# Patient Record
Sex: Male | Born: 1965 | Race: White | Hispanic: No | Marital: Single | Smoking: Never smoker
Health system: Southern US, Academic
[De-identification: ages and names within clinical notes are randomized; demographics above are authoritative.]

## PROBLEM LIST (undated history)

## (undated) DIAGNOSIS — K449 Diaphragmatic hernia without obstruction or gangrene: Secondary | ICD-10-CM

## (undated) DIAGNOSIS — H548 Legal blindness, as defined in USA: Secondary | ICD-10-CM

## (undated) DIAGNOSIS — N183 Chronic kidney disease, stage 3 unspecified: Secondary | ICD-10-CM

## (undated) DIAGNOSIS — K279 Peptic ulcer, site unspecified, unspecified as acute or chronic, without hemorrhage or perforation: Secondary | ICD-10-CM

## (undated) DIAGNOSIS — B379 Candidiasis, unspecified: Secondary | ICD-10-CM

## (undated) DIAGNOSIS — E291 Testicular hypofunction: Secondary | ICD-10-CM

## (undated) DIAGNOSIS — E1142 Type 2 diabetes mellitus with diabetic polyneuropathy: Secondary | ICD-10-CM

## (undated) DIAGNOSIS — E559 Vitamin D deficiency, unspecified: Secondary | ICD-10-CM

## (undated) DIAGNOSIS — I4891 Unspecified atrial fibrillation: Secondary | ICD-10-CM

## (undated) DIAGNOSIS — K635 Polyp of colon: Secondary | ICD-10-CM

## (undated) DIAGNOSIS — D649 Anemia, unspecified: Secondary | ICD-10-CM

## (undated) DIAGNOSIS — F17200 Nicotine dependence, unspecified, uncomplicated: Secondary | ICD-10-CM

## (undated) DIAGNOSIS — Z940 Kidney transplant status: Secondary | ICD-10-CM

## (undated) DIAGNOSIS — E119 Type 2 diabetes mellitus without complications: Secondary | ICD-10-CM

## (undated) DIAGNOSIS — K219 Gastro-esophageal reflux disease without esophagitis: Secondary | ICD-10-CM

## (undated) DIAGNOSIS — N281 Cyst of kidney, acquired: Secondary | ICD-10-CM

## (undated) DIAGNOSIS — G4733 Obstructive sleep apnea (adult) (pediatric): Secondary | ICD-10-CM

## (undated) DIAGNOSIS — E782 Mixed hyperlipidemia: Secondary | ICD-10-CM

## (undated) DIAGNOSIS — B009 Herpesviral infection, unspecified: Secondary | ICD-10-CM

## (undated) DIAGNOSIS — R972 Elevated prostate specific antigen [PSA]: Secondary | ICD-10-CM

## (undated) DIAGNOSIS — N529 Male erectile dysfunction, unspecified: Secondary | ICD-10-CM

## (undated) DIAGNOSIS — H409 Unspecified glaucoma: Secondary | ICD-10-CM

## (undated) DIAGNOSIS — E669 Obesity, unspecified: Secondary | ICD-10-CM

## (undated) HISTORY — PX: DEBRIDEMENT  FOOT: SUR387

## (undated) HISTORY — PX: COLONOSCOPY: SHX174

## (undated) HISTORY — PX: FOOT SURGERY: SHX648

## (undated) HISTORY — DX: Obesity, unspecified: E66.9

## (undated) HISTORY — DX: Herpesviral infection, unspecified: B00.9

## (undated) HISTORY — DX: Vitamin D deficiency, unspecified: E55.9

## (undated) HISTORY — DX: Type 2 diabetes mellitus with diabetic polyneuropathy: E11.42

## (undated) HISTORY — DX: Testicular hypofunction: E29.1

## (undated) HISTORY — DX: Kidney transplant status: Z94.0

## (undated) HISTORY — DX: Obstructive sleep apnea (adult) (pediatric): G47.33

## (undated) HISTORY — PX: HX RENAL TRANSPLANT: SHX64

## (undated) HISTORY — DX: Mixed hyperlipidemia: E78.2

## (undated) HISTORY — DX: Cyst of kidney, acquired: N28.1

## (undated) HISTORY — PX: HX HERNIA REPAIR: SHX51

## (undated) HISTORY — DX: Elevated prostate specific antigen (PSA): R97.20

## (undated) HISTORY — DX: Polyp of colon: K63.5

## (undated) HISTORY — DX: Unspecified glaucoma: H40.9

## (undated) HISTORY — PX: OTHER SURGICAL HISTORY: SHX170

## (undated) HISTORY — PX: HX UPPER ENDOSCOPY: 2100001144

## (undated) HISTORY — DX: Unspecified atrial fibrillation: I48.91

## (undated) HISTORY — DX: Chronic kidney disease, stage 3 unspecified: N18.30

## (undated) HISTORY — DX: Anemia, unspecified: D64.9

## (undated) HISTORY — DX: Legal blindness, as defined in USA: H54.8

## (undated) HISTORY — PX: SHOULDER SURGERY: SHX246

## (undated) HISTORY — DX: Nicotine dependence, unspecified, uncomplicated: F17.200

## (undated) HISTORY — PX: SURGERY OF LIP: SUR1315

## (undated) HISTORY — PX: EYE SURGERY: SHX253

## (undated) HISTORY — DX: Diaphragmatic hernia without obstruction or gangrene: K44.9

## (undated) HISTORY — DX: Male erectile dysfunction, unspecified: N52.9

## (undated) HISTORY — DX: Peptic ulcer, site unspecified, unspecified as acute or chronic, without hemorrhage or perforation: K27.9

## (undated) HISTORY — PX: KIDNEY SURGERY: SHX687

## (undated) HISTORY — DX: Type 2 diabetes mellitus without complications: E11.9

## (undated) HISTORY — DX: Candidiasis, unspecified: B37.9

## (undated) HISTORY — DX: Hypomagnesemia: E83.42

## (undated) HISTORY — DX: Gastro-esophageal reflux disease without esophagitis: K21.9

---

## 1993-01-31 ENCOUNTER — Other Ambulatory Visit (HOSPITAL_COMMUNITY): Payer: Self-pay

## 2014-07-13 IMAGING — US US ABDOMEN COMPLETE
1 series · 13 of 25 positions shown · non-contrast
Comparison: None.

Exam:   
Ultrasound abdomen complete
INDICATION: Biliary colic, history of renal and pancreas transplant.

[Series 1: us abdomen complete · oblique · 0.43mm/px · 13 of 60 slices shown]
[im 1/60]
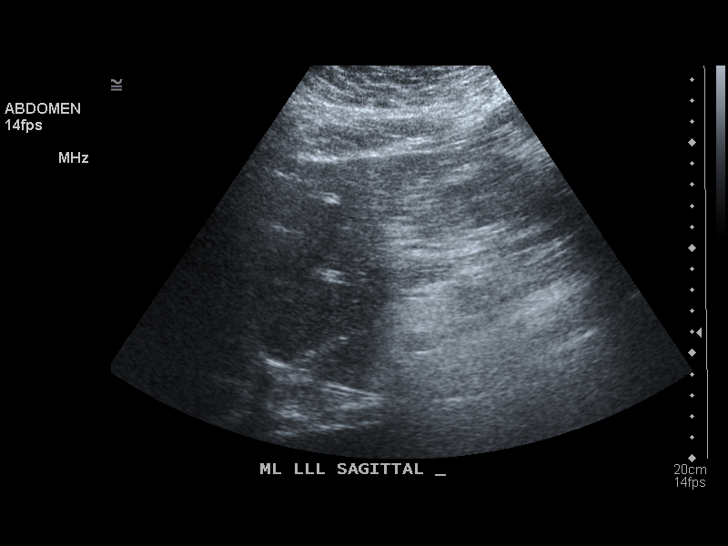
[im 5/60]
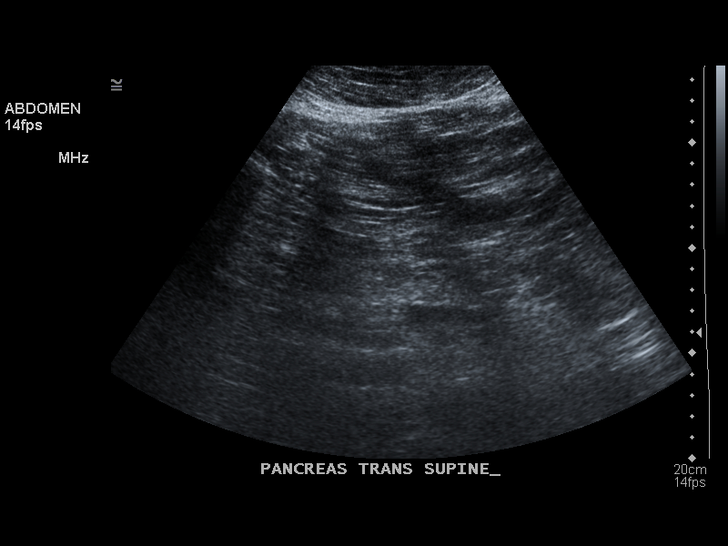
[im 10/60]
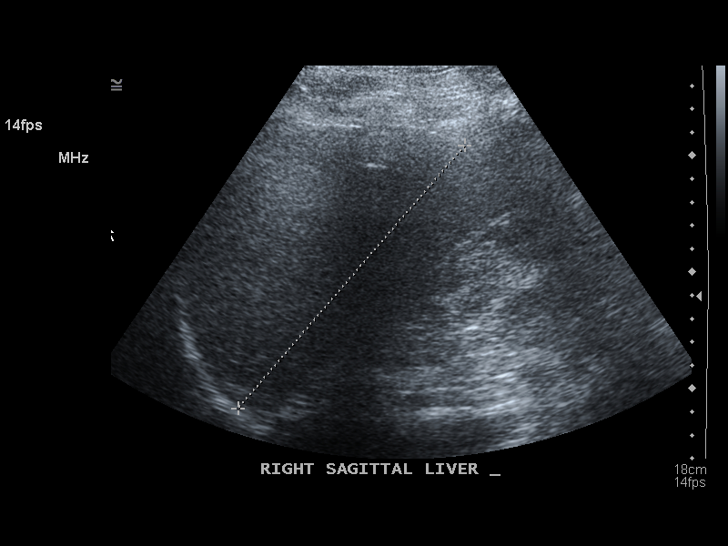
[im 15/60]
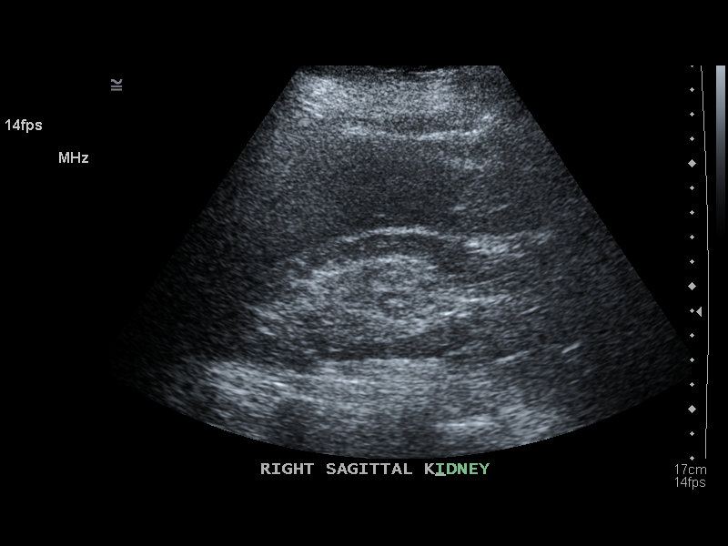
[im 20/60]
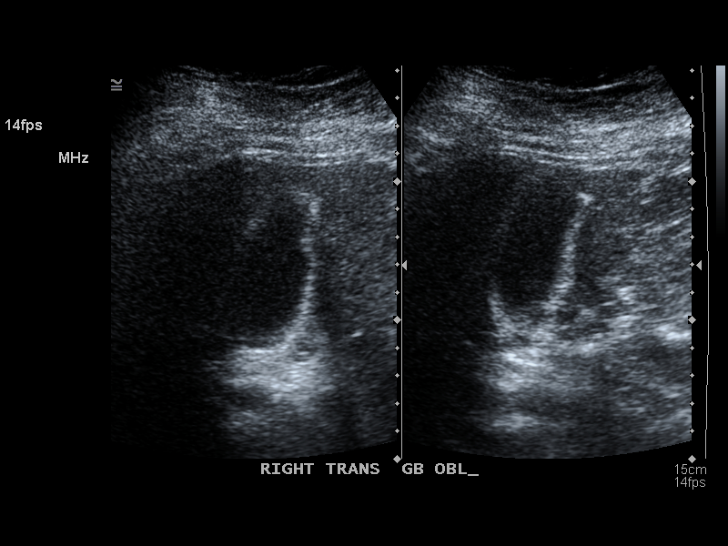
[im 25/60]
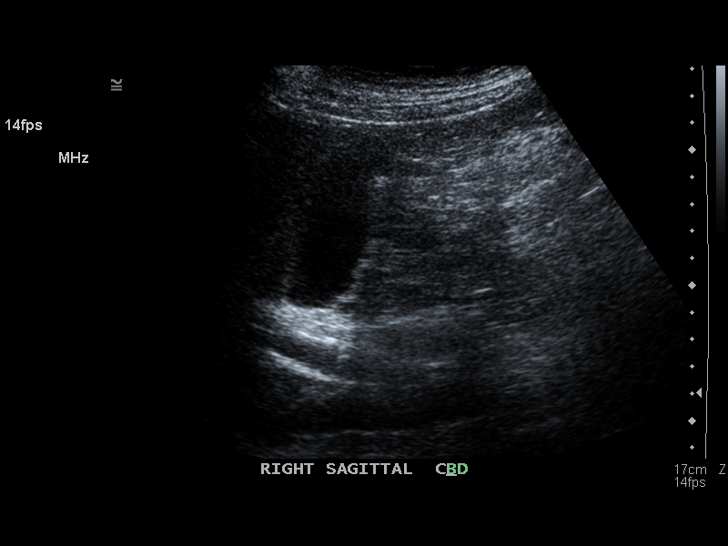
[im 30/60]
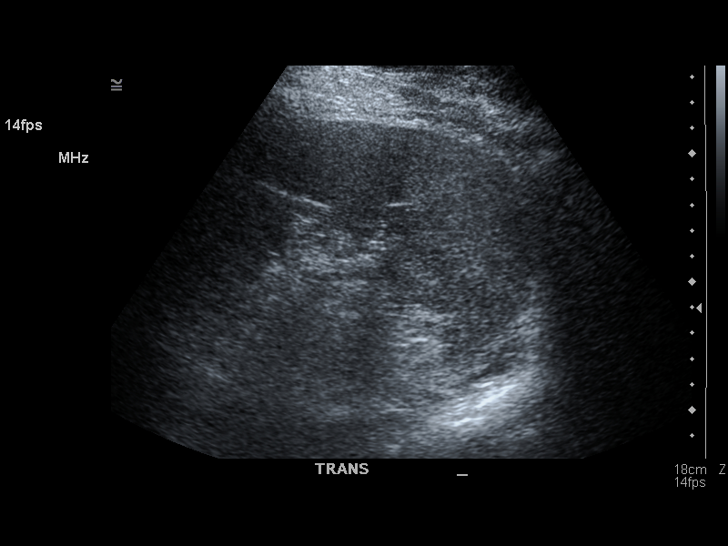
[im 35/60]
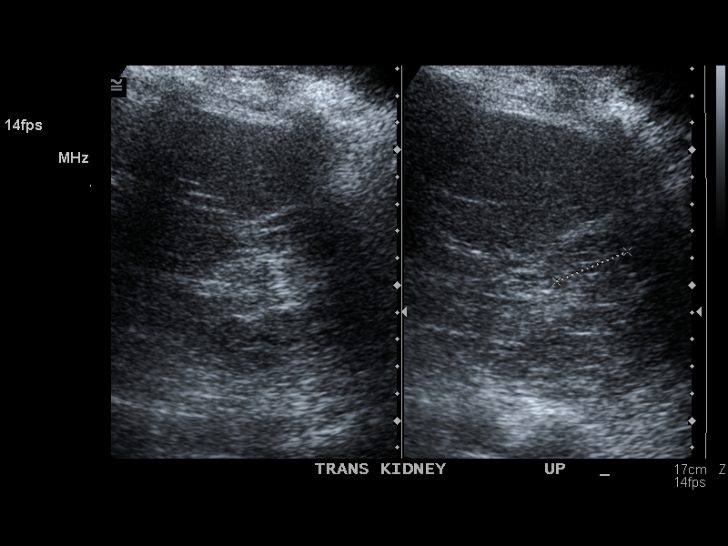
[im 40/60]
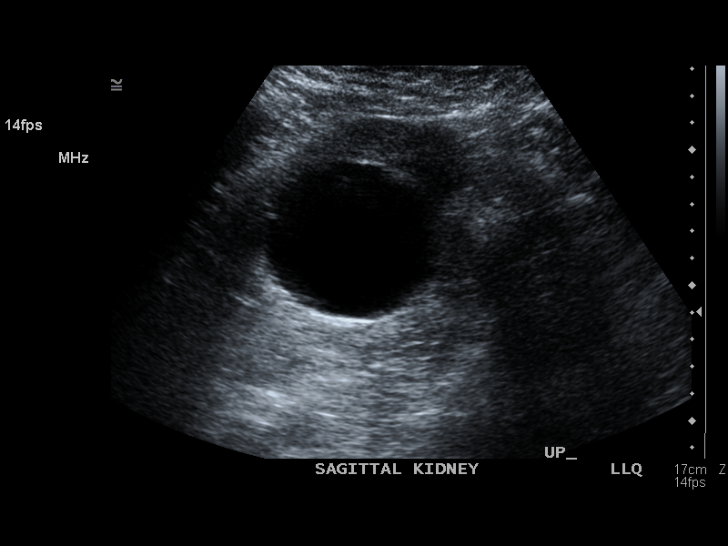
[im 45/60]
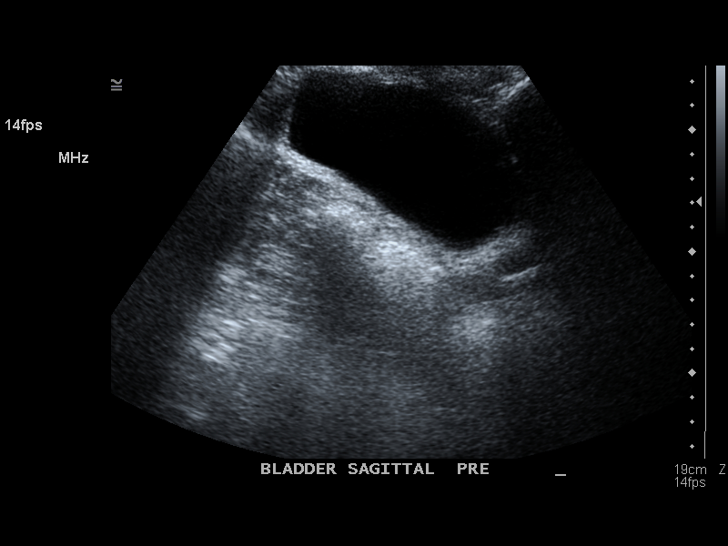
[im 50/60]
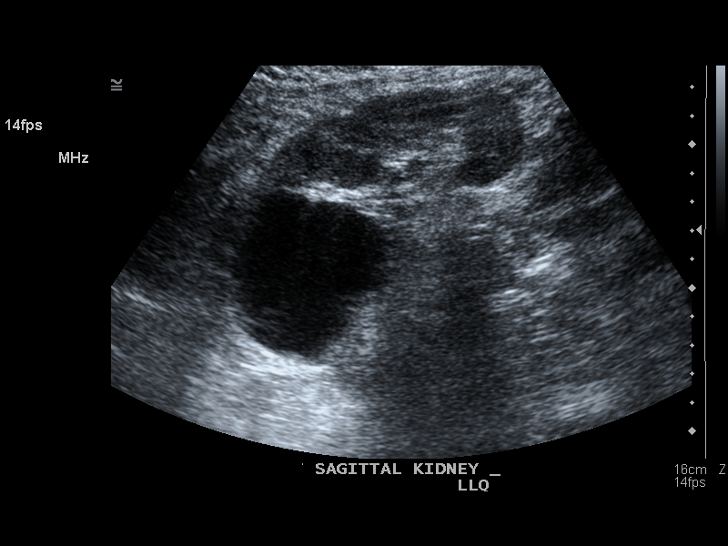
[im 55/60]
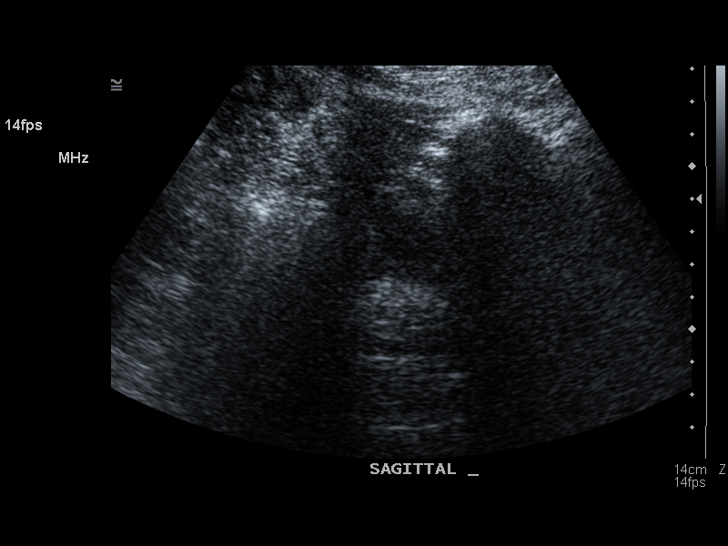
[im 60/60]
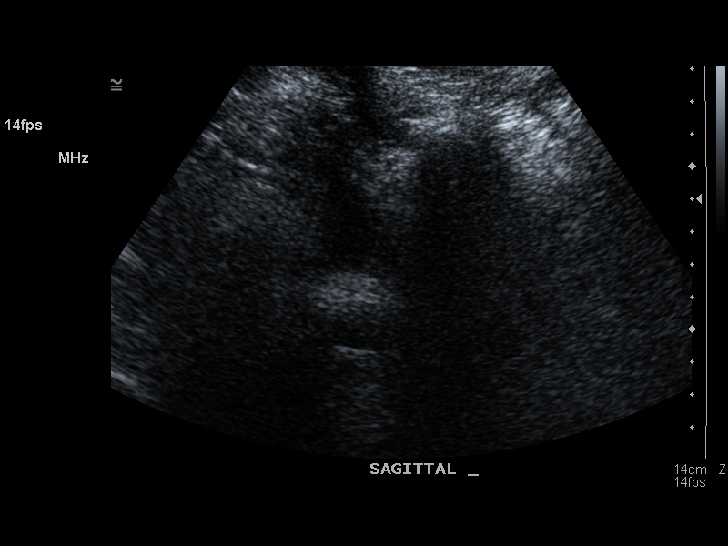

[13 of 25 positions shown; findings below may reference images not displayed]

FINDINGS: Liver is echogenic compatible with fatty infiltration. This limits evaluation for a focal hepatic mass. There is no intra or extra hepatic biliary ductal dilatation. Common bile duct measures 4 mm. No sludge or shadowing calculi are seen within the gallbladder. There is no gallbladder wall thickening or pericholecystic fluid. 
Pancreas and abdominal aorta are not well visualized due to artifact from overlying bowel gas. 
Native kidneys are echogenic consistent with medical renal disease. The left kidney is also atrophic measuring 5 cm. 
There is a transplanted kidney within the left lower quadrant measuring 13 cm. A 6.5 cm cyst is also noted within the upper pole of the transplanted kidney. 
Spleen is prominent in size measuring 13.5 cm. 
Urinary bladder is partially distended and grossly unremarkable. There is no significant post-void residual. There is no ascites.
IMPRESSION: 1.
Fatty liver. 
2.
No evidence of cholelithiasis or acute cholecystitis. 
3.
Pancreas and abdominal aorta are incompletely visualized due to artifact from overlying bowel gas. 

4.
Echogenic native kidneys consistent with medical renal disease. Left kidney is also atrophic. 
5.
Left lower quadrant transplanted kidney with a 6.5 cm upper pole cyst.

## 2021-07-15 ENCOUNTER — Encounter (RURAL_HEALTH_CENTER): Payer: Self-pay | Admitting: Internal Medicine

## 2021-07-15 DIAGNOSIS — N183 Chronic kidney disease, stage 3 unspecified (CMS HCC): Secondary | ICD-10-CM | POA: Insufficient documentation

## 2021-07-15 DIAGNOSIS — D649 Anemia, unspecified: Secondary | ICD-10-CM | POA: Insufficient documentation

## 2021-07-15 DIAGNOSIS — B379 Candidiasis, unspecified: Secondary | ICD-10-CM | POA: Insufficient documentation

## 2021-07-15 DIAGNOSIS — K219 Gastro-esophageal reflux disease without esophagitis: Secondary | ICD-10-CM | POA: Insufficient documentation

## 2021-07-15 DIAGNOSIS — Z940 Kidney transplant status: Secondary | ICD-10-CM | POA: Insufficient documentation

## 2021-07-15 DIAGNOSIS — E782 Mixed hyperlipidemia: Secondary | ICD-10-CM | POA: Insufficient documentation

## 2021-07-15 DIAGNOSIS — I4891 Unspecified atrial fibrillation: Secondary | ICD-10-CM | POA: Insufficient documentation

## 2021-07-15 DIAGNOSIS — G4733 Obstructive sleep apnea (adult) (pediatric): Secondary | ICD-10-CM | POA: Insufficient documentation

## 2021-07-15 DIAGNOSIS — N529 Male erectile dysfunction, unspecified: Secondary | ICD-10-CM | POA: Insufficient documentation

## 2021-07-15 DIAGNOSIS — E291 Testicular hypofunction: Secondary | ICD-10-CM | POA: Insufficient documentation

## 2021-07-15 DIAGNOSIS — E1142 Type 2 diabetes mellitus with diabetic polyneuropathy: Secondary | ICD-10-CM | POA: Insufficient documentation

## 2021-07-15 DIAGNOSIS — E119 Type 2 diabetes mellitus without complications: Secondary | ICD-10-CM | POA: Insufficient documentation

## 2021-07-15 NOTE — Progress Notes (Signed)
an affiliate of [facility]  Patient:  Martin Porter, Martin Porter #: [id number]  Date of Service: 06/18/21 MR #: [id number]  Provider: Rico Ala P.A.C. PCP: Rico Ala P.A.C.  DOB: 08-27-1965 Age/Sex: 55/M Referring Provider:     Internal Medicine VisitSignedHPI  Problem-AMB  Problem  Fu visit    This pt  is for  fu today  of chronic problems /DM  bs dicussed  i    No novolog  again  told  and  kepp lantus     aic   5.5%   drop to     no novolog   and  lantus   38  units  and  48 stays at pm     drop to   30 units am  and   40 units  q hs       14 events low in  last    14 days   mostly  during   12-3pm  and     0 - 3am         Discussed how this is unhealthy and we need to minimize /eliminate the low bs    Fu  GI issues fiber added last time and discussed  that it is helping       Adult Diabetic Follow-Up  Fu  Bs  see above   Cardiopulmonary symptoms: Denies dyspnea, dyspnea on exertion or lightheadedness  GI symptoms: Denies vomiting, diarrhea or constipation  Other symptoms: Denies change in vision      Intake  Vital Signs      06/18/21  08:48  Height   6 ft  Weight   334 lb  BMI   45.3  BP   130/70  Blood Pressure Location   Rt brachial  Position   Sitting  Pulse   83  Pulse Source   Monitor  Temp   97.2 F  Temp Source   Tympanic  Pulse Oximetry (%)   97  Oxygen Delivery Method   room air    Chief Complaint:  patient states he wants to talk about dm medication  having pain under right ribs and stomach.  Add today's problem/HPI: Problem-Ambulatory  Allergies    Allergy/AdvReac Type Severity Reaction Status Date / Time  No Known Allergies Allergy Severe   Verified 01/28/21 15:05      Medication List     Medication  Instructions  Recorded  Confirmed  dorzolamide 22.3 mg-timolol 6.8 1 drp EYE-BOTH BID #10 ml 08/09/19 06/18/21  mg/mL eye drops        nystatin 100,000 unit/gram topical 1 applic TOPICAL BID 0000000 g 08/29/19 06/18/21  cream        ondansetron HCl 4 mg tablet 4 mg PO Q6H #20  tab 12/23/19 06/18/21  (Zofran)        testosterone (AndroGel) 4 pump TOP BID #75 g 03/22/20 06/18/21  atropine 1 % eye drops 1 drp OPHTHALMIC (EYE) BID #15 ml 04/02/20 06/18/21  flash glucose sensor (FreeStyle #2 ea 06/13/20 06/18/21  Libre 14 Day Sensor)        pen needle, diabetic 31 gauge x #300 each 01/17/21 06/18/21  3/16" (TRUEplus Pen Needle)        iron polysacch cplx 150 mg 1 cap PO BID #180 cap 01/24/21 06/18/21  iron-vit 123456 25 mcg-folic acid 1        mg capsule (Ferrex)        sildenafil 100 mg tablet 100 mg PO .prn as  directed #30 tab 01/28/21 06/18/21  hydrochlorothiazide 25 mg tablet 25 mg PO QDAY #90 tab 02/26/21 06/18/21  mycophenolate sodium 180 mg 540 mg PO BID 90 Days #540 tab 03/08/21 06/18/21  tablet,delayed release (Myfortic)        insulin glargine 100 unit/mL (3 See Rx Instructions .ROUTE 03/15/21 06/18/21  mL) subcutaneous pen (Lantus .COMPLEX #79.2 ml      Solostar U-100 Insulin)        nystatin 100,000 unit/gram topical 1 applic TOPICAL BID [id number] g 03/21/21 06/18/21  cream        carvedilol 25 mg tablet See Rx Instructions .ROUTE 04/11/21 06/18/21    .COMPLEX #60 tab      gabapentin 800 mg tablet 800 mg PO TID #90 tab 04/18/21 06/18/21  magnesium oxide 400 mg (241.3 mg See Rx Instructions .ROUTE 05/30/21 06/18/21  magnesium) tablet .COMPLEX #60 tab      pravastatin 40 mg tablet 40 mg PO QDAY #30 tab 05/30/21 06/18/21  amlodipine 5 mg tablet See Rx Instructions .ROUTE 06/10/21 06/18/21    .COMPLEX #60 tab      atenolol 50 mg tablet See Rx Instructions .ROUTE 06/10/21 06/18/21    .COMPLEX #60 tab      fenofibrate 160 mg tablet See Rx Instructions .ROUTE 06/10/21 06/18/21    .COMPLEX #30 tab      cholecalciferol (vitamin D3) 1,250 See Rx Instructions .ROUTE 06/17/21 06/18/21  mcg (50,000 unit) capsule .COMPLEX #4 cap      lipase-protease-amylase See Rx Instructions .ROUTE 06/17/21 06/18/21  12,000-38,000-60,000 unit .COMPLEX #240 cap      capsule,delayed rel (Creon)        pantoprazole 40 mg  tablet,delayed See Rx Instructions .ROUTE 06/17/21 06/18/21  release .COMPLEX #60 tab      sulfamethoxazole 400 See Rx Instructions .ROUTE 06/17/21 06/18/21  mg-trimethoprim 80 mg tablet .COMPLEX #12 tab      famotidine 40 mg tablet (Pepcid) 40 mg PO QDAY #30 tab 06/18/21 06/18/21  potassium citrate 10 mEq (1,080 10 meq PO QDAY  tab 06/18/21 06/18/21  mg) tablet,extended release        tacrolimus 1 mg capsule, See Rx Instructions .ROUTE 06/18/21 06/18/21  immediate-release .COMPLEX #180 cap          Patient bottles, verbal confirmation, RX history and old medical records are used to get the best medication list possible.: MA- Meds Reviewed and Provider reconciled  Interpreter Required?  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  Impotence (Acute) N52.9  Hypogonadism in male (Acute) E29.1      Past Medical History (Reviewed 01/28/21 @ 15:09 by Ron Parker,  CMA)    Abdominal pain R10.9  Abnormal prostate specific antigen R97.20  CKD (chronic kidney disease) N18.9  Cyst of kidney, acquired N28.1  Diabetes E11.9  Essential hypertension I10  Glaucoma H40.9  Herpes simplex B00.9  Legal blindness H54.8  Obesity E66.9  12/18 pt would like referral for bariatric surgery, would like to be seen by [facility] because of his hx of transplant  Peptic ulcer K27.9  9/18 seen by gen surg/Reese, EGD and sono pending    gastric ulcer, no neoplasm, H pylori negative  3/18 will need to decide on f/u EGD with Dr. 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s  a few years ago pt seen by endo and given OHG had pancreatitis, thought due to the DM medicines  History of renal transplant Z94.0  1/20 pt has fu Feb 5th  12/19 Cr 1.69 GFR 45  6/19 Cr 1.41 GFR 56  3/19 Cr 1.67 GFR 46  11/18 seen by Dr. Marcy Panning, renal fx worsening, no comment on cause  11/18 Cr 1.65 GFR 47  8/18 Cr 1.95 GFR 38  6/18 Cr 1.5, Ca mildly high, SPE P/UPEPpending and asap f/u with nephrology  5/18 back to baseline cr 1.2 and GFR 68  CKD GFR 59 5/18 and was 59 11/17. Last Creat 1.36 increased from 1.2 11/17.  has proteinuria, needs current microalbumin  History of shoulder surgery Z98.890      Family History (Reviewed 01/28/21 @ 15:09 by Ron Parker, [place])  [place]ier   Deceased,  57  COPD (chronic obstructive pulmonary  disease)       black lung  DM2 (diabetes mellitus, type 2)  MOTHER   Deceased,  7  DM2 (diabetes mellitus, type 2)  MATERNAL GRANDFATHER  Hx of cholecystectomy    Social History (Reviewed 01/28/21 @ 15:09 by Ron Parker, CMA)  Tobacco Status:  none  second hand exposure:  No  substance use/type:  does not use  alcohol use/freq:  never  housing:  house  history of abuse:  none reported  current abuse:  none reported      ROS  Const  Denies body aches, Denies chills, Denies fatigue, Denies fever(s) and Denies headache(s)  Eyes  Denies change in vision and Denies diplopia  ENT  Reports system reviewed and no additional complaints, except as documented, Denies dysphagia and Denies headache(s)  Card  Denies chest pain, Denies leg edema, Denies lightheadedness, Denies dyspnea, Denies dyspnea on exertion and Denies orthopnea  Resp  Denies cough, Denies excessive phlegm production, Denies dyspnea, Denies dyspnea on exertion and Denies wheezing  GI  Denies abdominal pain, Denies change in bowel habits,  Denies constipation, Denies dysphagia, Denies heartburn, Denies diarrhea, Denies nausea and Denies vomiting  Neuro  Denies headache(s)  Endo  Denies fatigue  Aller/Immun  Denies wheezing      Exam-AMB  Const  General: cooperative, healthy appearing and no acute distress  Orientation/Consciousness: patient oriented x3  Eyes  Conjunctivae: conjunctivae normal  Sclera: sclerae normal  Pupils: PERRL  Neck  Neck: normal visual inspection and no lymphadenopathy  Thyroid: thyroid normal  Carotids: no bruits  Resp  Auscultation: clear to auscultation bilaterally, no crackles, no rales, no rhonchi and no wheezes  Cardio  Rate: regular rate  Rhythm: regular rhythm  Heart Sounds: S1 normal and S2 normal  GI  Inspection: Yes normal to inspection  Palpation: soft and no hepatosplenomegaly  Other:  + obesity      Neuro  General: patient oriented x3  Cranial Nerves: PERRL        A/P  Assessment & Plan  (1) CKD (chronic kidney disease)  stage 3, GFR 30-59 ml/min:        Status: Acute        Code(s):  N18.30 - Chronic kidney disease, stage 3 unspecified        Qualifiers:          Chronic kidney disease stage 3 subtype: stage 3b (GFR 30-44)  Qualified Code(s): N18.32 - Chronic kidney disease, stage 3b  (2) Renal transplant recipient:        Status: Acute        Plan:  FU  Yearly          Code(s):  Z94.0 - Kidney transplant status  (3) Essential hypertension:        Code(s):  I10 - Essential (primary) hypertension  (4) Type 2 diabetes mellitus without complications:        Status: None        Code(s):  E11.9 - Type 2 diabetes mellitus without complications        Qualifiers:          Diabetes mellitus long term insulin use: with long term use  Qualified Code(s): E11.9 - Type 2 diabetes mellitus without complications; [id number] - Long term (current) use of insulin  (5) Obstructive sleep apnea syndrome:        Status: None        Code(s):  G47.33 - Obstructive sleep apnea (adult) (pediatric)  (6) Atrial fibrillation:        Status: None        Code(s):  I48.91 - Unspecified atrial fibrillation        Qualifiers:          Atrial fibrillation type: unspecified  Qualified Code(s): I48.91 - Unspecified atrial fibrillation  (7) Mixed hyperlipidemia:        Status: Acute        Code(s):  E78.2 - Mixed hyperlipidemia  (8) Anemia:        Status: None        Code(s):  D64.9 - Anemia, unspecified        Qualifiers:          Anemia type: unspecified type  Qualified Code(s): D64.9 - Anemia, unspecified  (9) Vitamin D deficiency:        Code(s):  E55.9 - Vitamin D deficiency, unspecified    Plan  Meds reviewed as well as labs.  Chart reviewed and updated  Continue current treatment  Keep follow-up appointment    gi increase fiber  to  bid   and  see if  ruq  better  also spread out   creaon  pre meals    increase pepcid   40mg    and  may need GB work up       bs dicussed  see hpi    No novolog  again  told  and  kepp lantus     aic   5.5%   drop to     no novolog   and   lantus   38  units  and  48 stays at pm     drop to   30 units am  and   40 units  q hs       14 events low in  last    14 days   mostly  during   12-3pm  and     0 - 3am         see above  changes      needs  covid  vaccine    needs flu shot  eye appt yearly   needs nephrology  fu     Diet exercise and weight loss has been encouraged  Transplant pt  fu was in March   2022  rx  shingrix and prevnar         Medications:  New  famotidine (Pepcid) 40 mg PO QDAY 30 tabs 3RF      Changed  From tacrolimus  TAKE THREE CAPSULES BY MOUTH TWICE A DAY FOR KIDNEY TRANSPLANT 180 caps 3RF Z94.0 - Kidney transplant status    To tacrolimus TAKE THREE CAPSULES BY MOUTH TWICE A DAY FOR KIDNEY TRANSPLANT 180 caps 3RF Z94.0 - Kidney transplant status    Discontinued  famotidine     Discontinued Reason:  Order Change  TAKE ONE TABLET BY MOUTH EVERY DAY 90 tabs 1RF      insulin aspart U-100 (Novolog FlexPen U-100 Insulin aspart)     Discontinued Reason:  Order Change  INJECT 80 UNITS ONCE A DAY FOR 90 DAYS 216 mL 1RF        Please follow up in Follow Up:      4 Weeks    QPP  Document patient's colon cancer screening date in health mgt  Colon Cancer Tests:       No Data to Display    Smoking risk assessment performed?: Yes  Over age 34 do PHQ2  Over the last 2 weeks, how often have you been bothered by any of the following problems?  1. Little interest or pleasure in doing things: not at all  2. Feeling down, depressed, or hopeless: not at all  PHQ-2 Adult: Total score: 0  Screen results-Adult: negative        Signed By:<Electronically signed by  Rico Ala P.A.C.>06/23/21 2024

## 2021-07-17 ENCOUNTER — Other Ambulatory Visit (RURAL_HEALTH_CENTER): Payer: Self-pay | Admitting: Internal Medicine

## 2021-07-17 MED ORDER — INSULIN GLARGINE (U-100) 100 UNIT/ML (3 ML) SUBCUTANEOUS PEN
95.0000 [IU] | PEN_INJECTOR | Freq: Every evening | SUBCUTANEOUS | 3 refills | Status: DC
Start: 2021-07-17 — End: 2021-09-10

## 2021-07-18 ENCOUNTER — Other Ambulatory Visit (RURAL_HEALTH_CENTER): Payer: Self-pay | Admitting: Internal Medicine

## 2021-07-19 ENCOUNTER — Ambulatory Visit (RURAL_HEALTH_CENTER): Payer: Self-pay | Admitting: Internal Medicine

## 2021-07-22 ENCOUNTER — Ambulatory Visit (RURAL_HEALTH_CENTER): Payer: Medicare Other | Admitting: Internal Medicine

## 2021-08-02 ENCOUNTER — Encounter (RURAL_HEALTH_CENTER): Payer: Self-pay | Admitting: Internal Medicine

## 2021-08-05 ENCOUNTER — Ambulatory Visit (RURAL_HEALTH_CENTER): Payer: Medicare Other | Admitting: Internal Medicine

## 2021-08-06 ENCOUNTER — Other Ambulatory Visit (RURAL_HEALTH_CENTER): Payer: Self-pay | Admitting: Internal Medicine

## 2021-08-08 ENCOUNTER — Other Ambulatory Visit (RURAL_HEALTH_CENTER): Payer: Self-pay | Admitting: Internal Medicine

## 2021-08-08 MED ORDER — SILDENAFIL 100 MG TABLET
100.0000 mg | ORAL_TABLET | ORAL | 2 refills | Status: DC | PRN
Start: 2021-08-08 — End: 2021-09-10

## 2021-08-08 MED ORDER — CREON 12,000-38,000-60,000 UNIT CAPSULE,DELAYED RELEASE
DELAYED_RELEASE_CAPSULE | ORAL | 3 refills | Status: DC
Start: 2021-08-08 — End: 2022-01-20

## 2021-08-16 ENCOUNTER — Other Ambulatory Visit (RURAL_HEALTH_CENTER): Payer: Self-pay | Admitting: Internal Medicine

## 2021-08-19 ENCOUNTER — Other Ambulatory Visit (RURAL_HEALTH_CENTER): Payer: Self-pay | Admitting: Internal Medicine

## 2021-08-19 MED ORDER — CARVEDILOL 25 MG TABLET
25.0000 mg | ORAL_TABLET | Freq: Two times a day (BID) | ORAL | 2 refills | Status: DC
Start: 2021-08-19 — End: 2022-05-09

## 2021-09-03 ENCOUNTER — Other Ambulatory Visit (RURAL_HEALTH_CENTER): Payer: Self-pay | Admitting: Internal Medicine

## 2021-09-04 ENCOUNTER — Inpatient Hospital Stay
Admission: EM | Admit: 2021-09-04 | Discharge: 2021-09-10 | DRG: 571 | Disposition: A | Discharge status: Home Health | Payer: Medicare Other | Attending: Internal Medicine | Admitting: Internal Medicine

## 2021-09-04 ENCOUNTER — Encounter (HOSPITAL_BASED_OUTPATIENT_CLINIC_OR_DEPARTMENT_OTHER): Payer: Self-pay

## 2021-09-04 ENCOUNTER — Inpatient Hospital Stay (HOSPITAL_COMMUNITY): Payer: Medicare Other | Admitting: Emergency Medicine

## 2021-09-04 ENCOUNTER — Other Ambulatory Visit: Payer: Self-pay

## 2021-09-04 DIAGNOSIS — L02415 Cutaneous abscess of right lower limb: Secondary | ICD-10-CM

## 2021-09-04 DIAGNOSIS — E1165 Type 2 diabetes mellitus with hyperglycemia: Secondary | ICD-10-CM | POA: Diagnosis present

## 2021-09-04 DIAGNOSIS — D649 Anemia, unspecified: Secondary | ICD-10-CM | POA: Diagnosis present

## 2021-09-04 DIAGNOSIS — Z6841 Body Mass Index (BMI) 40.0 and over, adult: Secondary | ICD-10-CM

## 2021-09-04 DIAGNOSIS — N179 Acute kidney failure, unspecified: Secondary | ICD-10-CM | POA: Diagnosis present

## 2021-09-04 DIAGNOSIS — E782 Mixed hyperlipidemia: Secondary | ICD-10-CM | POA: Diagnosis present

## 2021-09-04 DIAGNOSIS — Z794 Long term (current) use of insulin: Secondary | ICD-10-CM

## 2021-09-04 DIAGNOSIS — L03115 Cellulitis of right lower limb: Principal | ICD-10-CM | POA: Diagnosis present

## 2021-09-04 DIAGNOSIS — K760 Fatty (change of) liver, not elsewhere classified: Secondary | ICD-10-CM | POA: Diagnosis present

## 2021-09-04 DIAGNOSIS — K219 Gastro-esophageal reflux disease without esophagitis: Secondary | ICD-10-CM | POA: Diagnosis present

## 2021-09-04 DIAGNOSIS — K449 Diaphragmatic hernia without obstruction or gangrene: Secondary | ICD-10-CM | POA: Diagnosis present

## 2021-09-04 DIAGNOSIS — Z87891 Personal history of nicotine dependence: Secondary | ICD-10-CM

## 2021-09-04 DIAGNOSIS — Z79624 Long term (current) use of inhibitors of nucleotide synthesis: Secondary | ICD-10-CM

## 2021-09-04 DIAGNOSIS — R609 Edema, unspecified: Secondary | ICD-10-CM

## 2021-09-04 DIAGNOSIS — K279 Peptic ulcer, site unspecified, unspecified as acute or chronic, without hemorrhage or perforation: Secondary | ICD-10-CM | POA: Diagnosis present

## 2021-09-04 DIAGNOSIS — H409 Unspecified glaucoma: Secondary | ICD-10-CM | POA: Diagnosis present

## 2021-09-04 DIAGNOSIS — I129 Hypertensive chronic kidney disease with stage 1 through stage 4 chronic kidney disease, or unspecified chronic kidney disease: Secondary | ICD-10-CM | POA: Diagnosis present

## 2021-09-04 DIAGNOSIS — L0390 Cellulitis, unspecified: Secondary | ICD-10-CM | POA: Diagnosis present

## 2021-09-04 DIAGNOSIS — L89619 Pressure ulcer of right heel, unspecified stage: Secondary | ICD-10-CM | POA: Diagnosis present

## 2021-09-04 DIAGNOSIS — Z5320 Procedure and treatment not carried out because of patient's decision for unspecified reasons: Secondary | ICD-10-CM | POA: Diagnosis present

## 2021-09-04 DIAGNOSIS — R109 Unspecified abdominal pain: Secondary | ICD-10-CM

## 2021-09-04 DIAGNOSIS — J449 Chronic obstructive pulmonary disease, unspecified: Secondary | ICD-10-CM | POA: Diagnosis present

## 2021-09-04 DIAGNOSIS — E1142 Type 2 diabetes mellitus with diabetic polyneuropathy: Secondary | ICD-10-CM | POA: Diagnosis present

## 2021-09-04 DIAGNOSIS — Z79899 Other long term (current) drug therapy: Secondary | ICD-10-CM

## 2021-09-04 DIAGNOSIS — E1122 Type 2 diabetes mellitus with diabetic chronic kidney disease: Secondary | ICD-10-CM | POA: Diagnosis present

## 2021-09-04 DIAGNOSIS — I4891 Unspecified atrial fibrillation: Secondary | ICD-10-CM | POA: Diagnosis present

## 2021-09-04 DIAGNOSIS — H548 Legal blindness, as defined in USA: Secondary | ICD-10-CM | POA: Diagnosis present

## 2021-09-04 DIAGNOSIS — E559 Vitamin D deficiency, unspecified: Secondary | ICD-10-CM | POA: Diagnosis present

## 2021-09-04 DIAGNOSIS — G4730 Sleep apnea, unspecified: Secondary | ICD-10-CM | POA: Diagnosis present

## 2021-09-04 DIAGNOSIS — G4733 Obstructive sleep apnea (adult) (pediatric): Secondary | ICD-10-CM | POA: Diagnosis present

## 2021-09-04 DIAGNOSIS — T8619 Other complication of kidney transplant: Secondary | ICD-10-CM | POA: Diagnosis present

## 2021-09-04 DIAGNOSIS — N183 Chronic kidney disease, stage 3 unspecified: Secondary | ICD-10-CM | POA: Diagnosis present

## 2021-09-04 DIAGNOSIS — E871 Hypo-osmolality and hyponatremia: Secondary | ICD-10-CM | POA: Diagnosis present

## 2021-09-04 DIAGNOSIS — Z940 Kidney transplant status: Secondary | ICD-10-CM

## 2021-09-04 NOTE — ED Triage Notes (Addendum)
Per EMS, patient c/o LLQ abdominal pain "near where he had his kidney transplant". C/O nausea, vomiting, and diarrhea. Also c/o he cut his leg on a can yesterday and now the right lower leg is red, hot, and swollen. Patient has an area to the right heel that is draining foul-smelling brown liquid.

## 2021-09-05 ENCOUNTER — Emergency Department (HOSPITAL_BASED_OUTPATIENT_CLINIC_OR_DEPARTMENT_OTHER): Payer: Medicare Other

## 2021-09-05 ENCOUNTER — Inpatient Hospital Stay (HOSPITAL_COMMUNITY): Payer: Medicare Other

## 2021-09-05 ENCOUNTER — Encounter (HOSPITAL_BASED_OUTPATIENT_CLINIC_OR_DEPARTMENT_OTHER): Payer: Self-pay | Admitting: Emergency Medicine

## 2021-09-05 DIAGNOSIS — R1032 Left lower quadrant pain: Secondary | ICD-10-CM

## 2021-09-05 DIAGNOSIS — L0390 Cellulitis, unspecified: Secondary | ICD-10-CM | POA: Diagnosis present

## 2021-09-05 DIAGNOSIS — I447 Left bundle-branch block, unspecified: Secondary | ICD-10-CM

## 2021-09-05 DIAGNOSIS — Y830 Surgical operation with transplant of whole organ as the cause of abnormal reaction of the patient, or of later complication, without mention of misadventure at the time of the procedure: Secondary | ICD-10-CM

## 2021-09-05 DIAGNOSIS — R109 Unspecified abdominal pain: Secondary | ICD-10-CM

## 2021-09-05 DIAGNOSIS — E1122 Type 2 diabetes mellitus with diabetic chronic kidney disease: Secondary | ICD-10-CM

## 2021-09-05 DIAGNOSIS — N1831 Chronic kidney disease, stage 3a (CMS HCC): Secondary | ICD-10-CM

## 2021-09-05 DIAGNOSIS — T8619 Other complication of kidney transplant: Secondary | ICD-10-CM

## 2021-09-05 DIAGNOSIS — E785 Hyperlipidemia, unspecified: Secondary | ICD-10-CM

## 2021-09-05 DIAGNOSIS — I129 Hypertensive chronic kidney disease with stage 1 through stage 4 chronic kidney disease, or unspecified chronic kidney disease: Secondary | ICD-10-CM

## 2021-09-05 DIAGNOSIS — N179 Acute kidney failure, unspecified: Secondary | ICD-10-CM

## 2021-09-05 LAB — C-REACTIVE PROTEIN(CRP),INFLAMMATION: C-REACTIVE PROTEIN (CRP): 49.9 mg/dL — ABNORMAL HIGH (ref <=0.8)

## 2021-09-05 LAB — CBC WITH DIFF
HCT: 25.9 % — ABNORMAL LOW (ref 42.0–51.0)
HGB: 8.5 g/dL — ABNORMAL LOW (ref 13.5–18.0)
MCH: 31.3 pg (ref 27.0–32.0)
MCHC: 33 g/dL (ref 32.0–36.0)
MCV: 94.8 fL (ref 78.0–99.0)
MPV: 10.6 fL — ABNORMAL HIGH (ref 7.4–10.4)
PLATELETS: 232 10*3/uL (ref 140–440)
RBC: 2.73 10*6/uL — ABNORMAL LOW (ref 4.20–6.00)
RDW: 14.9 % — ABNORMAL HIGH (ref 11.6–14.8)
WBC: 12.1 10*3/uL — ABNORMAL HIGH (ref 4.0–10.5)

## 2021-09-05 LAB — MANUAL DIFFERENTIAL
BAND %: 2 % — ABNORMAL LOW (ref 5–11)
BANDS NEUTROPHILS MANUAL: 2
EOSINOPHIL %: 2 % (ref 0–7)
EOSINOPHIL ABSOLUTE: 0.24 10*3/uL (ref 0.00–0.80)
EOSINOPHILS MANUAL: 2
LYMPHOCYTE %: 10 % — ABNORMAL LOW (ref 25–45)
LYMPHOCYTE ABSOLUTE: 1.21 10*3/uL (ref 1.10–5.00)
LYMPHOCYTES MANUAL: 10
MONOCYTE %: 7 % (ref 0–12)
MONOCYTE ABSOLUTE: 0.85 10*3/uL (ref 0.00–1.30)
MONOCYTES MANUAL: 7
NEUTROPHIL %: 79 % — ABNORMAL HIGH (ref 40–76)
NEUTROPHIL ABSOLUTE: 9.8 10*3/uL — ABNORMAL HIGH (ref 1.80–8.40)
NEUTROPHILS MANUAL: 79
PLATELET MORPHOLOGY COMMENT: NORMAL
RBC MORPHOLOGY COMMENT: NORMAL
TOTAL CELLS COUNTED [#] IN BLOOD: 100
WBC: 12.1 10*3/uL

## 2021-09-05 LAB — URINALYSIS, MACRO/MICRO
BLOOD: NEGATIVE mg/dL
GLUCOSE: NEGATIVE mg/dL
KETONES: NEGATIVE mg/dL
LEUKOCYTES: NEGATIVE WBCs/uL
NITRITE: NEGATIVE
PH: 5.5 (ref 4.6–8.0)
PROTEIN: NEGATIVE mg/dL
SPECIFIC GRAVITY: 1.025 (ref 1.003–1.035)
UROBILINOGEN: 0.2 mg/dL (ref 0.2–1.0)

## 2021-09-05 LAB — ECG 12 LEAD
Atrial Rate: 87 {beats}/min
Calculated P Axis: 67 degrees
Calculated R Axis: -22 degrees
Calculated T Axis: 107 degrees
PR Interval: 148 ms
QRS Duration: 144 ms
QT Interval: 396 ms
QTC Calculation: 476 ms
Ventricular rate: 87 {beats}/min

## 2021-09-05 LAB — PT/INR
INR: 1.36 — ABNORMAL HIGH (ref 0.88–1.10)
PROTHROMBIN TIME: 14.5 seconds — ABNORMAL HIGH (ref 9.2–12.1)

## 2021-09-05 LAB — COVID-19, FLU A/B, RSV RAPID BY PCR
INFLUENZA VIRUS TYPE A: NOT DETECTED
INFLUENZA VIRUS TYPE B: NOT DETECTED
RESPIRATORY SYNCTIAL VIRUS (RSV): NOT DETECTED
SARS-CoV-2: NOT DETECTED

## 2021-09-05 LAB — COMPREHENSIVE METABOLIC PANEL, NON-FASTING
ALBUMIN/GLOBULIN RATIO: 0.6 — ABNORMAL LOW (ref 0.8–1.4)
ALBUMIN: 2.6 g/dL — ABNORMAL LOW (ref 3.4–5.0)
ALKALINE PHOSPHATASE: 73 U/L (ref 46–116)
ALT (SGPT): 26 U/L (ref <=78)
ANION GAP: 13 mmol/L (ref 10–20)
AST (SGOT): 28 U/L (ref 15–37)
BILIRUBIN TOTAL: 0.7 mg/dL (ref 0.2–1.0)
BUN/CREA RATIO: 13
BUN: 59 mg/dL — ABNORMAL HIGH (ref 7–18)
CALCIUM, CORRECTED: 10.6 mg/dL
CALCIUM: 9.2 mg/dL (ref 8.5–10.1)
CHLORIDE: 95 mmol/L — ABNORMAL LOW (ref 98–107)
CO2 TOTAL: 24 mmol/L (ref 21–32)
CREATININE: 4.42 mg/dL — ABNORMAL HIGH (ref 0.70–1.30)
ESTIMATED GFR: 15 mL/min/{1.73_m2} — ABNORMAL LOW (ref >59)
GLOBULIN: 4.1
GLUCOSE: 124 mg/dL — ABNORMAL HIGH (ref 74–106)
OSMOLALITY, CALCULATED: 283 mOsm/kg (ref 270–290)
POTASSIUM: 4.2 mmol/L (ref 3.5–5.1)
PROTEIN TOTAL: 6.7 g/dL (ref 6.4–8.2)
SODIUM: 132 mmol/L — ABNORMAL LOW (ref 136–145)

## 2021-09-05 LAB — NT-PROBNP: NT-PROBNP: 2547 pg/mL — ABNORMAL HIGH (ref <=125)

## 2021-09-05 LAB — POC BLOOD GLUCOSE (RESULTS)
GLUCOSE, POC: 122 mg/dl (ref 50–500)
GLUCOSE, POC: 150 mg/dl (ref 50–500)
GLUCOSE, POC: 167 mg/dl (ref 50–500)

## 2021-09-05 LAB — LACTIC ACID LEVEL W/ REFLEX FOR LEVEL >2.0: LACTIC ACID: 1.3 mmol/L (ref 0.4–2.0)

## 2021-09-05 LAB — PTT (PARTIAL THROMBOPLASTIN TIME): APTT: 34.1 seconds — ABNORMAL HIGH (ref 22.4–31.7)

## 2021-09-05 LAB — MAGNESIUM: MAGNESIUM: 1.9 mg/dL (ref 1.8–2.4)

## 2021-09-05 LAB — LIPASE: LIPASE: 102 U/L (ref 73–393)

## 2021-09-05 MED ORDER — INSULIN GLARGINE 100 UNITS/ML SUBQ - CHARGE BY DOSE
90.0000 [IU] | Freq: Every evening | SUBCUTANEOUS | Status: DC
Start: 2021-09-05 — End: 2021-09-05
  Administered 2021-09-05: 0 [IU] via SUBCUTANEOUS

## 2021-09-05 MED ORDER — FAMOTIDINE 20 MG TABLET
ORAL_TABLET | ORAL | Status: AC
Start: 2021-09-05 — End: 2021-09-05
  Filled 2021-09-05: qty 2

## 2021-09-05 MED ORDER — CLINDAMYCIN 900 MG/50 ML IN 0.9% SODIUM CHLORIDE INTRAVENOUS PIGGYBACK
900.0000 mg | INJECTION | INTRAVENOUS | Status: AC
Start: 2021-09-05 — End: 2021-09-05
  Administered 2021-09-05: 900 mg via INTRAVENOUS
  Administered 2021-09-05: 0 mg via INTRAVENOUS

## 2021-09-05 MED ORDER — HYDROCODONE 5 MG-ACETAMINOPHEN 325 MG TABLET
1.0000 | ORAL_TABLET | ORAL | Status: AC
Start: 2021-09-05 — End: 2021-09-05
  Administered 2021-09-05: 1 via ORAL

## 2021-09-05 MED ORDER — VANCOMYCIN IV - PHARMACIST TO DOSE PER PROTOCOL
Freq: Every day | Status: DC | PRN
Start: 2021-09-05 — End: 2021-09-05

## 2021-09-05 MED ORDER — MAGNESIUM OXIDE 400 MG (241.3 MG MAGNESIUM) TABLET
ORAL_TABLET | ORAL | Status: AC
Start: 2021-09-05 — End: 2021-09-05
  Filled 2021-09-05: qty 1

## 2021-09-05 MED ORDER — HYDROCHLOROTHIAZIDE 25 MG TABLET
ORAL_TABLET | ORAL | Status: AC
Start: 2021-09-05 — End: 2021-09-05
  Filled 2021-09-05: qty 1

## 2021-09-05 MED ORDER — LIPASE-PROTEASE-AMYLASE 12,000-38,000-60,000 UNIT CAPSULE,DELAYED REL
4.0000 | DELAYED_RELEASE_CAPSULE | Freq: Two times a day (BID) | ORAL | Status: AC
Start: 2021-09-06 — End: 2021-09-10
  Administered 2021-09-06 – 2021-09-07 (×3): 4 via ORAL
  Administered 2021-09-07: 0 via ORAL
  Administered 2021-09-08 – 2021-09-09 (×4): 4 via ORAL
  Filled 2021-09-05 (×3): qty 4
  Filled 2021-09-05: qty 1
  Filled 2021-09-05 (×3): qty 4
  Filled 2021-09-05: qty 1

## 2021-09-05 MED ORDER — ONDANSETRON HCL (PF) 4 MG/2 ML INJECTION SOLUTION
INTRAMUSCULAR | Status: AC
Start: 2021-09-05 — End: 2021-09-05
  Filled 2021-09-05: qty 2

## 2021-09-05 MED ORDER — CEFTRIAXONE 2 GRAM SOLUTION FOR INJECTION
INTRAMUSCULAR | Status: AC
Start: 2021-09-05 — End: 2021-09-05
  Filled 2021-09-05: qty 20

## 2021-09-05 MED ORDER — PRAVASTATIN 40 MG TABLET
40.0000 mg | ORAL_TABLET | Freq: Every evening | ORAL | Status: DC
Start: 2021-09-05 — End: 2021-09-10
  Administered 2021-09-05 – 2021-09-09 (×5): 40 mg via ORAL
  Filled 2021-09-05 (×5): qty 1

## 2021-09-05 MED ORDER — PRAVASTATIN 40 MG TABLET
40.0000 mg | ORAL_TABLET | Freq: Every evening | ORAL | Status: DC
Start: 2021-09-05 — End: 2021-09-05
  Filled 2021-09-05: qty 1

## 2021-09-05 MED ORDER — SODIUM CHLORIDE 0.9 % INTRAVENOUS PIGGYBACK
2.0000 g | INTRAVENOUS | Status: AC
Start: 2021-09-05 — End: 2021-09-05
  Administered 2021-09-05: 2 g via INTRAVENOUS
  Administered 2021-09-05: 0 g via INTRAVENOUS

## 2021-09-05 MED ORDER — CLINDAMYCIN 900 MG/50 ML IN 0.9% SODIUM CHLORIDE INTRAVENOUS PIGGYBACK
INJECTION | INTRAVENOUS | Status: AC
Start: 2021-09-05 — End: 2021-09-05
  Filled 2021-09-05: qty 50

## 2021-09-05 MED ORDER — VANCOMYCIN IV - PHARMACIST TO DOSE PER PROTOCOL
Freq: Every day | Status: DC | PRN
Start: 2021-09-05 — End: 2021-09-08

## 2021-09-05 MED ORDER — HEPARIN (PORCINE) 5,000 UNIT/ML INJECTION SOLUTION
5000.0000 [IU] | Freq: Three times a day (TID) | INTRAMUSCULAR | Status: DC
Start: 2021-09-05 — End: 2021-09-06
  Administered 2021-09-05 – 2021-09-06 (×3): 5000 [IU] via SUBCUTANEOUS
  Filled 2021-09-05 (×4): qty 1

## 2021-09-05 MED ORDER — LACTATED RINGERS INTRAVENOUS SOLUTION
INTRAVENOUS | Status: DC
Start: 2021-09-05 — End: 2021-09-05
  Administered 2021-09-05: 0 mL via INTRAVENOUS

## 2021-09-05 MED ORDER — ONDANSETRON HCL (PF) 4 MG/2 ML INJECTION SOLUTION
4.0000 mg | INTRAMUSCULAR | Status: AC
Start: 2021-09-05 — End: 2021-09-05
  Administered 2021-09-05: 4 mg via INTRAVENOUS

## 2021-09-05 MED ORDER — GLUCAGON 1 MG/ML SOLUTION FOR INJECTION
1.0000 mg | INTRAMUSCULAR | Status: DC | PRN
Start: 2021-09-05 — End: 2021-09-10
  Filled 2021-09-05: qty 1

## 2021-09-05 MED ORDER — CHOLECALCIFEROL (VITAMIN D3) 25 MCG (1,000 UNIT) TABLET
1000.0000 [IU] | ORAL_TABLET | Freq: Every day | ORAL | Status: DC
Start: 2021-09-05 — End: 2021-09-10
  Administered 2021-09-05 – 2021-09-06 (×2): 1000 [IU] via ORAL
  Administered 2021-09-07: 0 [IU] via ORAL
  Administered 2021-09-08 – 2021-09-10 (×3): 1000 [IU] via ORAL
  Filled 2021-09-05 (×6): qty 1

## 2021-09-05 MED ORDER — SULFAMETHOXAZOLE 200 MG-TRIMETHOPRIM 40 MG/5 ML ORAL SUSPENSION
80.0000 mg | ORAL | Status: AC
Start: 2021-09-05 — End: 2021-09-10
  Administered 2021-09-05 – 2021-09-09 (×4): 0 mg via ORAL
  Filled 2021-09-05 (×2): qty 20

## 2021-09-05 MED ORDER — IPRATROPIUM 0.5 MG-ALBUTEROL 3 MG (2.5 MG BASE)/3 ML NEBULIZATION SOLN
3.0000 mL | INHALATION_SOLUTION | RESPIRATORY_TRACT | Status: DC | PRN
Start: 2021-09-05 — End: 2021-09-10

## 2021-09-05 MED ORDER — PANTOPRAZOLE 40 MG TABLET,DELAYED RELEASE
40.0000 mg | DELAYED_RELEASE_TABLET | Freq: Every day | ORAL | Status: DC
Start: 2021-09-05 — End: 2021-09-10
  Administered 2021-09-05: 0 mg via ORAL
  Administered 2021-09-06: 40 mg via ORAL
  Administered 2021-09-07: 0 mg via ORAL
  Administered 2021-09-08 – 2021-09-10 (×3): 40 mg via ORAL
  Filled 2021-09-05 (×5): qty 1

## 2021-09-05 MED ORDER — INSULIN REGULAR HUMAN 100 UNIT/ML INJECTION SSIP
0.0000 [IU] | INJECTION | Freq: Four times a day (QID) | SUBCUTANEOUS | Status: DC | PRN
Start: 2021-09-05 — End: 2021-09-10
  Administered 2021-09-05: 3 [IU] via SUBCUTANEOUS
  Administered 2021-09-06 (×2): 6 [IU] via SUBCUTANEOUS
  Administered 2021-09-06: 9 [IU] via SUBCUTANEOUS
  Administered 2021-09-06: 3 [IU] via SUBCUTANEOUS
  Administered 2021-09-07 (×3): 9 [IU] via SUBCUTANEOUS
  Administered 2021-09-08 – 2021-09-09 (×3): 6 [IU] via SUBCUTANEOUS
  Filled 2021-09-05: qty 18
  Filled 2021-09-05: qty 12
  Filled 2021-09-05: qty 27
  Filled 2021-09-05 (×2): qty 18
  Filled 2021-09-05: qty 9
  Filled 2021-09-05: qty 18
  Filled 2021-09-05: qty 9
  Filled 2021-09-05 (×3): qty 27

## 2021-09-05 MED ORDER — ATROPINE 1 % EYE DROPS
1.0000 [drp] | Freq: Four times a day (QID) | OPHTHALMIC | Status: AC
Start: 2021-09-05 — End: 2021-09-06
  Administered 2021-09-05: 0 [drp] via OPHTHALMIC
  Administered 2021-09-05 (×2): 1 [drp] via OPHTHALMIC
  Administered 2021-09-05: 0 [drp] via OPHTHALMIC
  Administered 2021-09-06: 1 [drp] via OPHTHALMIC
  Filled 2021-09-05 (×3): qty 5

## 2021-09-05 MED ORDER — TACROLIMUS 1 MG CAPSULE, IMMEDIATE-RELEASE
1.0000 mg | ORAL_CAPSULE | Freq: Every morning | ORAL | Status: DC
Start: 2021-09-05 — End: 2021-09-05
  Administered 2021-09-05: 1 mg via ORAL
  Filled 2021-09-05 (×2): qty 1

## 2021-09-05 MED ORDER — VANCOMYCIN 1,000 MG INTRAVENOUS INJECTION
15.0000 mg/kg | Freq: Once | INTRAVENOUS | Status: AC
Start: 2021-09-05 — End: 2021-09-05
  Administered 2021-09-05: 0 mg via INTRAVENOUS
  Administered 2021-09-05: 1750 mg via INTRAVENOUS
  Filled 2021-09-05: qty 20

## 2021-09-05 MED ORDER — DEXTROSE 50 % IN WATER (D50W) INTRAVENOUS SYRINGE
12.5000 g | INJECTION | INTRAVENOUS | Status: DC | PRN
Start: 2021-09-05 — End: 2021-09-10

## 2021-09-05 MED ORDER — LIDOCAINE 2 % MUCOSAL JELLY IN APPLICATOR
Freq: Once | Status: AC
Start: 2021-09-05 — End: 2021-09-05
  Administered 2021-09-05: 10 mL via TOPICAL

## 2021-09-05 MED ORDER — NYSTATIN 100,000 UNIT/GRAM TOPICAL POWDER
Freq: Two times a day (BID) | CUTANEOUS | Status: AC
Start: 2021-09-05 — End: 2021-09-10
  Administered 2021-09-07 – 2021-09-08 (×2): 0 g via TOPICAL
  Filled 2021-09-05 (×5): qty 15

## 2021-09-05 MED ORDER — METHYLPREDNISOLONE SOD SUCCINATE 40 MG/ML SOLUTION FOR INJ. WRAPPER
40.0000 mg | Freq: Three times a day (TID) | INTRAMUSCULAR | Status: DC
Start: 2021-09-05 — End: 2021-09-08
  Administered 2021-09-05 – 2021-09-07 (×6): 40 mg via INTRAVENOUS
  Administered 2021-09-07: 0 mg via INTRAVENOUS
  Administered 2021-09-08: 40 mg via INTRAVENOUS
  Filled 2021-09-05 (×8): qty 1

## 2021-09-05 MED ORDER — CARVEDILOL 25 MG TABLET
25.0000 mg | ORAL_TABLET | Freq: Two times a day (BID) | ORAL | Status: AC
Start: 2021-09-05 — End: 2021-09-07
  Administered 2021-09-05 (×2): 25 mg via ORAL
  Administered 2021-09-07: 0 mg via ORAL
  Filled 2021-09-05: qty 1

## 2021-09-05 MED ORDER — ATENOLOL 50 MG TABLET
50.0000 mg | ORAL_TABLET | Freq: Every day | ORAL | Status: AC
Start: 2021-09-05 — End: 2021-09-09
  Administered 2021-09-05 – 2021-09-09 (×5): 50 mg via ORAL
  Filled 2021-09-05 (×4): qty 1

## 2021-09-05 MED ORDER — CARVEDILOL 25 MG TABLET
ORAL_TABLET | ORAL | Status: AC
Start: 2021-09-05 — End: 2021-09-05
  Filled 2021-09-05: qty 1

## 2021-09-05 MED ORDER — ACETAMINOPHEN 325 MG TABLET
650.0000 mg | ORAL_TABLET | ORAL | Status: DC | PRN
Start: 2021-09-05 — End: 2021-09-10

## 2021-09-05 MED ORDER — ONDANSETRON HCL (PF) 4 MG/2 ML INJECTION SOLUTION
4.0000 mg | Freq: Four times a day (QID) | INTRAMUSCULAR | Status: DC | PRN
Start: 2021-09-05 — End: 2021-09-10

## 2021-09-05 MED ORDER — LACTATED RINGERS INTRAVENOUS SOLUTION
INTRAVENOUS | Status: DC
Start: 2021-09-05 — End: 2021-09-08
  Administered 2021-09-05 – 2021-09-08 (×2): 0 mL via INTRAVENOUS

## 2021-09-05 MED ORDER — PANTOPRAZOLE 40 MG TABLET,DELAYED RELEASE
40.0000 mg | DELAYED_RELEASE_TABLET | Freq: Every day | ORAL | Status: DC
Start: 2021-09-05 — End: 2021-09-05
  Administered 2021-09-05: 40 mg via ORAL

## 2021-09-05 MED ORDER — LIDOCAINE 2 % MUCOSAL JELLY IN APPLICATOR
Status: AC
Start: 2021-09-05 — End: 2021-09-05
  Filled 2021-09-05: qty 10

## 2021-09-05 MED ORDER — GABAPENTIN 100 MG CAPSULE
ORAL_CAPSULE | ORAL | Status: AC
Start: 2021-09-05 — End: 2021-09-05
  Filled 2021-09-05: qty 1

## 2021-09-05 MED ORDER — FAMOTIDINE 20 MG TABLET
40.0000 mg | ORAL_TABLET | Freq: Every day | ORAL | Status: AC
Start: 2021-09-05 — End: 2021-09-10
  Administered 2021-09-05 – 2021-09-06 (×2): 40 mg via ORAL
  Administered 2021-09-07: 0 mg via ORAL
  Administered 2021-09-08 – 2021-09-09 (×2): 40 mg via ORAL
  Filled 2021-09-05 (×3): qty 2

## 2021-09-05 MED ORDER — HYDROCODONE 5 MG-ACETAMINOPHEN 325 MG TABLET
ORAL_TABLET | ORAL | Status: AC
Start: 2021-09-05 — End: 2021-09-05
  Filled 2021-09-05: qty 1

## 2021-09-05 MED ORDER — VANCOMYCIN 10 GRAM INTRAVENOUS SOLUTION
18.0000 mg/kg | Freq: Once | INTRAVENOUS | Status: AC
Start: 2021-09-05 — End: 2021-09-05
  Administered 2021-09-05: 0 mg via INTRAVENOUS
  Administered 2021-09-05: 2000 mg via INTRAVENOUS
  Filled 2021-09-05 (×2): qty 20

## 2021-09-05 MED ORDER — AMLODIPINE 5 MG TABLET
ORAL_TABLET | ORAL | Status: AC
Start: 2021-09-05 — End: 2021-09-05
  Filled 2021-09-05: qty 1

## 2021-09-05 MED ORDER — DEXTROSE 50 % IN WATER (D50W) INTRAVENOUS SYRINGE
25.0000 g | INJECTION | INTRAVENOUS | Status: DC | PRN
Start: 2021-09-05 — End: 2021-09-10

## 2021-09-05 MED ORDER — ATENOLOL 50 MG TABLET
50.0000 mg | ORAL_TABLET | Freq: Two times a day (BID) | ORAL | Status: DC
Start: 2021-09-05 — End: 2021-09-08
  Administered 2021-09-05 – 2021-09-08 (×5): 0 mg via ORAL

## 2021-09-05 MED ORDER — SODIUM CHLORIDE 0.9 % INTRAVENOUS PIGGYBACK
INJECTION | INTRAVENOUS | Status: AC
Start: 2021-09-05 — End: 2021-09-05
  Filled 2021-09-05: qty 50

## 2021-09-05 MED ORDER — SODIUM CHLORIDE 0.9 % INTRAVENOUS PIGGYBACK
1.0000 g | Freq: Two times a day (BID) | INTRAVENOUS | Status: DC
Start: 2021-09-05 — End: 2021-09-05

## 2021-09-05 MED ORDER — TACROLIMUS 1 MG CAPSULE, IMMEDIATE-RELEASE
3.0000 mg | ORAL_CAPSULE | Freq: Every morning | ORAL | Status: DC
Start: 2021-09-06 — End: 2021-09-06
  Administered 2021-09-06: 3 mg via ORAL
  Filled 2021-09-05 (×3): qty 3

## 2021-09-05 MED ORDER — GABAPENTIN 300 MG CAPSULE
ORAL_CAPSULE | ORAL | Status: AC
Start: 2021-09-05 — End: 2021-09-05
  Filled 2021-09-05: qty 1

## 2021-09-05 MED ORDER — LACTATED RINGERS IV BOLUS
1000.0000 mL | INJECTION | Freq: Once | Status: AC
Start: 2021-09-05 — End: 2021-09-05
  Administered 2021-09-05: 1000 mL via INTRAVENOUS
  Administered 2021-09-05: 0 mL via INTRAVENOUS

## 2021-09-05 MED ORDER — HYDROCHLOROTHIAZIDE 25 MG TABLET
25.0000 mg | ORAL_TABLET | Freq: Every day | ORAL | Status: DC
Start: 2021-09-05 — End: 2021-09-06
  Administered 2021-09-05: 25 mg via ORAL

## 2021-09-05 MED ORDER — PANTOPRAZOLE 40 MG TABLET,DELAYED RELEASE
DELAYED_RELEASE_TABLET | ORAL | Status: AC
Start: 2021-09-05 — End: 2021-09-05
  Filled 2021-09-05: qty 1

## 2021-09-05 MED ORDER — ATENOLOL 50 MG TABLET
ORAL_TABLET | ORAL | Status: AC
Start: 2021-09-05 — End: 2021-09-05
  Filled 2021-09-05: qty 1

## 2021-09-05 MED ORDER — GABAPENTIN 400 MG CAPSULE
800.0000 mg | ORAL_CAPSULE | Freq: Three times a day (TID) | ORAL | Status: DC
Start: 2021-09-05 — End: 2021-09-06
  Administered 2021-09-05: 0 mg via ORAL
  Administered 2021-09-06: 800 mg via ORAL
  Filled 2021-09-05: qty 2

## 2021-09-05 MED ORDER — ATROPINE 1 % EYE DROPS
1.0000 [drp] | Freq: Four times a day (QID) | OPHTHALMIC | Status: DC
Start: 2021-09-06 — End: 2021-09-10
  Administered 2021-09-05 – 2021-09-06 (×2): 0 [drp] via OPHTHALMIC
  Administered 2021-09-06: 1 [drp] via OPHTHALMIC
  Administered 2021-09-07 (×2): 0 [drp] via OPHTHALMIC
  Administered 2021-09-07: 1 [drp] via OPHTHALMIC
  Administered 2021-09-07 – 2021-09-08 (×4): 0 [drp] via OPHTHALMIC
  Administered 2021-09-08 – 2021-09-10 (×6): 1 [drp] via OPHTHALMIC
  Administered 2021-09-10: 0 [drp] via OPHTHALMIC
  Filled 2021-09-05: qty 5

## 2021-09-05 MED ORDER — SODIUM CHLORIDE 0.9 % INTRAVENOUS PIGGYBACK
1.0000 g | Freq: Two times a day (BID) | INTRAVENOUS | Status: DC
Start: 2021-09-05 — End: 2021-09-08
  Administered 2021-09-05: 1 g via INTRAVENOUS
  Administered 2021-09-05: 0 g via INTRAVENOUS
  Administered 2021-09-06: 1 g via INTRAVENOUS
  Administered 2021-09-06: 0 g via INTRAVENOUS
  Administered 2021-09-06: 1 g via INTRAVENOUS
  Administered 2021-09-06 – 2021-09-07 (×2): 0 g via INTRAVENOUS
  Administered 2021-09-07 (×2): 1 g via INTRAVENOUS
  Administered 2021-09-07 – 2021-09-08 (×2): 0 g via INTRAVENOUS
  Administered 2021-09-08: 1 g via INTRAVENOUS
  Filled 2021-09-05 (×6): qty 10

## 2021-09-05 MED ORDER — CARVEDILOL 25 MG TABLET
25.0000 mg | ORAL_TABLET | Freq: Two times a day (BID) | ORAL | Status: DC
Start: 2021-09-05 — End: 2021-09-08
  Administered 2021-09-05 – 2021-09-08 (×4): 0 mg via ORAL

## 2021-09-05 MED ORDER — AMLODIPINE 5 MG TABLET
5.0000 mg | ORAL_TABLET | Freq: Every day | ORAL | Status: AC
Start: 2021-09-05 — End: 2021-09-09
  Administered 2021-09-05 – 2021-09-09 (×5): 5 mg via ORAL
  Filled 2021-09-05 (×4): qty 1

## 2021-09-05 MED ORDER — LIPASE-PROTEASE-AMYLASE 12,000-38,000-60,000 UNIT CAPSULE,DELAYED REL
3.0000 | DELAYED_RELEASE_CAPSULE | Freq: Two times a day (BID) | ORAL | Status: DC
Start: 2021-09-05 — End: 2021-09-05
  Administered 2021-09-05 (×2): 3 via ORAL
  Filled 2021-09-05 (×3): qty 3

## 2021-09-05 MED ORDER — GABAPENTIN 300 MG CAPSULE
400.0000 mg | ORAL_CAPSULE | Freq: Two times a day (BID) | ORAL | Status: DC
Start: 2021-09-05 — End: 2021-09-05
  Administered 2021-09-05 (×2): 400 mg via ORAL
  Filled 2021-09-05: qty 1

## 2021-09-05 MED ORDER — MYCOPHENOLATE MOFETIL 250 MG CAPSULE
250.0000 mg | ORAL_CAPSULE | Freq: Two times a day (BID) | ORAL | Status: AC
Start: 2021-09-05 — End: 2021-09-10
  Administered 2021-09-05 (×2): 250 mg via ORAL
  Administered 2021-09-06 – 2021-09-09 (×8): 0 mg via ORAL
  Filled 2021-09-05 (×3): qty 1

## 2021-09-05 MED ORDER — MAGNESIUM OXIDE 400 MG (241.3 MG MAGNESIUM) TABLET
400.0000 mg | ORAL_TABLET | Freq: Two times a day (BID) | ORAL | Status: DC
Start: 2021-09-05 — End: 2021-09-10
  Administered 2021-09-05 – 2021-09-06 (×4): 400 mg via ORAL
  Administered 2021-09-07: 0 mg via ORAL
  Administered 2021-09-07 – 2021-09-10 (×6): 400 mg via ORAL
  Filled 2021-09-05 (×9): qty 1

## 2021-09-05 MED ORDER — INSULIN GLARGINE 100 UNITS/ML SUBQ - CHARGE BY DOSE
45.0000 [IU] | Freq: Every evening | SUBCUTANEOUS | Status: DC
Start: 2021-09-06 — End: 2021-09-05

## 2021-09-05 MED ORDER — INSULIN GLARGINE 100 UNITS/ML SUBQ - CHARGE BY DOSE
45.0000 [IU] | Freq: Every evening | SUBCUTANEOUS | Status: DC
Start: 2021-09-05 — End: 2021-09-06
  Administered 2021-09-05: 45 [IU] via SUBCUTANEOUS
  Filled 2021-09-05: qty 45

## 2021-09-05 NOTE — Ancillary Notes (Signed)
NURSING NOTIFIED RT THAT PT's  SPO2 WAS IN THE 80's WHILE SLEEPING ON HOME CPAP AND ROOM AIR. HOME CPAP HAD NO ATTACHMENT FOR O2. PT AGREED TO WEAR OUR CPAP. SPO2 IN THE MID 90's AT THIS TIME ON 35%.

## 2021-09-05 NOTE — ED Nurses Note (Signed)
Awaiting  transfer to another hospital   posterior RT heel has large blister with yellowish brown foul odor drainage

## 2021-09-05 NOTE — ED Nurses Note (Signed)
Patient awaiting bed at [facility]. Care transferred to [facility].

## 2021-09-05 NOTE — ED Attending Note (Addendum)
[facility] emergency department         HISTORY OF PRESENT ILLNESS     Date:  09/05/2021  Patient's Name:  Martin Porter  Date of Birth:  18-Jun-1965    Patient is a 56 year old presenting by EMS complaining of left lower quadrant abdominal pain and cramping for the last 2 days.  Patient has a history of kidney transplant at [facility] in 2005.  Patient is legally blind he has a history of anemia diabetes mellitus with significant diabetic neuropathy.  Patient noticed his right heel it was inflamed and thinks that he cut his right lower extremity heel area 2 days ago.  Since then he has had pain and swelling involving the right lower extremity.  He denied any other trauma or injury to the right leg.  He denies any cough congestion or chest pain          Review of Systems     Review of Systems   Constitutional: Positive for chills.   HENT: Negative.    Respiratory: Negative.    Cardiovascular: Negative.    Gastrointestinal: Positive for abdominal pain.   Genitourinary: Positive for decreased urine volume.   Musculoskeletal: Negative.    Skin: Positive for rash.   Neurological: Negative.    Psychiatric/Behavioral: Negative.    All other systems reviewed and are negative.      Previous History     Past Medical History:  Past Medical History:   Diagnosis Date   . Abnormal prostate specific antigen    . Anemia, unspecified    . Atrial fibrillation (CMS HCC)    . Candida infection    . CKD (chronic kidney disease) stage 3, GFR 30-59 ml/min (CMS HCC)    . Cyst of kidney, acquired    . Diabetic peripheral neuropathy (CMS HCC)    . GERD (gastroesophageal reflux disease)    . Herpes simplex    . Hiatal hernia    . Hypogonadism in male    . Hypomagnesemia    . Impotence    . Legal blindness    . Mixed hyperlipidemia    . Obesity, unspecified     12/18 pt would like referral for bariatric surgery, would like to be seen by [facility] because of his hx of transplant   . Obstructive sleep  apnea syndrome     4/18 compliant with CPAP, using nasal PILLOW MILD OSA. 12/17 MILD, WILL FOLLOW UP WITH SLEEP MED FOR CPAP MAY  BE BECAUSE FATIGUE   . Peptic ulcer     9/18 seen by gen surg/[facility], EGD and sono pending gastric ulcer, no neoplasm, H pylori negative 3/18 will need to decide on f/u EGD with Dr. Waynette Buttery   . Polyp of colon    . Renal transplant recipient    . Testicular hypofunction     5/18 followed by urology, they will follow PSA and testosterone levels prescribed gel 5/18 PSA 1.4 (slightly lower than last) followed by urology 5/18 PSA velocity increased form 2016-2017, testosterone held, was to have repeat PSA/VitD and T levels in our clinic and CC to Dr. Charlies Silvers Right testicular hypotrophy   . Tobacco dependence syndrome    . Type 2 diabetes mellitus without complications (CMS HCC)    . Unspecified glaucoma(365.9)    . Vitamin D deficiency        Past Surgical History:  Past Surgical History:   Procedure Laterality Date   . Colonoscopy     .  Eye surgery     . Foot surgery     . Hx hernia repair     . Hx renal transplant     . Hx upper endoscopy     . Kidney surgery     . Other surgical history     . Shoulder surgery         Social History:  Social History     Tobacco Use   . Smoking status: Never   . Smokeless tobacco: Never     Social History     Substance and Sexual Activity   Drug Use Not on file       Family History:  Family History   Problem Relation Age of Onset   . Diabetes type II Mother    . COPD Father    . Diabetes type II Father    . Other Maternal Grandfather         CHOLESCYSTECTOMY       Medication History:  Current Outpatient Medications   Medication Sig   . amLODIPine (NORVASC) 5 mg Oral Tablet Take 1 Tablet (5 mg total) by mouth Once a day   . atenoloL (TENORMIN) 50 mg Oral Tablet Take 1 Tablet (50 mg total) by mouth Once a day   . atropine (ISOPTO ATROPINE) 1 % Ophthalmic Drops 1 Drop Four times a day   . carvediloL (COREG) 25 mg Oral Tablet Take 1 Tablet (25 mg total) by mouth Twice  daily with food   . cholecalciferol, vitamin D3, 1,250 mcg (50,000 unit) Oral Capsule Take 1,250 mcg by mouth Every 7 days   . dorzolamide-timoloL (COSOPT) 22.3-6.8 mg/mL Ophthalmic Drops Administer 1 Drop into the right eye Twice daily   . famotidine (PEPCID) 40 mg Oral Tablet Take 1 Tablet (40 mg total) by mouth Twice daily   . fenofibrate (LOFIBRA) 160 mg Oral Tablet Take 1 Tablet (160 mg total) by mouth Once a day   . gabapentin (NEURONTIN) 800 mg Oral Tablet TAKE ONE TABLET BY MOUTH THREE TIMES A DAY   . hydroCHLOROthiazide (HYDRODIURIL) 25 mg Oral Tablet Take 1 Tablet (25 mg total) by mouth Once a day   . insulin glargine 100 unit/mL Subcutaneous Insulin Pen Inject 95 Units under the skin Every night for 30 days   . iron ps complex-B12-folic acid (FERREX FORTE) 150-25-1 mg-mcg-mg Oral Capsule Take 1 Capsule by mouth Once a day   . magnesium oxide (MAG-OX) 400 mg Oral Tablet Take 1 Tablet (400 mg total) by mouth Twice daily   . mycophenolate sodium (MYFORTIC) 180 mg Oral Tablet, Delayed Release (E.C.) Take 1 Tablet (180 mg total) by mouth Twice daily   . nystatin (MYCOSTATIN) 100,000 unit/gram Cream Apply topically Twice daily   . ondansetron (ZOFRAN) 4 mg Oral Tablet Take 1 Tablet (4 mg total) by mouth Every 8 hours as needed for Nausea/Vomiting   . pancreatic enzyme replacement (CREON) 12,000-38,000 -60,000 unit Oral Capsule, Delayed Release(E.C.) TAKE FOUR CAPSULES BY MOUTH TWICE A DAY   . pantoprazole (PROTONIX) 40 mg Oral Tablet, Delayed Release (E.C.) Take 1 Tablet (40 mg total) by mouth Once a day   . potassium citrate (UROCIT-K) 10 mEq (1,080 mg) Oral Tablet Sustained Release Take 1 Tablet (10 mEq total) by mouth Twice daily with food   . pravastatin (PRAVACHOL) 40 mg Oral Tablet Take 1 Tablet (40 mg total) by mouth Every evening   . Sildenafil (VIAGRA) 100 mg Oral Tablet Take 1 Tablet (100 mg total) by mouth Every  24 hours as needed for up to 30 days   . tacrolimus (PROGRAF) 1 mg Oral Capsule Take 1  Capsule (1 mg total) by mouth Twice daily   . testosterone (ANDROGEL) 20.25 mg/1.25 gram (1.62 %) Transdermal Gel in Metered-dose Pump Place 20.25 mg on the skin Once a day   . trimethoprim-sulfamethoxazole (BACTRIM) 80-400mg  per tablet Take 1 Tablet (80 mg total) by mouth Twice daily   . TRUEPLUS PEN NEEDLE 31 gauge x 3/16" Does not apply Needle USE 5 TIMES A DAY       Allergies:  No Known Allergies    Physical Exam     Vitals:    BP (!) 147/91   Pulse 85   Temp 37.7 C (99.9 F)   Resp 18   Ht 1.829 m (6')   Wt (!) 158 kg (348 lb)   SpO2 95%   BMI 47.20 kg/m           Physical Exam  Vitals and nursing note reviewed.   Constitutional:       General: He is not in acute distress.     Appearance: He is well-developed.   HENT:      Head: Normocephalic and atraumatic.      Right Ear: Tympanic membrane normal.      Mouth/Throat:      Mouth: Mucous membranes are moist.   Eyes:      Conjunctiva/sclera: Conjunctivae normal.      Pupils: Pupils are equal, round, and reactive to light.   Cardiovascular:      Rate and Rhythm: Regular rhythm. Tachycardia present.      Heart sounds: No murmur heard.  Pulmonary:      Effort: Pulmonary effort is normal. No respiratory distress.      Breath sounds: Normal breath sounds.   Abdominal:      General: Bowel sounds are normal.      Palpations: Abdomen is soft.      Tenderness: There is no abdominal tenderness.   Musculoskeletal:         General: No swelling. Normal range of motion.      Cervical back: Normal range of motion and neck supple.      Right lower leg: Edema present.        Feet:       Comments: Right lower extremity right heel open draining ulcer with blisters    3+ edema right lower extremity with warmth and erythema   Skin:     General: Skin is warm and dry.      Capillary Refill: Capillary refill takes less than 2 seconds.      Findings: Erythema present.   Neurological:      Mental Status: He is alert and oriented to person, place, and time.   Psychiatric:          Mood and Affect: Mood normal.         Diagnostic Studies/Treatment     Medications:  Medications Administered in the ED   ondansetron (ZOFRAN) 2 mg/mL injection (4 mg Intravenous Given 09/05/21 0053)   HYDROcodone-acetaminophen (NORCO) 5-325 mg per tablet (1 Tablet Oral Given 09/05/21 0229)   clindamycin (CLEOCIN) 900 mg in NS 50 mL premix IVPB (0 mg Intravenous Stopped 09/05/21 0354)   cefTRIAXone (ROCEPHIN) 2 g in NS 50 mL IVPB minibag (0 g Intravenous Stopped 09/05/21 0321)   lidocaine 2% jelly in applicator (10 mL Topical Given 09/05/21 0317)       New Prescriptions  No medications on file       Labs:    Results for orders placed or performed during the hospital encounter of 09/04/21 (from the past 12 hour(s))   C-REACTIVE PROTEIN(CRP),INFLAMMATION   Result Value Ref Range    C-REACTIVE PROTEIN (CRP) 49.9 (H) <=0.8 mg/dL   COMPREHENSIVE METABOLIC PANEL, NON-FASTING   Result Value Ref Range    SODIUM 132 (L) 136 - 145 mmol/L    POTASSIUM 4.2 3.5 - 5.1 mmol/L    CHLORIDE 95 (L) 98 - 107 mmol/L    CO2 TOTAL 24 21 - 32 mmol/L    ANION GAP 13 10 - 20 mmol/L    BUN 59 (H) 7 - 18 mg/dL    CREATININE 4.42 (H) 0.70 - 1.30 mg/dL    BUN/CREA RATIO 13     ESTIMATED GFR 15 (L) >59 mL/min/1.3m^2    ALBUMIN 2.6 (L) 3.4 - 5.0 g/dL    CALCIUM 9.2 8.5 - 10.1 mg/dL    GLUCOSE 124 (H) 74 - 106 mg/dL    ALKALINE PHOSPHATASE 73 46 - 116 U/L    ALT (SGPT) 26 <=78 U/L    AST (SGOT) 28 15 - 37 U/L    BILIRUBIN TOTAL 0.7 0.2 - 1.0 mg/dL    PROTEIN TOTAL 6.7 6.4 - 8.2 g/dL    ALBUMIN/GLOBULIN RATIO 0.6 (L) 0.8 - 1.4    OSMOLALITY, CALCULATED 283 270 - 290 mOsm/kg    CALCIUM, CORRECTED 10.6 mg/dL    GLOBULIN 4.1    LACTIC ACID LEVEL W/ REFLEX FOR LEVEL >2.0   Result Value Ref Range    LACTIC ACID 1.3 0.4 - 2.0 mmol/L   LIPASE   Result Value Ref Range    LIPASE 102 73 - 393 U/L   MAGNESIUM   Result Value Ref Range    MAGNESIUM 1.9 1.8 - 2.4 mg/dL   PT/INR   Result Value Ref Range    PROTHROMBIN TIME 14.5 (H) 9.2 - 12.1 seconds    INR 1.36 (H) 0.88  - 1.10   PTT (PARTIAL THROMBOPLASTIN TIME)   Result Value Ref Range    APTT 34.1 (H) 22.4 - 31.7 seconds   NT-PROBNP   Result Value Ref Range    NT-PROBNP 2,547 (H) <=125 pg/mL   CBC WITH DIFF   Result Value Ref Range    WBCS UNCORRECTED      WBC 12.1 (H) 4.0 - 10.5 x10^3/uL    RBC 2.73 (L) 4.20 - 6.00 x10^6/uL    HGB 8.5 (L) 13.5 - 18.0 g/dL    HCT 25.9 (L) 42.0 - 51.0 %    MCV 94.8 78.0 - 99.0 fL    MCH 31.3 27.0 - 32.0 pg    MCHC 33.0 32.0 - 36.0 g/dL    RDW 14.9 (H) 11.6 - 14.8 %    PLATELETS 232 140 - 440 x10^3/uL    MPV 10.6 (H) 7.4 - 10.4 fL   MANUAL DIFFERENTIAL   Result Value Ref Range    WBC 12.1 x10^3/uL    NEUTROPHIL % 79 (H) 40 - 76 %    LYMPHOCYTE % 10 (L) 25 - 45 %    MONOCYTE % 7 0 - 12 %    EOSINOPHIL % 2 0 - 7 %    BASOPHIL %      METAMYELOCYTE %      MYELOCYTE %      PROMYELOCYTE %      BAND % 2 (L) 5 - 11 %  BLAST %      OTHER %      NEUTROPHIL ABSOLUTE 9.80 (H) 1.80 - 8.40 x10^3/uL    LYMPHOCYTE ABSOLUTE 1.21 1.10 - 5.00 x10^3/uL    MONOCYTE ABSOLUTE 0.85 0.00 - 1.30 x10^3/uL    EOSINOPHIL ABSOLUTE 0.24 0.00 - 0.80 x10^3/uL    BASOPHIL ABSOLUTE      METAMYELOCYTE ABSOLUTE      MYELOCYTE ABSOLUTE      PROMYELOCYTE ABSOLUTE      BLAST ABSOLUTE      OTHER CELL ABSOLUTE      ANISOCYTOSIS      POLYCHROMASIA      POIKILOCYTOSIS      BASOPHILIC STIPPLING      MICROCYTOSIS      MACROCYTOSIS      ROULEAUX      SCHISTOCYTES      SPHEROCYTES      TARGET CELLS      TEARDROP CELLS      OVALOCYTE (ELLIPTOCYTE)      CRENATED RED CELLS      STOMATOCYTES      ACANTHOCYTES (SPUR CELL)      ECHINOCYTE (BURR CELL)      BLISTER CELLS      RBC AGGLUTINATES      HOWELL JOLLY BODIES      ATYPICAL LYMPHOCYTES      TOXIC GRANULATION      DOHLE BODIES      TOXIC VACUOLIZATION      AUER RODS      BASKET CELLS      HYPERSEGMENTATION      LARGE PLATELETS      PLATELET CLUMPS      WBC MORPHOLOGY COMMENT      RBC MORPHOLOGY COMMENT Normal     PLATELET MORPHOLOGY COMMENT Normal     BANDS NEUTROPHILS MANUAL 2     BAND ABSOLUTE       NEUTROPHILS MANUAL 79     LYMPHOCYTES MANUAL 10     MONOCYTES MANUAL 7     EOSINOPHILS MANUAL 2     BASOPHILS MANUAL      PROMYELOCYTES MANUAL      MYELOCYTES MANUAL      METAMYELOCYTES MANUAL      BLASTS MANUAL      TOTAL CELLS COUNTED [#] IN BLOOD 100     OTHER CELLS MANUAL      NUCLEATED RBC MANUAL      PLASMA CELL %      PLASMA CELL ABSOLUE      PLASMA CELLS MANUAL      HYPOCHROMASIA     COVID-19, FLU A/B, RSV RAPID BY PCR   Result Value Ref Range    SARS-CoV-2 Not Detected Not Detected    INFLUENZA VIRUS TYPE A Not Detected Not Detected    INFLUENZA VIRUS TYPE B Not Detected Not Detected    RESPIRATORY SYNCTIAL VIRUS (RSV) Not Detected Not Detected   URINALYSIS, MACRO/MICRO   Result Value Ref Range    COLOR Yellow Yellow, Colorless, Light Yellow, Dark Yellow    APPEARANCE Clear Clear    SPECIFIC GRAVITY 1.025 1.003 - 1.035    PH 5.5 4.6 - 8.0    LEUKOCYTES Negative Negative WBCs/uL    NITRITE Negative Negative    PROTEIN Negative Negative mg/dL    GLUCOSE Negative Negative mg/dL    KETONES Negative Negative mg/dL    BILIRUBIN Small (A) Negative mg/dL    BLOOD Negative Negative mg/dL    UROBILINOGEN 0.2 0.2 -  1.0 mg/dL   POC BLOOD GLUCOSE (RESULTS)   Result Value Ref Range    GLUCOSE, POC 122 50 - 500 mg/dl        Radiology:  XR CHEST AP  CT ABDOMEN PELVIS WO IV CONTRAST    CT ABDOMEN PELVIS WO IV CONTRAST   Final Result   1.THERE IS SOME ILIAC CHAIN LYMPHADENOPATHY   2.SPLENOMEGALY   3.NONOBSTRUCTING STONES IN THE TRANSPLANTED KIDNEY   4.FATTY LIVER   5.SMALL HYPERDENSE LESION MID POLE RIGHT NATIVE KIDNEY COULD BE EVALUATED ELECTIVELY WITH A FOLLOW-UP ULTRASOUND          One or more dose reduction techniques were used (e.g., Automated exposure control, adjustment of the mA and/or kV according to patient size, use of iterative reconstruction technique).         Radiologist location ID: [id number]         XR CHEST AP   Final Result   NO ACUTE FINDINGS.         Radiologist location ID: [id number]              ECG:   Normal sinus rhythm left bundle-branch block PR interval 148 rate 88 beats per minute            Differential diagnosis  Abdominal pain 2. Cellulitis right lower extremity, infected right foot ulcer, acute renal injury    Course/Disposition/Plan     Course:     ED Course as of 09/05/21 1148   Thu Sep 05, 2021   1054 Patient was seen and evaluated, will initiate home medications, the most important of which are his anti-rejection renal drugs.  Will call Dr. Shon Hough regarding this patient for admission.   1145 Patient was discussed with Dr. Shon Hough, hospitalist will be admitting the patient.  Will add cefepime 1 g twice daily, no more Rocephin.  Discussed this patient's vancomycin with pharmacy, we will give him 1750 load at 15 per kg of ideal body weight.  Transports been called, patient has a bed.  My exam today shows that his lungs are clear symmetrical with good aeration, heart regular rate rhythm without murmur, abdomen is soft obese nontender.  The right heel has a weeping foul-smelling decubitus.    patient started on IV antibiotics patient acute renal failure with cellulitis infected ulcer right lower extremity  Patient with history of renal transplant [facility].  Patient now with elevated BUN creatinine cellulitis and infected wound right leg will consult [facility] regarding transfer  [facility]  [facility]  Recommended to admit patient to [facility].  Consultation called to [facility] for admission    :    Transfered to Another Facility    Condition at Disposition:      Fair   Follow up:   No follow-up provider specified.    Clinical Impression:     Clinical Impression   Cellulitis and abscess of right leg (Primary)   Abdominal pain, unspecified abdominal location   Acute renal failure (ARF) (CMS HCC)   History of kidney transplant         Winfred Burn, MD     ..Bretta Bang, DO   09/05/2021, 11:48

## 2021-09-05 NOTE — ED Nurses Note (Signed)
Patient states that pain started on Sunday, and asked aid when to look at foot and they stated it was just bruised. Patient states that right leg is very sensitive and hard to raise. Patient states pain is greater than 10 on the 0 out of  10 scale.  Patient has wound on R heel.  Patient states ABD pain is at 7 out of 10.

## 2021-09-05 NOTE — ED Nurses Note (Signed)
Patient now on 3L O2 per provider order

## 2021-09-05 NOTE — ED Nurses Note (Signed)
Asleep snoring respirations  O2 sat on 3 litters 97%  no acute distress currently

## 2021-09-05 NOTE — Consults (Signed)
Martin Porter 56 y.o. male 311/A   Date of Service: 09/05/2021    Date of Admission:  09/04/2021   PCP: Delaney Meigs, PA-C Code Status:Full Code       Reason for Consultation:  Acute renal failure, kidney transplant status  HPI:   56 year old gentleman with past medical history of kidney transplant for many years presenting to the hospital with pain and swelling associated with redness of the right lower extremity that started about 2 days ago.  Patient denies nausea or vomiting today but reports decrease in his oral intake.  Patient reports to me that usually have good kidney numbers his kidney transplant since 2015 did not have any major issues.  Patient denies missing his anti-rejection medications.  Takes only Prograf and Myfortic on regular basis.  He is not on steroids.  Lab work indicated creatinine of 4.4 BUN 59 potassium 4.2 carbon dioxide 24 calcium 9.2 magnesium 1.9.  Nephrology service was consulted for acute renal failure.      ROS:   Systematic review of 12 organ systems was negative except what mentioned in in the HPI.    ED medications:   Medications Administered in the ED   ondansetron (ZOFRAN) 2 mg/mL injection (4 mg Intravenous Given 09/05/21 0053)   HYDROcodone-acetaminophen (NORCO) 5-325 mg per tablet (1 Tablet Oral Given 09/05/21 0229)   clindamycin (CLEOCIN) 900 mg in NS 50 mL premix IVPB (0 mg Intravenous Stopped 09/05/21 0354)   cefTRIAXone (ROCEPHIN) 2 g in NS 50 mL IVPB minibag (0 g Intravenous Stopped 09/05/21 0321)   lidocaine 2% jelly in applicator (10 mL Topical Given 09/05/21 0317)       PMHx:    Past Medical History:   Diagnosis Date   . Abnormal prostate specific antigen    . Anemia, unspecified    . Atrial fibrillation (CMS HCC)    . Candida infection    . CKD (chronic kidney disease) stage 3, GFR 30-59 ml/min (CMS HCC)    . Cyst of kidney, acquired    . Diabetic peripheral neuropathy (CMS HCC)    . GERD  (gastroesophageal reflux disease)    . Herpes simplex    . Hiatal hernia    . Hypogonadism in male    . Hypomagnesemia    . Impotence    . Legal blindness    . Mixed hyperlipidemia    . Obesity, unspecified     12/18 pt would like referral for bariatric surgery, would like to be seen by [facility] because of his hx of transplant   . Obstructive sleep apnea syndrome     4/18 compliant with CPAP, using nasal PILLOW MILD OSA. 12/17 MILD, WILL FOLLOW UP WITH SLEEP MED FOR CPAP MAY  BE BECAUSE FATIGUE   . Peptic ulcer     9/18 seen by gen surg/Reese, EGD and sono pending gastric ulcer, no neoplasm, H pylori negative 3/18 will need to decide on f/u EGD with Dr. Waynette Buttery   . Polyp of colon    . Renal transplant recipient    . Testicular hypofunction     5/18 followed by urology, they will follow PSA and testosterone levels prescribed gel 5/18 PSA 1.4 (slightly lower than last) followed by urology 5/18 PSA velocity increased form 2016-2017, testosterone held, was to have repeat PSA/VitD and T levels in our clinic and CC to Dr. Charlies Silvers Right testicular hypotrophy   .  Tobacco dependence syndrome    . Type 2 diabetes mellitus without complications (CMS HCC)    . Unspecified glaucoma(365.9)    . Vitamin D deficiency         PSHx:   Past Surgical History:   Procedure Laterality Date   . COLONOSCOPY     . EYE SURGERY     . FOOT SURGERY     . HX HERNIA REPAIR     . HX RENAL TRANSPLANT      1/20 pt has fu Feb 5th 12/19 Cr 1.69 GFR 45 6/19 Cr 1.41 GFR 56 3/19 Cr 1.67 GFR 46 11/18 seen by Dr. Marcy Panning, renal fx worsening, no comment on cause 11/18 Cr 1.65 GFR 478/18 Cr 1.95 GFR 38 6/18 Cr 1.5, Ca mildly high, SPE P/UPEPpending and asap f/u with nephrology 5/18 back to baseline cr 1.2 and GFR 68 CKD GFR 59 5/18 and was 59 11/17. Last Creat 1.36 increased from 1.2 11/17.   Marland Kitchen HX UPPER ENDOSCOPY     . KIDNEY SURGERY     . 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 It seems that pt underwent  pancreas tp to "cure" DM/islet cells  a few years ago pt seen by endo and given OHG had pancreatitis, thought due to the DM medicines   . SHOULDER SURGERY            Allergies:    No Known Allergies Social History  Social History     Tobacco Use   . Smoking status: Never   . Smokeless tobacco: Never       Family History  Family Medical History:     Problem Relation (Age of Onset)    COPD Father    Diabetes type II Mother, Father    Other Maternal Grandfather             Home Meds:      Prior to Admission medications    Medication Sig Start Date End Date Taking? Authorizing Provider   amLODIPine (NORVASC) 5 mg Oral Tablet Take 1 Tablet (5 mg total) by mouth Once a day   Yes Provider, Historical   atenoloL (TENORMIN) 50 mg Oral Tablet Take 1 Tablet (50 mg total) by mouth Twice daily   Yes Provider, Historical   atropine (ISOPTO ATROPINE) 1 % Ophthalmic Drops 1 Drop Four times a day   Yes Provider, Historical   carvediloL (COREG) 25 mg Oral Tablet Take 1 Tablet (25 mg total) by mouth Twice daily with food 08/19/21  Yes Matzel, Kimberly A, PA-C   cholecalciferol, vitamin D3, 1,250 mcg (50,000 unit) Oral Capsule Take 1,250 mcg by mouth Every 7 days   Yes Provider, Historical   dorzolamide-timoloL (COSOPT) 22.3-6.8 mg/mL Ophthalmic Drops Administer 1 Drop into the right eye Twice daily   Yes Provider, Historical   famotidine (PEPCID) 40 mg Oral Tablet Take 1 Tablet (40 mg total) by mouth Twice daily   Yes Provider, Historical   fenofibrate (LOFIBRA) 160 mg Oral Tablet Take 1 Tablet (160 mg total) by mouth Once a day   Yes Provider, Historical   gabapentin (NEURONTIN) 800 mg Oral Tablet TAKE ONE TABLET BY MOUTH THREE TIMES A DAY  Patient taking differently: Take 1 Tablet (800 mg total) by mouth Three times a day 08/16/21  Yes Matzel, Janalyn Harder,  PA-C   hydroCHLOROthiazide (HYDRODIURIL) 25 mg Oral Tablet Take 1 Tablet (25 mg total) by  mouth Once a day    Provider, Historical   insulin glargine 100 unit/mL Subcutaneous Insulin Pen Inject 95 Units under the skin Every night for 30 days 07/17/21 08/16/21  Rico Ala A, PA-C   iron ps complex-B12-folic acid (FERREX FORTE) 150-25-1 mg-mcg-mg Oral Capsule Take 1 Capsule by mouth Once a day   Yes Provider, Historical   magnesium oxide (MAG-OX) 400 mg Oral Tablet Take 1 Tablet (400 mg total) by mouth Once a day   Yes Provider, Historical   mycophenolate sodium (MYFORTIC) 180 mg Oral Tablet, Delayed Release (E.C.) Take 3 Tablets (540 mg total) by mouth Twice daily   Yes Provider, Historical   nystatin (MYCOSTATIN) 100,000 unit/gram Cream Apply topically Twice daily   Yes Provider, Historical   ondansetron (ZOFRAN) 4 mg Oral Tablet Take 1 Tablet (4 mg total) by mouth Every 8 hours as needed for Nausea/Vomiting    Provider, Historical   pancreatic enzyme replacement (CREON) 12,000-38,000 -60,000 unit Oral Capsule, Delayed Release(E.C.) TAKE FOUR CAPSULES BY MOUTH TWICE A DAY 08/08/21  Yes Matzel, Kimberly A, PA-C   pantoprazole (PROTONIX) 40 mg Oral Tablet, Delayed Release (E.C.) Take 1 Tablet (40 mg total) by mouth Once a day   Yes Provider, Historical   potassium citrate (UROCIT-K) 10 mEq (1,080 mg) Oral Tablet Sustained Release Take 1 Tablet (10 mEq total) by mouth Twice daily with food    Provider, Historical   pravastatin (PRAVACHOL) 40 mg Oral Tablet Take 1 Tablet (40 mg total) by mouth Every evening    Provider, Historical   Sildenafil (VIAGRA) 100 mg Oral Tablet Take 1 Tablet (100 mg total) by mouth Every 24 hours as needed for up to 30 days 08/08/21 09/07/21  Delaney Meigs, PA-C   SULFAMETHOXAZOLE 400 MG-TRIMETHOPRIM 80 MG TABLET Take 1 Tablet (80 mg total) by mouth Every Monday, Wednesday and Friday    Provider, Historical   tacrolimus (PROGRAF) 1 mg Oral Capsule Take 3 Capsules (3 mg total) by mouth Twice daily   Yes Provider, Historical   testosterone (ANDROGEL) 20.25 mg/1.25 gram (1.62 %)  Transdermal Gel in Metered-dose Pump Place 20.25 mg on the skin Once a day    Provider, Historical   TRUEPLUS PEN NEEDLE 31 gauge x 3/16" Does not apply Needle USE 5 TIMES A DAY 09/03/21   Matzel, Kimberly A, PA-C   carvediloL (COREG) 25 mg Oral Tablet Take 1 Tablet (25 mg total) by mouth Twice daily with food  08/19/21  Provider, Historical   CREON 12,000-38,000 -60,000 unit Oral Capsule, Delayed Release(E.C.) TAKE FOUR CAPSULES BY MOUTH TWICE A DAY 08/06/21 08/08/21  Rico Ala A, PA-C   gabapentin (NEURONTIN) 800 mg Oral Tablet Take 1 Tablet (800 mg total) by mouth  08/16/21  Provider, Historical   Sildenafil (VIAGRA) 100 mg Oral Tablet Take 1 Tablet (100 mg total) by mouth Every 24 hours as needed  08/08/21  Provider, Historical   TRUEPLUS PEN NEEDLE 31 gauge x 3/16" Does not apply Needle USE 5 TIMES A DAY 07/17/21 09/03/21  Rico Ala A, PA-C          Current medications   acetaminophen (TYLENOL) tablet, 650 mg, Oral, Q4H PRN  amLODIPine (NORVASC) tablet, 5 mg, Oral, Daily  atenolol (TENORMIN) tablet, 50 mg, Oral, Daily  atenolol (TENORMIN) tablet, 50 mg, Oral, 2x/day  atropine (ISOPTO ATROPINE) 1% ophthalmic solution, 1 Drop, Both Eyes, 4x/day  atropine (ISOPTO  ATROPINE) 1% ophthalmic solution, 1 Drop, Both Eyes, 4x/day  carvedilol (COREG) tablet, 25 mg, Oral, 2x/day-Food  carvedilol (COREG) tablet, 25 mg, Oral, 2x/day-Food  cefepime (MAXIPIME) 1 g in NS 50 mL IVPB minibag, 1 g, Intravenous, Q12H  cholecalciferol (VITAMIN D3) 1000 unit (25 mcg) tablet, 1,000 Units, Oral, Daily  dextrose 50% (0.5 g/mL) injection - syringe, 25 g, Intravenous, Q15 Min PRN   Or  dextrose 50% (0.5 g/mL) injection - syringe, 12.5 g, Intravenous, Q15 Min PRN   Or  glucagon (GLUCAGEN DIAGNOSTIC KIT) injection 1 mg, 1 mg, Subcutaneous, Q15 Min PRN   Or  glucagon (GLUCAGEN DIAGNOSTIC KIT) injection 1 mg, 1 mg, IntraMUSCULAR, Q15 Min PRN  famotidine (PEPCID) tablet, 40 mg, Oral, Daily  gabapentin (NEURONTIN) capsule 400 mg, 400 mg, Oral,  2x/day  heparin 5,000 unit/mL injection, 5,000 Units, Subcutaneous, Q8HRS  hydroCHLOROthiazide (HYDRODIURIL) tablet, 25 mg, Oral, Daily  insulin glargine 100 units/mL injection, 90 Units, Subcutaneous, QPM  ipratropium-albuterol 0.5 mg-3 mg(2.5 mg base)/3 mL Solution for Nebulization, 3 mL, Nebulization, Q4H PRN  LR premix infusion, , Intravenous, Continuous  magnesium oxide (MAG-OX) 451m (2466melemental magnesium) tablet, 400 mg, Oral, 2x/day  mycophenolate (CELLCEPT) 250 mg capsule, 250 mg, Oral, 2X/day  nystatin (NYSTOP) 100,000 units/g topical powder, , Apply Topically, 2x/day  ondansetron (ZOFRAN) 2 mg/mL injection, 4 mg, Intravenous, Q6H PRN  pancreatic enzyme replacement (CREON) 12,000 units lipase per cap, 3 Capsule, Oral, 2x/day  pantoprazole (PROTONIX) delayed release tablet, 40 mg, Oral, Daily  pantoprazole (PROTONIX) delayed release tablet, 40 mg, Oral, Daily  pravastatin (PRAVACHOL) tablet, 40 mg, Oral, QPM  pravastatin (PRAVACHOL) tablet, 40 mg, Oral, QPM  SSIP insulin R human (HUMULIN R) 100 units/mL injection, 0-12 Units, Subcutaneous, 4x/day PRN  tacrolimus (PROGRAF) capsule, 1 mg, Oral, QAM  trimethoprim-sulfamethoxazole (BACTRIM) 40-20062mer 5 mL oral liquid, 80 mg, Oral, Q24H  Vancomycin IV - Pharmacist to Dose per Protocol, , Does not apply, Daily PRN            Physical:  Filed Vitals:    09/05/21 1140 09/05/21 1301 09/05/21 1326 09/05/21 1625   BP:  (!) 115/54  (!) 106/54   Pulse: 85 83 79 80   Resp:  18  (!) 24   Temp:  36.6 C (97.9 F)  36.7 C (98 F)   SpO2:    97%        Intake/Output Summary (Last 24 hours) at 09/05/2021 1925  Last data filed at 09/05/2021 1811  Gross per 24 hour   Intake 2761.33 ml   Output 750 ml   Net 2011.33 ml        Patient is sleepy.  NAD    HEENT normocephalic atraumatic.  Eye exam normal inspection.    Mucous membranes moist, no jaundice.  Neck exam no JVD normal inspection.  Cardiovascular system: Regular rate and rhythm no murmurs rubs or gallops. No chest  wall tenderness  Lungs: Clear to auscultation bilaterally no wheezing no rhonchi.  Abdomen soft nontender nondistended.  Right lower quadrant allograft nontender.  Obese  Extremities :  Right lower extremity edema and tenderness to palpation.     Neuro exam: EOMI, normal speech    Labs:  CBC:     12.1*, 12.1 (05/04 0117) \   8.5* (05/04 0117) /   232 (05/04 0117)      / 25.9* (05/04 0117) \          BMP:   132* (05/04 0117) 95* (05/04 0117) 59* (05/04  4481)    /     124* (05/04 8563)   4.2 (05/04 1497) 24 (05/04 0117) 4.42* (05/04 0117) \                 Diagnostic studies:  Imaging:   ECG 12 LEAD  Normal sinus rhythm  Left bundle branch block  Abnormal ECG  No previous ECGs available  Confirmed by Anderson Malta ([id number]) on 09/05/2021 8:52:06 AM  CT ABDOMEN PELVIS WO IV CONTRAST  Narrative: Braxton L Bringhurst    RADIOLOGIST: Colletta Maryland, MD    CT ABDOMEN PELVIS WO IV CONTRAST performed on 09/05/2021 2:44 AM    CLINICAL HISTORY: Left flank pain history of kidney transplant.  left flank pain, hx of kidney transplant     TECHNIQUE:  Abdomen and pelvis CT without intravenous contrast.    COMPARISON:  None.  # of known CTs in the past 12 months: 0   # of known Cardiac Nuclear Medicine Studies in the past 12 months: 0    FINDINGS:  Noncontrast technique limits evaluation of the abdominal and pelvic viscera.    Lung bases: Clear    Liver:   Diffuse fatty infiltration.    Gallbladder:   Unremarkable.    Spleen:   Splenomegaly is present. The spleen is about 14.7 cm in length. No focal splenic lesion    Pancreas:   Unremarkable.    Adrenals:   Unremarkable.    Kidneys:   The native kidneys are markedly atrophic. In the midpole of the right kidney, there is a 1.4 cm hypodense lesion which is slightly exophytic. This is nonspecific as to the etiology and could be a proteinaceous cyst or solid mass.    There is a transplant kidney in the left iliac fossa. There is a 9.4 cm cyst in the upper pole of the transplanted kidney. There are  multiple nonobstructing stones in the left kidney the largest of which is in the midpole and measures 11 mm in diameter. No hydronephrosis in the transplanted kidney.    Bladder:  Unremarkable.    Prostate:  Unremarkable.    Bowel:   Unremarkable.    Appendix:  Normal.    Lymph nodes:  There is a 1.6 cm x 1.1 cm right common iliac chain lymph node there is a 1.2 cm x 1 cm right external iliac chain lymph node    Vasculature:   Mild diffuse atherosclerotic calcifications are noted.     Peritoneum / Retroperitoneum: No ascites.  No free air.    Bones:   Unremarkable.  Impression: 1.THERE IS SOME ILIAC CHAIN LYMPHADENOPATHY  2.SPLENOMEGALY  3.NONOBSTRUCTING STONES IN THE TRANSPLANTED KIDNEY  4.FATTY LIVER  5.SMALL HYPERDENSE LESION MID POLE RIGHT NATIVE KIDNEY COULD BE EVALUATED ELECTIVELY WITH A FOLLOW-UP ULTRASOUND     One or more dose reduction techniques were used (e.g., Automated exposure control, adjustment of the mA and/or kV according to patient size, use of iterative reconstruction technique).    Radiologist location ID: [id number]  XR CHEST AP  Narrative: Madsen L Tavares    RADIOLOGIST: Tempie Donning    XR CHEST AP performed on 09/05/2021 12:52 AM    CLINICAL HISTORY: Shortness of breath.  sob    TECHNIQUE: Frontal view of the chest.    COMPARISON:  11/12/2015    FINDINGS:    Cardiac and mediastinal contours are stable.   Chronic-appearing changes of both lungs. No definite acute airspace opacities are appreciated. No significant effusions.   Impression:  NO ACUTE FINDINGS.    Radiologist location ID: [id number]      Assessments:  Active Hospital Problems   (*Primary Problem)    Diagnosis   . *Cellulitis   . CKD (chronic kidney disease) stage 3, GFR 30-59 ml/min (CMS HCC)   . Renal transplant recipient       Plan:     Acute renal failure on chronic kidney disease stage 3  -baseline creatinine is unavailable  -agree with IV fluids  -acute renal failure is prerenal in etiology  -UA was reviewed  -CT  abdomen was reviewed we will benefit from repeat ultrasound as outpatient  -avoid all nephrotoxins    Kidney transplant status  -continue Prograf.  -hold CellCept  -patient will be on Solu-Medrol 40 t.i.d. IV    Edema and cellulitis  -rule out DVT  -IV antibiotics                  Beather Arbour, MD, FASN, 09/05/2021, 19:25

## 2021-09-05 NOTE — Care Plan (Signed)
Problem: Adult Inpatient Plan of Care  Goal: Plan of Care Review  Outcome: Ongoing (see interventions/notes)  Goal: Patient-Specific Goal (Individualized)  Outcome: Ongoing (see interventions/notes)  Flowsheets (Taken 09/05/2021 1300)  Individualized Care Needs: antibiotics and pain control  Anxieties, Fears or Concerns: worried about the infection and pain in rle  Patient-Specific Goals (Include Timeframe): Improved swelling in right heel daily  Goal: Absence of Hospital-Acquired Illness or Injury  Outcome: Ongoing (see interventions/notes)  Intervention: Identify and Manage Fall Risk  Recent Flowsheet Documentation  Taken 09/05/2021 1400 by West Carbo, RN  Safety Promotion/Fall Prevention:   activity supervised   fall prevention program maintained   muscle strengthening facilitated   safety round/check completed   nonskid shoes/slippers when out of bed  Intervention: Prevent Skin Injury  Recent Flowsheet Documentation  Taken 09/05/2021 1400 by West Carbo, RN  Body Position:   positioned/repositioned independently   lower extremity elevated, right  Intervention: Prevent Infection  Recent Flowsheet Documentation  Taken 09/05/2021 1400 by West Carbo, RN  Infection Prevention:   promote handwashing   glycemic control managed  Goal: Optimal Comfort and Wellbeing  Outcome: Ongoing (see interventions/notes)  Goal: Rounds/Family Conference  Outcome: Ongoing (see interventions/notes)     Problem: Fall Injury Risk  Goal: Absence of Fall and Fall-Related Injury  Outcome: Ongoing (see interventions/notes)  Intervention: Promote Injury-Free Environment  Recent Flowsheet Documentation  Taken 09/05/2021 1400 by West Carbo, RN  Safety Promotion/Fall Prevention:   activity supervised   fall prevention program maintained   muscle strengthening facilitated   safety round/check completed   nonskid shoes/slippers when out of bed     Problem: Pain Acute  Goal: Optimal Pain Control and Function  Outcome: Ongoing (see  interventions/notes)     Problem: Infection  Goal: Absence of Infection Signs and Symptoms  Outcome: Ongoing (see interventions/notes)     Problem: Skin or Soft Tissue Infection  Goal: Absence of Infection Signs and Symptoms  Outcome: Ongoing (see interventions/notes)  Intervention: Minimize and Manage Infection Progression  Recent Flowsheet Documentation  Taken 09/05/2021 1400 by West Carbo, RN  Infection Prevention:   promote handwashing   glycemic control managed     Problem: UTI (Urinary Tract Infection)  Goal: Improved Infection Symptoms  Outcome: Ongoing (see interventions/notes)

## 2021-09-05 NOTE — H&P (Signed)
[facility]  Hospitalist Admission H&P    Martin Porter, Martin Porter, 56 y.o. male  Date of Admission:  09/04/2021  Date of Birth:  Aug 15, 1965    Information Obtained from:  Chief Complaint:  Abdominal pain    HPI: Martin Porter is a 56 y.o., White male who presents with complaint of abdominal pain described as diffuse generalized cramping, worse left lower quadrant x2 days  He denies any fever chills  Denies any nausea vomiting or diarrhea  Denies any melena hematemesis or hematochezia  He is noted to be quite dry on physical exam but states that he has been eating and drinking okay  He was seen in emergency room at [facility] and was found to have erythema edema and wound on right heel reportedly very foul odor  Patient is legally blind and does not recall specific injury although he states he thinks he may have cut the right heel 2 days ago  He denies any chest pain or shortness of breath  Was resting comfortably during my exam, sleeping easily awakened  He was also found to be in acute renal failure, status post kidney transplant 2005.  ER attempted to have him transferred back to [facility] however no beds available      Assessment/plan:      ** ABDOMINAL PAIN    --CT abdomen/pelvis negative acute    ** CELLULITIS RIGHT LEG    --cefepime/vancomycin    --Wound consult    --CT right lower extremity    ** ACUTE KIDNEY INJURY    --s/p renal transplant(Myfortic/tacrolimus    --volume resuscitate    --discussed with Dr. 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    ** PEPTIC ULCER DISEASE/GERD    --Protonix      Continue home meds as appropriate  Aggressive volume resuscitation  Serial labs                        Past Medical History:   Diagnosis Date   .  Abnormal prostate specific antigen    . Anemia, unspecified    . Atrial fibrillation (CMS HCC)    . Candida infection    . CKD (chronic kidney disease) stage 3, GFR 30-59 ml/min (CMS HCC)    . Cyst of kidney, acquired    . Diabetic peripheral neuropathy (CMS HCC)    . GERD (gastroesophageal reflux disease)    . Herpes simplex    . Hiatal hernia    . Hypogonadism in male    . Hypomagnesemia    . Impotence    . Legal blindness    . Mixed hyperlipidemia    . Obesity, unspecified     12/18 pt would like referral for bariatric surgery, would like to be seen by [facility] because of his hx of transplant   . Obstructive sleep apnea syndrome     4/18 compliant with CPAP, using nasal PILLOW MILD OSA. 12/17 MILD, WILL FOLLOW UP WITH SLEEP MED FOR CPAP MAY  BE BECAUSE FATIGUE   . Peptic ulcer     9/18 seen by gen surg/Reese, EGD and sono pending gastric ulcer, no neoplasm, H pylori negative 3/18  will need to decide on f/u EGD with Dr. Waynette Buttery   . Polyp of colon    . Renal transplant recipient    . Testicular hypofunction     5/18 followed by urology, they will follow PSA and testosterone levels prescribed gel 5/18 PSA 1.4 (slightly lower than last) followed by urology 5/18 PSA velocity increased form 2016-2017, testosterone held, was to have repeat PSA/VitD and T levels in our clinic and CC to Dr. Charlies Silvers Right testicular hypotrophy   . Tobacco dependence syndrome    . Type 2 diabetes mellitus without complications (CMS HCC)    . Unspecified glaucoma(365.9)    . Vitamin D deficiency          Past Surgical History:   Procedure Laterality Date   . COLONOSCOPY     . EYE SURGERY     . FOOT SURGERY     . HX HERNIA REPAIR     . HX RENAL TRANSPLANT      1/20 pt has fu Feb 5th 12/19 Cr 1.69 GFR 45 6/19 Cr 1.41 GFR 56 3/19 Cr 1.67 GFR 46 11/18 seen by Dr. Marcy Panning, renal fx worsening, no comment on cause 11/18 Cr 1.65 GFR 478/18 Cr 1.95 GFR 38 6/18 Cr 1.5, Ca mildly high, SPE P/UPEPpending and asap f/u with nephrology 5/18 back to baseline cr  1.2 and GFR 68 CKD GFR 59 5/18 and was 59 11/17. Last Creat 1.36 increased from 1.2 11/17.   Marland Kitchen HX UPPER ENDOSCOPY     . KIDNEY SURGERY     . 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 It seems that pt underwent  pancreas tp to "cure" DM/islet cells  a few years ago pt seen by endo and given OHG had pancreatitis, thought due to the DM medicines   . SHOULDER SURGERY           Medications Prior to Admission     Prescriptions    amLODIPine (NORVASC) 5 mg Oral Tablet    Take 1 Tablet (5 mg total) by mouth Once a day    atenoloL (TENORMIN) 50 mg Oral Tablet    Take 1 Tablet (50 mg total) by mouth Twice daily    atropine (ISOPTO ATROPINE) 1 % Ophthalmic Drops    1 Drop Four times a day    carvediloL (COREG) 25 mg Oral Tablet    Take 1 Tablet (25 mg total) by mouth Twice daily with food    cholecalciferol, vitamin D3, 1,250 mcg (50,000 unit) Oral Capsule    Take 1,250 mcg by mouth Every 7 days    dorzolamide-timoloL (COSOPT) 22.3-6.8 mg/mL Ophthalmic Drops    Administer 1 Drop into the right eye Twice daily    famotidine (PEPCID) 40 mg Oral Tablet    Take 1 Tablet (40 mg total) by mouth Twice daily    fenofibrate (LOFIBRA) 160 mg Oral Tablet    Take 1 Tablet (160 mg total) by mouth Once a day    gabapentin (NEURONTIN) 800 mg Oral Tablet    TAKE ONE TABLET BY MOUTH THREE TIMES A DAY    Patient taking differently:  Take 1 Tablet (800 mg total) by mouth Three times a day    hydroCHLOROthiazide (HYDRODIURIL) 25 mg Oral Tablet    Take 1 Tablet (25 mg total) by mouth Once a  day    insulin glargine 100 unit/mL Subcutaneous Insulin Pen    Inject 95 Units under the skin Every night for 30 days    iron ps complex-B12-folic acid (FERREX FORTE) 150-25-1 mg-mcg-mg Oral Capsule    Take 1 Capsule by mouth Once a day    magnesium oxide (MAG-OX) 400 mg Oral Tablet    Take 1 Tablet (400 mg  total) by mouth Once a day    mycophenolate sodium (MYFORTIC) 180 mg Oral Tablet, Delayed Release (E.C.)    Take 3 Tablets (540 mg total) by mouth Twice daily    nystatin (MYCOSTATIN) 100,000 unit/gram Cream    Apply topically Twice daily    ondansetron (ZOFRAN) 4 mg Oral Tablet    Take 1 Tablet (4 mg total) by mouth Every 8 hours as needed for Nausea/Vomiting    pancreatic enzyme replacement (CREON) 12,000-38,000 -60,000 unit Oral Capsule, Delayed Release(E.C.)    TAKE FOUR CAPSULES BY MOUTH TWICE A DAY    pantoprazole (PROTONIX) 40 mg Oral Tablet, Delayed Release (E.C.)    Take 1 Tablet (40 mg total) by mouth Once a day    potassium citrate (UROCIT-K) 10 mEq (1,080 mg) Oral Tablet Sustained Release    Take 1 Tablet (10 mEq total) by mouth Twice daily with food    pravastatin (PRAVACHOL) 40 mg Oral Tablet    Take 1 Tablet (40 mg total) by mouth Every evening    Sildenafil (VIAGRA) 100 mg Oral Tablet    Take 1 Tablet (100 mg total) by mouth Every 24 hours as needed for up to 30 days    SULFAMETHOXAZOLE 400 MG-TRIMETHOPRIM 80 MG TABLET    Take 1 Tablet (80 mg total) by mouth Every Monday, Wednesday and Friday    tacrolimus (PROGRAF) 1 mg Oral Capsule    Take 3 Capsules (3 mg total) by mouth Twice daily    testosterone (ANDROGEL) 20.25 mg/1.25 gram (1.62 %) Transdermal Gel in Metered-dose Pump    Place 20.25 mg on the skin Once a day    TRUEPLUS PEN NEEDLE 31 gauge x 3/16" Does not apply Needle    USE 5 TIMES A DAY        No Known Allergies  Social History     Socioeconomic History   . Marital status: Single   Tobacco Use   . Smoking status: Never   . Smokeless tobacco: Never     Past Family History:     Family Medical History:     Problem Relation (Age of Onset)    COPD Father    Diabetes type II Mother, Father    Other Maternal Grandfather                EXAM:  Temperature: (P) 36.6 C (97.9 F)  Heart Rate: 79  BP (Non-Invasive): (!) (P) 115/54  Respiratory Rate: (P) 18  SpO2: 95 %    General obese white male no  acute distress  HEENT normocephalic atraumatic pupils equal membranes very dry  Neck supple  Chest equal expansion  Lungs clear  Heart regular rate and rhythm  Abdomen soft obese some mild generalized tenderness no guarding or rebound  Extremities negative for clubbing cyanosis or edema with the exception of the right leg has 3+ edema to mid thigh right heel purple in color with a blister lesion that is draining serosanguineous fluid  Distal pulses intact  Neuro alert oriented x3 no gross deficits  Psychiatric normal affect  Immunization History   Administered Date(s) Administered   . Covid-19 Vaccine,Pfizer Bivalent,68mcg/0.3ml,12 yrs+ 07/10/2021   . Covid-19 Vaccine,Pfizer-BioNTech,Purple Top,64yrs+ 05/16/2019, 06/06/2019, 01/20/2020   . HEPATITIS A VACCINE -ADULT 07/21/2017, 11/04/2017   . Hep B, Unspecified Formulation 11/04/2017, 12/08/2017   . Influenza Vaccine, 6 month-adult 05/30/2021   . Pneumococcal, Unspecified Formulation 05/05/2012   . Tetanus Toxoid/Diphtheria Toxoid/Acellular Pertussis Vaccine, Adsorbed (Tdap) 06/04/2016       DNR Status:  No Order      ASSESSMENT:    Active Hospital Problems    Diagnosis   . Primary Problem: Cellulitis                 Felecia Jan, MD

## 2021-09-05 NOTE — Ancillary Notes (Signed)
Pt's nurse notified RT that pt wanted off CPAP. Pt was taken off by rn and placed onto nc. Rt checked pt, O2 sat 96%. Awake/alert. No cc.

## 2021-09-05 NOTE — Care Plan (Signed)
Problem: Adult Inpatient Plan of Care  Goal: Absence of Hospital-Acquired Illness or Injury  09/05/2021 1537 by Enid Baas, RN  Outcome: Ongoing (see interventions/notes)     Problem: Adult Inpatient Plan of Care  Goal: Patient-Specific Goal (Individualized)  09/05/2021 1537 by Enid Baas, RN  Outcome: Ongoing (see interventions/notes)  Flowsheets (Taken 09/05/2021 1300)  Individualized Care Needs: antibiotics and pain control  Anxieties, Fears or Concerns: worried about the infection and pain in rle  Patient-Specific Goals (Include Timeframe): Improved swelling in right heel daily

## 2021-09-06 ENCOUNTER — Inpatient Hospital Stay (HOSPITAL_COMMUNITY): Payer: Medicare Other

## 2021-09-06 DIAGNOSIS — Z940 Kidney transplant status: Secondary | ICD-10-CM

## 2021-09-06 DIAGNOSIS — Z794 Long term (current) use of insulin: Secondary | ICD-10-CM

## 2021-09-06 DIAGNOSIS — A419 Sepsis, unspecified organism: Secondary | ICD-10-CM

## 2021-09-06 DIAGNOSIS — D649 Anemia, unspecified: Secondary | ICD-10-CM

## 2021-09-06 DIAGNOSIS — E1140 Type 2 diabetes mellitus with diabetic neuropathy, unspecified: Secondary | ICD-10-CM

## 2021-09-06 DIAGNOSIS — S91301A Unspecified open wound, right foot, initial encounter: Secondary | ICD-10-CM

## 2021-09-06 DIAGNOSIS — E1122 Type 2 diabetes mellitus with diabetic chronic kidney disease: Secondary | ICD-10-CM

## 2021-09-06 LAB — CBC WITH DIFF
BASOPHIL #: 0 10*3/uL (ref 0.00–0.30)
BASOPHIL %: 0 % (ref 0–3)
EOSINOPHIL #: 0 10*3/uL (ref 0.00–0.80)
EOSINOPHIL %: 0 % (ref 0–7)
HCT: 26.1 % — ABNORMAL LOW (ref 42.0–51.0)
HGB: 8.7 g/dL — ABNORMAL LOW (ref 13.5–18.0)
LYMPHOCYTE #: 0.5 10*3/uL — ABNORMAL LOW (ref 1.10–5.00)
LYMPHOCYTE %: 5 % — ABNORMAL LOW (ref 25–45)
MCH: 31.2 pg (ref 27.0–32.0)
MCHC: 33.4 g/dL (ref 32.0–36.0)
MCV: 93.2 fL (ref 78.0–99.0)
MONOCYTE #: 0.3 10*3/uL (ref 0.00–1.30)
MONOCYTE %: 3 % (ref 0–12)
MPV: 10.6 fL — ABNORMAL HIGH (ref 7.4–10.4)
NEUTROPHIL #: 9 10*3/uL — ABNORMAL HIGH (ref 1.80–8.40)
NEUTROPHIL %: 92 % — ABNORMAL HIGH (ref 40–76)
PLATELET COMMENT: NORMAL
PLATELETS: 219 10*3/uL (ref 140–440)
RBC COMMENT: NORMAL
RBC: 2.81 10*6/uL — ABNORMAL LOW (ref 4.20–6.00)
RDW: 13.4 % (ref 11.6–14.8)
WBC: 9.9 10*3/uL (ref 4.0–10.5)
WBCS UNCORRECTED: 9.9 10*3/uL

## 2021-09-06 LAB — BASIC METABOLIC PANEL
ANION GAP: 12 mmol/L (ref 10–20)
BUN/CREA RATIO: 19 (ref 6–22)
BUN: 88 mg/dL — ABNORMAL HIGH (ref 7–25)
CALCIUM: 8.7 mg/dL (ref 8.6–10.3)
CHLORIDE: 95 mmol/L — ABNORMAL LOW (ref 98–107)
CO2 TOTAL: 23 mmol/L (ref 21–31)
CREATININE: 4.69 mg/dL — ABNORMAL HIGH (ref 0.60–1.30)
ESTIMATED GFR: 14 mL/min/{1.73_m2} — ABNORMAL LOW (ref >59)
GLUCOSE: 233 mg/dL — ABNORMAL HIGH (ref 74–109)
OSMOLALITY, CALCULATED: 296 mOsm/kg — ABNORMAL HIGH (ref 270–290)
POTASSIUM: 4.8 mmol/L (ref 3.5–5.1)
SODIUM: 130 mmol/L — ABNORMAL LOW (ref 136–145)

## 2021-09-06 LAB — MAGNESIUM: MAGNESIUM: 2.7 mg/dL (ref 1.9–2.7)

## 2021-09-06 LAB — VANCOMYCIN, TROUGH: VANCOMYCIN TROUGH: 20.9 ug/mL (ref 5.0–10.0)

## 2021-09-06 LAB — POC BLOOD GLUCOSE (RESULTS)
GLUCOSE, POC: 247 mg/dl (ref 50–500)
GLUCOSE, POC: 268 mg/dl (ref 50–500)

## 2021-09-06 MED ORDER — INSULIN GLARGINE (U-100) 100 UNIT/ML (3 ML) SUBCUTANEOUS PEN
45.0000 [IU] | PEN_INJECTOR | Freq: Every morning | SUBCUTANEOUS | Status: DC
Start: 2021-09-07 — End: 2021-09-06

## 2021-09-06 MED ORDER — HEPARIN (PORCINE) 5,000 UNIT/ML INJECTION SOLUTION
5000.0000 [IU] | Freq: Two times a day (BID) | INTRAMUSCULAR | Status: DC
Start: 2021-09-06 — End: 2021-09-10
  Administered 2021-09-06: 5000 [IU] via SUBCUTANEOUS
  Administered 2021-09-07 – 2021-09-10 (×7): 0 [IU] via SUBCUTANEOUS
  Filled 2021-09-06: qty 1

## 2021-09-06 MED ORDER — DORZOLAMIDE 2 % EYE DROPS
1.0000 [drp] | Freq: Two times a day (BID) | OPHTHALMIC | Status: DC
Start: 2021-09-06 — End: 2021-09-10
  Administered 2021-09-06 – 2021-09-07 (×2): 1 [drp] via OPHTHALMIC
  Administered 2021-09-07 – 2021-09-08 (×2): 0 [drp] via OPHTHALMIC
  Administered 2021-09-08 – 2021-09-10 (×4): 1 [drp] via OPHTHALMIC
  Filled 2021-09-06: qty 10

## 2021-09-06 MED ORDER — TACROLIMUS 1 MG CAPSULE, IMMEDIATE-RELEASE
2.0000 mg | ORAL_CAPSULE | Freq: Two times a day (BID) | ORAL | Status: DC
Start: 2021-09-06 — End: 2021-09-08
  Administered 2021-09-06: 2 mg via ORAL
  Administered 2021-09-07: 0 mg via ORAL
  Administered 2021-09-07 – 2021-09-08 (×2): 2 mg via ORAL
  Filled 2021-09-06 (×6): qty 2

## 2021-09-06 MED ORDER — INSULIN LISPRO 100 UNIT/ML SUB-Q - CHARGE BY DOSE
25.0000 [IU] | Freq: Two times a day (BID) | SUBCUTANEOUS | Status: DC
Start: 2021-09-07 — End: 2021-09-10
  Administered 2021-09-07: 0 [IU] via SUBCUTANEOUS
  Administered 2021-09-07 – 2021-09-09 (×5): 25 [IU] via SUBCUTANEOUS
  Administered 2021-09-10 (×2): 0 [IU] via SUBCUTANEOUS
  Administered 2021-09-10: 25 [IU] via SUBCUTANEOUS
  Filled 2021-09-06: qty 12
  Filled 2021-09-06 (×5): qty 25

## 2021-09-06 MED ORDER — TACROLIMUS 1 MG CAPSULE, IMMEDIATE-RELEASE
2.0000 mg | ORAL_CAPSULE | Freq: Two times a day (BID) | ORAL | Status: DC
Start: 2021-09-07 — End: 2021-09-06
  Filled 2021-09-06: qty 2

## 2021-09-06 MED ORDER — MYCOPHENOLATE SODIUM 180 MG TABLET,DELAYED RELEASE
540.0000 mg | DELAYED_RELEASE_TABLET | Freq: Two times a day (BID) | ORAL | Status: DC
Start: 2021-09-06 — End: 2021-09-10
  Administered 2021-09-06: 540 mg via ORAL
  Administered 2021-09-07 – 2021-09-10 (×7): 0 mg via ORAL

## 2021-09-06 MED ORDER — HYDROCODONE 5 MG-ACETAMINOPHEN 325 MG TABLET
1.0000 | ORAL_TABLET | ORAL | Status: DC | PRN
Start: 2021-09-06 — End: 2021-09-10
  Administered 2021-09-07 – 2021-09-09 (×3): 1 via ORAL
  Filled 2021-09-06 (×3): qty 1

## 2021-09-06 MED ORDER — DORZOLAMIDE 22.3 MG-TIMOLOL 6.8 MG/ML EYE DROPS
1.0000 [drp] | Freq: Two times a day (BID) | OPHTHALMIC | Status: DC
Start: 2021-09-06 — End: 2021-09-06

## 2021-09-06 MED ORDER — PREDNISOLONE ACETATE 1 % EYE DROPS,SUSPENSION
1.0000 [drp] | Freq: Two times a day (BID) | OPHTHALMIC | Status: DC
Start: 2021-09-06 — End: 2021-09-10
  Administered 2021-09-06 – 2021-09-07 (×2): 1 [drp] via OPHTHALMIC
  Administered 2021-09-07 – 2021-09-08 (×2): 0 [drp] via OPHTHALMIC
  Administered 2021-09-08 – 2021-09-09 (×2): 1 [drp] via OPHTHALMIC
  Administered 2021-09-09: 0 [drp] via OPHTHALMIC
  Administered 2021-09-10: 1 [drp] via OPHTHALMIC
  Filled 2021-09-06: qty 1

## 2021-09-06 MED ORDER — TIMOLOL MALEATE 0.5 % EYE DROPS
1.0000 [drp] | Freq: Two times a day (BID) | OPHTHALMIC | Status: DC
Start: 2021-09-06 — End: 2021-09-10
  Administered 2021-09-06 – 2021-09-07 (×2): 1 [drp] via OPHTHALMIC
  Administered 2021-09-07: 0 [drp] via OPHTHALMIC
  Administered 2021-09-08: 1 [drp] via OPHTHALMIC
  Administered 2021-09-08: 0 [drp] via OPHTHALMIC
  Administered 2021-09-09 – 2021-09-10 (×3): 1 [drp] via OPHTHALMIC
  Filled 2021-09-06: qty 5

## 2021-09-06 MED ORDER — INSULIN GLARGINE (U-100) 100 UNIT/ML SUBCUTANEOUS SOLUTION
35.0000 [IU] | Freq: Every evening | SUBCUTANEOUS | Status: DC
Start: 2021-09-06 — End: 2021-09-10
  Administered 2021-09-06 – 2021-09-09 (×4): 35 [IU] via SUBCUTANEOUS
  Filled 2021-09-06 (×5): qty 35

## 2021-09-06 MED ORDER — GABAPENTIN 100 MG CAPSULE
200.0000 mg | ORAL_CAPSULE | Freq: Three times a day (TID) | ORAL | Status: DC
Start: 2021-09-06 — End: 2021-09-10
  Administered 2021-09-06 – 2021-09-07 (×4): 200 mg via ORAL
  Administered 2021-09-07: 0 mg via ORAL
  Administered 2021-09-08 – 2021-09-10 (×8): 200 mg via ORAL
  Filled 2021-09-06 (×12): qty 2

## 2021-09-06 MED ORDER — INSULIN ASPART (U-100) 100 UNIT/ML (3 ML) SUBCUTANEOUS PEN
25.0000 [IU] | PEN_INJECTOR | Freq: Two times a day (BID) | SUBCUTANEOUS | Status: DC
Start: 2021-09-07 — End: 2021-09-06

## 2021-09-06 MED ORDER — INSULIN GLARGINE 100 UNITS/ML SUBQ - CHARGE BY DOSE
45.0000 [IU] | Freq: Every morning | SUBCUTANEOUS | Status: DC
Start: 2021-09-07 — End: 2021-09-10
  Administered 2021-09-07 – 2021-09-10 (×4): 45 [IU] via SUBCUTANEOUS
  Filled 2021-09-06: qty 45
  Filled 2021-09-06: qty 35
  Filled 2021-09-06 (×3): qty 45
  Filled 2021-09-06 (×2): qty 35

## 2021-09-06 NOTE — Respiratory Therapy (Signed)
WAS CALLED TO FLOOR TO PLACE CPAP ON PATIENT.  NO ORDER FOR HOME CPAP NOR CPAP FROM [place].  STATED He WOULDN'T WEAR IF I BROUGHT. PT VERY RUDE ABOUT THE MATTER. CHARGE NURSE LISA NOTIFIED

## 2021-09-06 NOTE — Consults (Signed)
[facility]  General Surgery  Consultation    Date of Service:  09/06/2021  Martin Porter, Martin Porter, 56 y.o. male  Date of Admission:  09/04/2021  Date of Birth:  [birth year]  PCP: Delaney Meigs, PA-C    Reason for Consultation: evaluate foot infection, sloughing    HPI:  Martin Porter is a 56 y.o. White male with history of T2DM, kidney transplant, fatty liver disease, and  suspected cirrhosis. Admitted with AKI and right heel wound infection.  General surgery consulted for right heel evaluation.  Patient has neuropathy and is unsure, but thinks he scraped it on a soda can last week and it has progressively worsened.  He has pain, swelling, erythema, drainage, and foul odor.  WBC's have trended down.  BUN/Cr 88/4.69.  Lactic acid 1.3. CRP 49.9. CT scan of lower extremity reveals soft tissue gas present at the level of the distal lower leg and ankle.  Although this could be due to the history of a penetrating wound, and infection with a gas-forming organism can not be excluded.  No deep drainable abscesses identified.  No osteomyelitis is identified.    Past Medical History:   Diagnosis Date   . Abnormal prostate specific antigen    . Anemia, unspecified    . Atrial fibrillation (CMS HCC)    . Candida infection    . CKD (chronic kidney disease) stage 3, GFR 30-59 ml/min (CMS HCC)    . Cyst of kidney, acquired    . Diabetic peripheral neuropathy (CMS HCC)    . GERD (gastroesophageal reflux disease)    . Herpes simplex    . Hiatal hernia    . Hypogonadism in male    . Hypomagnesemia    . Impotence    . Legal blindness    . Mixed hyperlipidemia    . Obesity, unspecified     12/18 pt would like referral for bariatric surgery, would like to be seen by [facility] because of his hx of transplant   . Obstructive sleep apnea syndrome     4/18 compliant with CPAP, using nasal PILLOW MILD OSA. 12/17 MILD, WILL FOLLOW UP WITH SLEEP MED FOR CPAP MAY  BE BECAUSE FATIGUE   . Peptic ulcer     9/18 seen by gen  surg/Reese, EGD and sono pending gastric ulcer, no neoplasm, H pylori negative 3/18 will need to decide on f/u EGD with Dr. Waynette Buttery   . Polyp of colon    . Renal transplant recipient    . Testicular hypofunction     5/18 followed by urology, they will follow PSA and testosterone levels prescribed gel 5/18 PSA 1.4 (slightly lower than last) followed by urology 5/18 PSA velocity increased form 2016-2017, testosterone held, was to have repeat PSA/VitD and T levels in our clinic and CC to Dr. Charlies Silvers Right testicular hypotrophy   . Tobacco dependence syndrome    . Type 2 diabetes mellitus without complications (CMS HCC)    . Unspecified glaucoma(365.9)    . Vitamin D deficiency       Past Surgical History:   Procedure Laterality Date   . COLONOSCOPY     . EYE SURGERY     . FOOT SURGERY     . HX HERNIA REPAIR     . HX RENAL TRANSPLANT      1/20 pt has fu Feb 5th 12/19 Cr 1.69 GFR 45 6/19 Cr 1.41 GFR 56 3/19 Cr 1.67 GFR 46 11/18 seen by Dr. Marcy Panning, renal  fx worsening, no comment on cause 11/18 Cr 1.65 GFR 478/18 Cr 1.95 GFR 38 6/18 Cr 1.5, Ca mildly high, SPE P/UPEPpending and asap f/u with nephrology 5/18 back to baseline cr 1.2 and GFR 68 CKD GFR 59 5/18 and was 59 11/17. Last Creat 1.36 increased from 1.2 11/17.   Marland Kitchen HX UPPER ENDOSCOPY     . KIDNEY SURGERY     . 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 It seems that pt underwent  pancreas tp to "cure" DM/islet cells  a few years ago pt seen by endo and given OHG had pancreatitis, thought due to the DM medicines   . SHOULDER SURGERY        Social History     Tobacco Use   . Smoking status: Never   . Smokeless tobacco: Never       Family Medical History:     Problem Relation (Age of Onset)    COPD Father    Diabetes type II Mother, Father    Other Maternal Grandfather         Medications Prior to Admission     Prescriptions     amLODIPine (NORVASC) 5 mg Oral Tablet    Take 1 Tablet (5 mg total) by mouth Once a day    atenoloL (TENORMIN) 50 mg Oral Tablet    Take 1 Tablet (50 mg total) by mouth Twice daily    atropine (ISOPTO ATROPINE) 1 % Ophthalmic Drops    1 Drop Four times a day    carvediloL (COREG) 25 mg Oral Tablet    Take 1 Tablet (25 mg total) by mouth Twice daily with food    cholecalciferol, vitamin D3, 1,250 mcg (50,000 unit) Oral Capsule    Take 1,250 mcg by mouth Every 7 days    dorzolamide-timoloL (COSOPT) 22.3-6.8 mg/mL Ophthalmic Drops    Administer 1 Drop into the right eye Twice daily    famotidine (PEPCID) 40 mg Oral Tablet    Take 1 Tablet (40 mg total) by mouth Twice daily    fenofibrate (LOFIBRA) 160 mg Oral Tablet    Take 1 Tablet (160 mg total) by mouth Once a day    gabapentin (NEURONTIN) 800 mg Oral Tablet    TAKE ONE TABLET BY MOUTH THREE TIMES A DAY    Patient taking differently:  Take 1 Tablet (800 mg total) by mouth Three times a day    hydroCHLOROthiazide (HYDRODIURIL) 25 mg Oral Tablet    Take 1 Tablet (25 mg total) by mouth Once a day    insulin aspart U-100 (NOVOLOG FLEXPEN U-100 INSULIN) 100 unit/mL (3 mL) Subcutaneous Insulin Pen    Inject 25 Units under the skin Twice a day before meals 25 units every morning  15 units every evening    insulin glargine 100 unit/mL Subcutaneous injection (vial)    Inject 35 Units under the skin Every night    insulin glargine 100 unit/mL Subcutaneous Insulin Pen    Inject 95 Units under the skin Every night for 30 days    Patient taking differently:  Inject 45 Units under the skin Every morning with breakfast    iron ps complex-B12-folic acid (FERREX FORTE) 150-25-1 mg-mcg-mg Oral Capsule    Take 1 Capsule by mouth Once a day    magnesium oxide (MAG-OX) 400 mg  Oral Tablet    Take 1 Tablet (400 mg total) by mouth Once a day    mycophenolate sodium (MYFORTIC) 180 mg Oral Tablet, Delayed Release (E.C.)    Take 3 Tablets (540 mg total) by mouth Twice daily    nystatin  (MYCOSTATIN) 100,000 unit/gram Cream    Apply topically Twice daily    ondansetron (ZOFRAN) 4 mg Oral Tablet    Take 1 Tablet (4 mg total) by mouth Every 8 hours as needed for Nausea/Vomiting    pancreatic enzyme replacement (CREON) 12,000-38,000 -60,000 unit Oral Capsule, Delayed Release(E.C.)    TAKE FOUR CAPSULES BY MOUTH TWICE A DAY    pantoprazole (PROTONIX) 40 mg Oral Tablet, Delayed Release (E.C.)    Take 1 Tablet (40 mg total) by mouth Once a day    potassium citrate (UROCIT-K) 10 mEq (1,080 mg) Oral Tablet Sustained Release    Take 1 Tablet (10 mEq total) by mouth Twice daily with food    pravastatin (PRAVACHOL) 40 mg Oral Tablet    Take 1 Tablet (40 mg total) by mouth Every evening    Sildenafil (VIAGRA) 100 mg Oral Tablet    Take 1 Tablet (100 mg total) by mouth Every 24 hours as needed for up to 30 days    SULFAMETHOXAZOLE 400 MG-TRIMETHOPRIM 80 MG TABLET    Take 1 Tablet (80 mg total) by mouth Every Monday, Wednesday and Friday    tacrolimus (PROGRAF) 1 mg Oral Capsule    Take 3 Capsules (3 mg total) by mouth Twice daily    testosterone (ANDROGEL) 20.25 mg/1.25 gram (1.62 %) Transdermal Gel in Metered-dose Pump    Place 20.25 mg on the skin Once a day    TRUEPLUS PEN NEEDLE 31 gauge x 3/16" Does not apply Needle    USE 5 TIMES A DAY         No Known Allergies       Patient Vitals for the past 24 hrs:   BP Temp Pulse Resp SpO2 Weight   09/06/21 0841 (!) 142/63 -- 70 -- 98 % --   09/06/21 0800 -- 36.9 C (98.4 F) -- (!) 22 -- --   09/05/21 2341 (!) 125/52 36.7 C (98.1 F) 86 20 98 % --   09/05/21 2304 -- -- 81 -- -- --   09/05/21 2046 135/64 36.6 C (97.9 F) 82 20 95 % (!) 154 kg (340 lb 5 oz)   09/05/21 1625 (!) 106/54 36.7 C (98 F) 80 (!) 24 97 % --          Physical Exam  Constitutional:       Appearance: Normal appearance. He is well-developed. He is morbidly obese.   Cardiovascular:      Rate and Rhythm: Normal rate and regular rhythm.      Heart sounds: Normal heart sounds.   Abdominal:       General: Bowel sounds are normal.      Palpations: Abdomen is soft.   Feet:      Right foot:      Skin integrity: Ulcer, erythema, warmth and dry skin present.      Comments: Right heel with edema, erythema, warmth, purulent drainage.  Very tender to  Palpation.  Foul odor noted.   Neurological:      Mental Status: He is alert.   Psychiatric:         Behavior: Behavior is cooperative.  Laboratory Data:     Results for orders placed or performed during the hospital encounter of 09/04/21 (from the past 24 hour(s))   POC BLOOD GLUCOSE (RESULTS)   Result Value Ref Range    GLUCOSE, POC 150 50 - 500 mg/dl   POC BLOOD GLUCOSE (RESULTS)   Result Value Ref Range    GLUCOSE, POC 167 50 - 500 mg/dl   BASIC METABOLIC PANEL, NON-FASTING   Result Value Ref Range    SODIUM 130 (L) 136 - 145 mmol/L    POTASSIUM 4.8 3.5 - 5.1 mmol/L    CHLORIDE 95 (L) 98 - 107 mmol/L    CO2 TOTAL 23 21 - 31 mmol/L    ANION GAP 12 10 - 20 mmol/L    CALCIUM 8.7 8.6 - 10.3 mg/dL    GLUCOSE 233 (H) 74 - 109 mg/dL    BUN 88 (H) 7 - 25 mg/dL    CREATININE 4.69 (H) 0.60 - 1.30 mg/dL    BUN/CREA RATIO 19 6 - 22    ESTIMATED GFR 14 (L) >59 mL/min/1.31m^2    OSMOLALITY, CALCULATED 296 (H) 270 - 290 mOsm/kg   MAGNESIUM   Result Value Ref Range    MAGNESIUM 2.7 1.9 - 2.7 mg/dL   CBC WITH DIFF   Result Value Ref Range    WBCS UNCORRECTED 9.9 x10^3/uL    WBC 9.9 4.0 - 10.5 x10^3/uL    RBC 2.81 (L) 4.20 - 6.00 x10^6/uL    HGB 8.7 (L) 13.5 - 18.0 g/dL    HCT 26.1 (L) 42.0 - 51.0 %    MCV 93.2 78.0 - 99.0 fL    MCH 31.2 27.0 - 32.0 pg    MCHC 33.4 32.0 - 36.0 g/dL    RDW 13.4 11.6 - 14.8 %    PLATELETS 219 140 - 440 x10^3/uL    MPV 10.6 (H) 7.4 - 10.4 fL    NEUTROPHIL % 92 (H) 40 - 76 %    LYMPHOCYTE % 5 (L) 25 - 45 %    MONOCYTE % 3 0 - 12 %    EOSINOPHIL % 0 0 - 7 %    BASOPHIL % 0 0 - 3 %    NEUTROPHIL # 9.00 (H) 1.80 - 8.40 x10^3/uL    LYMPHOCYTE # 0.50 (L) 1.10 - 5.00 x10^3/uL    MONOCYTE # 0.30 0.00 - 1.30 x10^3/uL    EOSINOPHIL # 0.00 0.00 -  0.80 x10^3/uL    BASOPHIL # 0.00 0.00 - 0.30 x10^3/uL    RBC COMMENT NORMAL     PLATELET COMMENT NORMAL    POC BLOOD GLUCOSE (RESULTS)   Result Value Ref Range    GLUCOSE, POC 247 50 - 500 mg/dl   POC BLOOD GLUCOSE (RESULTS)   Result Value Ref Range    GLUCOSE, POC 268 50 - 500 mg/dl       Imaging Studies:    CT EXTREMITY LOWER RIGHT WO IV CONTRAST   Final Result by Edi, Radresults In (05/05 1311)   1.THERE IS SOFT TISSUE GAS PRESENT AT THE LEVEL OF THE DISTAL LOWER LEG AND ANKLE. ALTHOUGH THIS COULD BE DUE TO THE HISTORY OF A PENETRATING WOUND, AND INFECTION WITH A GAS-FORMING ORGANISM CANNOT BE EXCLUDED. NO DEEP DRAINABLE ABSCESS IS IDENTIFIED. NO OSTEOMYELITIS IS IDENTIFIED..         One or more dose reduction techniques were used (e.g., Automated exposure control, adjustment of the mA and/or kV according to patient size, use of iterative reconstruction  technique).   STEPPED ON POP CAN, LARGE OPEN SORE TO RIGHT ANKLE, AOI WRAPPED D/T WEEPING      TECHNIQUE: CT without intravenous contrast.        COMPARISON: None.   # of known CTs in the past 12 months: 0    # of known Cardiac Nuclear Medicine Studies in the past 12 months: 0      FINDINGS:   Bones: No evidence of fracture.      Joints: Joint space(s) preserved.  No subluxation or dislocation.      Soft Tissues: Unremarkable.         IMPRESSION:            One or more dose reduction techniques were used (e.g., Automated exposure control, adjustment of the mA and/or kV according to patient size, use of iterative reconstruction technique).         Radiologist location ID: [id number]         CT ABDOMEN PELVIS WO IV CONTRAST   Final Result by Edi, Radresults In (05/04 0704)   1.THERE IS SOME ILIAC CHAIN LYMPHADENOPATHY   2.SPLENOMEGALY   3.NONOBSTRUCTING STONES IN THE TRANSPLANTED KIDNEY   4.FATTY LIVER   5.SMALL HYPERDENSE LESION MID POLE RIGHT NATIVE KIDNEY COULD BE EVALUATED ELECTIVELY WITH A FOLLOW-UP ULTRASOUND          One or more dose reduction techniques  were used (e.g., Automated exposure control, adjustment of the mA and/or kV according to patient size, use of iterative reconstruction technique).         Radiologist location ID: [id number]         XR CHEST AP   Final Result by Edi, Radresults In (05/04 0057)   NO ACUTE FINDINGS.         Radiologist location ID: [id number]              Assessment/Plan:  Active Hospital Problems    Diagnosis   . Primary Problem: Cellulitis   . CKD (chronic kidney disease) stage 3, GFR 30-59 ml/min (CMS HCC)   . Renal transplant recipient       Plan for debridement of his heel tomorrow.  Risks and benefits of the procedure discussed with him at length.  All questions answered to his satisfaction.  Postoperative wound requirements discussed as well.  Informed consent clearly obtained.    The advanced practice clinician's documentation was reviewed/amended in its entirety with the assessment and plan portion completely performed independently by me during this separate encounter.  Bridgett Larsson MD MBA CPE FACS     This note was partially created using voice recognition software and is inherently subject to errors including those of syntax and "sound alike " substitutions which may escape proof reading. In such instances, original meaning may be extrapolated by contextual derivation.    Sandi Raveling, FNP

## 2021-09-06 NOTE — Progress Notes (Signed)
09/06/2021  12:33  Martin Porter  [id number]    History:  56 year old male with past medical history of morbid obesity, status post renal transplant 2005, poorly-controlled type 2 diabetes, fatty liver disease with suspected cirrhosis, hypertension, hyperlipidemia, presented to hospital as a transfer from [facility] emergency department for right heel wound infection as well as acute kidney injury.  The case was discussed with Nephrology who felt the patient could be admitted here to our hospital.    Today:  Patient has been pretty confrontational overall with nursing staff.  Refused CPAP and was very aggressive with them.  Told me that he was demanding to go home today.  When I informed him that his renal function had worsened and that his foot still looked very infected, his reply was that he demands transfer to [facility] where his transplant team is at.  I did discuss his case further with Nephrology team who felt that given the fact he has been here less than 24 hours, most likely he will still improve with hydration and would like to monitor and if not improved by the next day or so we will consider transfer.  Patient was informed of this by the nephrology team as well and seems to be agreeable.    Review of systems:  10 point review of systems was reviewed and negative except as pertinent negatives and positives mentioned in the interval history above.    Past Medical History  Pen Needle (Disposable), Sildenafil, amLODIPine, atenoloL, atropine, carvediloL, cholecalciferol (vitamin D3), dorzolamide-timoloL, famotidine, fenofibrate, gabapentin, hydroCHLOROthiazide, insulin aspart U-100, insulin glargine, iron ps complex-B12-folic acid, magnesium oxide, mycophenolate sodium, nystatin, ondansetron, pancreatic enzyme replacement, pantoprazole, potassium citrate, pravastatin, sulfamethoxazole-trimethoprim, tacrolimus, and testosterone   No Known Allergies  Past Medical History:   Diagnosis Date   . Abnormal  prostate specific antigen    . Anemia, unspecified    . Atrial fibrillation (CMS HCC)    . Candida infection    . CKD (chronic kidney disease) stage 3, GFR 30-59 ml/min (CMS HCC)    . Cyst of kidney, acquired    . Diabetic peripheral neuropathy (CMS HCC)    . GERD (gastroesophageal reflux disease)    . Herpes simplex    . Hiatal hernia    . Hypogonadism in male    . Hypomagnesemia    . Impotence    . Legal blindness    . Mixed hyperlipidemia    . Obesity, unspecified     12/18 pt would like referral for bariatric surgery, would like to be seen by [facility] because of his hx of transplant   . Obstructive sleep apnea syndrome     4/18 compliant with CPAP, using nasal PILLOW MILD OSA. 12/17 MILD, WILL FOLLOW UP WITH SLEEP MED FOR CPAP MAY  BE BECAUSE FATIGUE   . Peptic ulcer     9/18 seen by gen surg/Reese, EGD and sono pending gastric ulcer, no neoplasm, H pylori negative 3/18 will need to decide on f/u EGD with Dr. Waynette Buttery   . Polyp of colon    . Renal transplant recipient    . Testicular hypofunction     5/18 followed by urology, they will follow PSA and testosterone levels prescribed gel 5/18 PSA 1.4 (slightly lower than last) followed by urology 5/18 PSA velocity increased form 2016-2017, testosterone held, was to have repeat PSA/VitD and T levels in our clinic and CC to Dr. Charlies Silvers Right testicular hypotrophy   . Tobacco dependence syndrome    .  Type 2 diabetes mellitus without complications (CMS HCC)    . Unspecified glaucoma(365.9)    . Vitamin D deficiency          Past Surgical History:   Procedure Laterality Date   . COLONOSCOPY     . EYE SURGERY     . FOOT SURGERY     . HX HERNIA REPAIR     . HX RENAL TRANSPLANT      1/20 pt has fu Feb 5th 12/19 Cr 1.69 GFR 45 6/19 Cr 1.41 GFR 56 3/19 Cr 1.67 GFR 46 11/18 seen by Dr. Marcy Panning, renal fx worsening, no comment on cause 11/18 Cr 1.65 GFR 478/18 Cr 1.95 GFR 38 6/18 Cr 1.5, Ca mildly high, SPE P/UPEPpending and asap f/u with nephrology 5/18 back to baseline cr 1.2 and GFR  68 CKD GFR 59 5/18 and was 59 11/17. Last Creat 1.36 increased from 1.2 11/17.   Marland Kitchen HX UPPER ENDOSCOPY     . KIDNEY SURGERY     . 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 It seems that pt underwent  pancreas tp to "cure" DM/islet cells  a few years ago pt seen by endo and given OHG had pancreatitis, thought due to the DM medicines   . SHOULDER SURGERY           Family Medical History:     Problem Relation (Age of Onset)    COPD Father    Diabetes type II Mother, Father    Other Maternal Grandfather          Social History     Socioeconomic History   . Marital status: Single   Tobacco Use   . Smoking status: Never   . Smokeless tobacco: Never       Physical exam:  BP (!) 142/63   Pulse 70   Temp 36.9 C (98.4 F)   Resp (!) 22   Ht 1.854 m (6' 1")   Wt (!) 154 kg (340 lb 5 oz)   SpO2 98%   BMI 44.90 kg/m       Gen: This is a 56 y.o.  Year-old male  who is awake alert and oriented x3 in no acute distress  CV:  Regular rate and rhythm without murmurs rubs or gallops.  2+ pedal and radial pulses. No clubbing, cyanosis, or edema.  RESP:  Clear to auscultation bilaterally without wheezes rales or rhonchi.  Respirations are nonlabored.  GI:  Abdomen is soft, nontender, nondistended.  Bowel sounds normoactive.  GU:  No Foley is present  MSK:  Full range of motion of upper and lower extremities  NEURO:  Cranial nerves 2-12 grossly intact.  No focal deficits as tested.  SKIN:  Right lower extremity significantly swollen, discolored with erythema, sloughing skin    Diagnostic Labs:  Results for orders placed or performed during the hospital encounter of 09/04/21 (from the past 24 hour(s))   CBC/DIFF    Narrative    The following orders were created for panel order CBC/DIFF.  Procedure                               Abnormality         Status                     ---------                                -----------         ------  CBC WITH MWNU[[id number]]                Abnormal            Final result                 Please view results for these tests on the individual orders.   BASIC METABOLIC PANEL, NON-FASTING   Result Value Ref Range    SODIUM 130 (L) 136 - 145 mmol/L    POTASSIUM 4.8 3.5 - 5.1 mmol/L    CHLORIDE 95 (L) 98 - 107 mmol/L    CO2 TOTAL 23 21 - 31 mmol/L    ANION GAP 12 10 - 20 mmol/L    CALCIUM 8.7 8.6 - 10.3 mg/dL    GLUCOSE 233 (H) 74 - 109 mg/dL    BUN 88 (H) 7 - 25 mg/dL    CREATININE 4.69 (H) 0.60 - 1.30 mg/dL    BUN/CREA RATIO 19 6 - 22    ESTIMATED GFR 14 (L) >59 mL/min/1.65m2    OSMOLALITY, CALCULATED 296 (H) 270 - 290 mOsm/kg    Narrative    Estimated Glomerular Filtration Rate (eGFR) is calculated using the CKD-EPI (2021) equation, intended for patients 188years of age and older. If gender is not documented or "unknown", there will be no eGFR calculation.   MAGNESIUM   Result Value Ref Range    MAGNESIUM 2.7 1.9 - 2.7 mg/dL   CBC WITH DIFF   Result Value Ref Range    WBCS UNCORRECTED 9.9 x10^3/uL    WBC 9.9 4.0 - 10.5 x10^3/uL    RBC 2.81 (L) 4.20 - 6.00 x10^6/uL    HGB 8.7 (L) 13.5 - 18.0 g/dL    HCT 26.1 (L) 42.0 - 51.0 %    MCV 93.2 78.0 - 99.0 fL    MCH 31.2 27.0 - 32.0 pg    MCHC 33.4 32.0 - 36.0 g/dL    RDW 13.4 11.6 - 14.8 %    PLATELETS 219 140 - 440 x10^3/uL    MPV 10.6 (H) 7.4 - 10.4 fL    NEUTROPHIL % 92 (H) 40 - 76 %    LYMPHOCYTE % 5 (L) 25 - 45 %    MONOCYTE % 3 0 - 12 %    EOSINOPHIL % 0 0 - 7 %    BASOPHIL % 0 0 - 3 %    NEUTROPHIL # 9.00 (H) 1.80 - 8.40 x10^3/uL    LYMPHOCYTE # 0.50 (L) 1.10 - 5.00 x10^3/uL    MONOCYTE # 0.30 0.00 - 1.30 x10^3/uL    EOSINOPHIL # 0.00 0.00 - 0.80 x10^3/uL    BASOPHIL # 0.00 0.00 - 0.30 x10^3/uL    RBC COMMENT NORMAL     PLATELET COMMENT NORMAL    POC BLOOD GLUCOSE (RESULTS)   Result Value Ref Range    GLUCOSE, POC 150 50 - 500 mg/dl   POC BLOOD GLUCOSE (RESULTS)   Result Value Ref Range     GLUCOSE, POC 167 50 - 500 mg/dl   POC BLOOD GLUCOSE (RESULTS)   Result Value Ref Range    GLUCOSE, POC 247 50 - 500 mg/dl   POC BLOOD GLUCOSE (RESULTS)   Result Value Ref Range    GLUCOSE, POC 268 50 - 500 mg/dl       Diagnostic Imaging:  CT ABDOMEN PELVIS WO IV CONTRAST   Final Result   1.THERE IS SOME ILIAC CHAIN LYMPHADENOPATHY   2.SPLENOMEGALY   3.NONOBSTRUCTING STONES IN  THE TRANSPLANTED KIDNEY   4.FATTY LIVER   5.SMALL HYPERDENSE LESION MID POLE RIGHT NATIVE KIDNEY COULD BE EVALUATED ELECTIVELY WITH A FOLLOW-UP ULTRASOUND          One or more dose reduction techniques were used (e.g., Automated exposure control, adjustment of the mA and/or kV according to patient size, use of iterative reconstruction technique).         Radiologist location ID: [id number]         XR CHEST AP   Final Result   NO ACUTE FINDINGS.         Radiologist location ID: [id number]         CT EXTREMITY LOWER RIGHT WO IV CONTRAST    (Results Pending)        Assesment/Plan:  Patient Active Problem List   Diagnosis   . Hypomagnesemia   . Testicular hypofunction   . Type 2 diabetes mellitus without complications (CMS HCC)   . CKD (chronic kidney disease) stage 3, GFR 30-59 ml/min (CMS HCC)   . Anemia, unspecified   . Mixed hyperlipidemia   . Atrial fibrillation (CMS HCC)   . Diabetic peripheral neuropathy (CMS HCC)   . GERD (gastroesophageal reflux disease)   . Renal transplant recipient   . Impotence   . Hypogonadism in male   . Obstructive sleep apnea syndrome   . Candida infection   . Cellulitis        Sepsis present on admission   Right foot cellulitis  Continue with vancomycin and cefepime  CT scan pending  Duplex negative for DVT  Blood cultures negative  Wound culture growing Gram-negative rods and Gram-positive cocci    Acute kidney injury   Suspected to be prerenal  Given fatty liver appearance on CT, consider hepatorenal syndrome if does not respond to fluid resuscitation  Urine sediment benign    Abnormal lesion on kidney seen  on CT scan  Will need follow-up ultrasound on outpatient basis    Hyponatremia   Secondary to renal failure, continue to monitor closely    Anemia   Normocytic  Continue to monitor closely, consider further workup outpatient    DVT Prophylaxis:  Subcutaneous heparin  Diet:  Diabetic  Physical Therapy:  Consulted  Disposition:  Home versus rehab    On the day of the encounter, a total of  45 minutes was spent on this patient encounter including review of historical information, examination, documentation and post-visit activities. The time documented excludes procedural time.    Celene Kras, DO

## 2021-09-06 NOTE — Nurses Notes (Signed)
Vancomycin trough high at 20.9. Pharmacy called to dose the medication. Pharmacist stated that another trough level ordered  to be drawn at 10 am 09-07-21 and redose from that level.

## 2021-09-06 NOTE — Care Management Notes (Signed)
[facility]  Care Management Initial Evaluation    Patient Name: Martin Porter  Date of Birth: 08-05-1965  Sex: male  Date/Time of Admission: 09/04/2021 11:48 PM  Room/Bed: 311/A  Payor: MEDICARE / Plan: MEDICARE PART A AND B / Product Type: Medicare /   Primary Care Providers:  Rhys Martini, PA-C (General)    Pharmacy Info:   Preferred Pharmacy       [facility] - [place], [place] - [address]    [address] [place] [address]    Phone: [phone] Fax: [phone]    Hours: Not open 24 hours          Emergency Contact Info:   Extended Emergency Contact Information  Primary Emergency Contact: Roselle Locus  Mobile Phone: [phone]  Relation: Friend  Preferred language: English  Interpreter needed? No    History:   Martin Porter is a 56 y.o., male, admitted 09/05/21    Height/Weight: 185.4 cm (6\' 1" ) / (!) 154 kg (340 lb 5 oz)     LOS: 1 day   Admitting Diagnosis: Cellulitis [L03.90]    Assessment:    09/06/21 1023   Assessment Details   Assessment Type Admission   Date of Care Management Update 09/06/21   Readmission   Is this a readmission? No   Insurance Information/Type   Insurance type 11/06/21 Concerns none   Living Environment   Lives With alone   Living Arrangements mobile home   Able to Return to Prior Arrangements yes   Home Safety   Home Assessment: No Problems Identified   Home Accessibility no concerns;stairs to enter home;ramps present at home   Custody and Legal Status   Do you have a court appointed guardian/conservator? No   Are you an emancipated minor? No   Custody Issues? No   Paternity Affidavit Requested? No   Care Management Plan   Discharge Planning Status initial meeting   Projected Discharge Date 09/09/21   Discharge plan discussed with: Patient   CM will evaluate for rehabilitation potential no   Patient choice offered to patient/family no   Form for patient choice reviewed/signed and on  chart no   Patient aware of possible cost for ambulance transport?  No   Discharge Needs Assessment   Equipment Currently Used at Home none   Equipment Needed After Discharge walker, rolling;shower chair  (Pt requesting a walker and shower chair upon discharge.)   Discharge Facility/Level of Care Needs Home with DME (code 1)   Transportation Available car;family or friend will provide   Referral Information   Admission Type inpatient   Address Verified verified-no changes   Arrived From home or self-care   ADVANCE DIRECTIVES   Does the Patient have an Advance Directive? No, Information Offered and Refused   Patient Requests Assistance in Having Advance Directive Notarized. N/A           Discharge Plan:  Home (Patient/Family Member/other) (code 1), Home with DME (code 1)    Initial CM assessment completed.  Pt denies DME or HH services prior to admission.  States he would like a walker and shower chair. His discharge goals are to return home with DME.    The patient will continue to be evaluated for developing discharge needs.     Case Manager: 11/09/21, RN  Phone: [phone]

## 2021-09-06 NOTE — Nurses Notes (Signed)
Referral for Rt heel wound.   Pt admitted with cellulitis on 09/04/21.  Has hx of kidney transplant, T2DM, tobacco use.    Braden 19.  BUN 88.  Creat 4.69.    Pt has large blister to Rt heel that extends to bilateral ankles.   Pt stated that the blister came with the swelling.   Pt had scrape his ankle with movement in the bed and it was now bleeding.      09/06/21 1457   Wound (Non-Surgical) Right;Posterior Ankle   Initial Date of Wound Assessment/Initial Time of Wound Assessment: 09/05/21 1719   Present on Hospital Admission: Yes  Primary Wound Type: Cellulitis  Orientation: Right;Posterior  Location: Ankle   Wound Thickness Partial Thickness   WOUND SITE CLOSURE Open to Air   Periwound Red;Edema   Wound Edges Thickened and rolled under   Wound Bed red   Wound Length (cm) 15 cm   Wound Width (cm) 5 cm   Wound Surface Area (cm^2) 75 cm^2   Wound Drainage Amount Small   Exudate/Drainage Sanguineous   Wound Odor Absent   DRESSING TYPE Border;Border Foam   DRESSING STATUS  --    Date Wound Dressing Changed 09/06/21     Recommendation:  Mepilex Ag with foam dsg Q5 days and prn

## 2021-09-06 NOTE — Consults (Signed)
Martin Porter 56 y.o. male 311/A   Date of Service: 09/06/2021    Date of Admission:  09/04/2021   PCP: Delaney Meigs, PA-C Code Status:Full Code       Reason for Consultation:  Acute renal failure, kidney transplant status  HPI:   56 year old gentleman with past medical history of kidney transplant for many years presenting to the hospital with pain and swelling associated with redness of the right lower extremity that started about 2 days ago.  Patient denies nausea or vomiting today but reports decrease in his oral intake.  Patient reports to me that usually have good kidney numbers his kidney transplant since 2015 did not have any major issues.  Patient denies missing his anti-rejection medications.  Takes only Prograf and Myfortic on regular basis.  He is not on steroids.  Lab work indicated creatinine of 4.4 BUN 59 potassium 4.2 carbon dioxide 24 calcium 9.2 magnesium 1.9.  Nephrology service was consulted for acute renal failure.    Today:  Patient is more awake, beside some right foot pain he denies nausea or vomiting no fevers or chills.  His oral intake improved he reports good urine output.  Discussed with him and his sister the overall medical condition in the plan of care.  Explained to him the reason of holding the CellCept and being on IV steroids.  Discussed with him the importance of IV antibiotics and monitoring his Prograf level.      ROS:   Systematic review of 12 organ systems was negative except what mentioned in in the HPI.      Current medications   acetaminophen (TYLENOL) tablet, 650 mg, Oral, Q4H PRN  amLODIPine (NORVASC) tablet, 5 mg, Oral, Daily  atenolol (TENORMIN) tablet, 50 mg, Oral, Daily  [Held by provider] atenolol (TENORMIN) tablet, 50 mg, Oral, 2x/day  atropine (ISOPTO ATROPINE) 1% ophthalmic solution, 1 Drop, Both Eyes, 4x/day  [Held by provider] carvedilol (COREG) tablet, 25 mg, Oral,  2x/day-Food  [Held by provider] carvedilol (COREG) tablet, 25 mg, Oral, 2x/day-Food  cefepime (MAXIPIME) 1 g in NS 50 mL IVPB minibag, 1 g, Intravenous, Q12H  cholecalciferol (VITAMIN D3) 1000 unit (25 mcg) tablet, 1,000 Units, Oral, Daily  dextrose 50% (0.5 g/mL) injection - syringe, 25 g, Intravenous, Q15 Min PRN   Or  dextrose 50% (0.5 g/mL) injection - syringe, 12.5 g, Intravenous, Q15 Min PRN   Or  glucagon (GLUCAGEN DIAGNOSTIC KIT) injection 1 mg, 1 mg, Subcutaneous, Q15 Min PRN   Or  glucagon (GLUCAGEN DIAGNOSTIC KIT) injection 1 mg, 1 mg, IntraMUSCULAR, Q15 Min PRN  dorzolamide (TRUSOPT) 2 % ophthalmic solution, 1 Drop, Right Eye, 2x/day  famotidine (PEPCID) tablet, 40 mg, Oral, Daily  gabapentin (NEURONTIN) capsule, 200 mg, Oral, 3x/day  heparin 5,000 unit/mL injection, 5,000 Units, Subcutaneous, Q12H  [Held by provider] hydroCHLOROthiazide (HYDRODIURIL) tablet, 25 mg, Oral, Daily  HYDROcodone-acetaminophen (NORCO) 5-325 mg per tablet, 1 Tablet, Oral, Q4H PRN  insulin glargine 100 units/mL injection, 35 Units, Subcutaneous, NIGHTLY  [START ON 09/07/2021] insulin glargine 100 units/mL injection, 45 Units, Subcutaneous, Daily with Breakfast  [START ON 09/07/2021] insulin lispro 100 units/mL injection, 25 Units, Subcutaneous, 2x/day AC  ipratropium-albuterol 0.5 mg-3 mg(2.5 mg base)/3 mL Solution for Nebulization, 3 mL, Nebulization, Q4H PRN  LR premix infusion, , Intravenous, Continuous  magnesium oxide (MAG-OX) 448m (2460melemental magnesium) tablet, 400 mg, Oral, 2x/day  methylPREDNISolone sod succ (SOLU-MEDROL) 40 mg/mL  injection, 40 mg, Intravenous, Q8H  [Held by provider] mycophenolate (CELLCEPT) 250 mg capsule, 250 mg, Oral, 2X/day  [Held by provider] mycophenolate (MYFORTIC) delayed release tablet, 540 mg, Oral, 2X/day  nystatin (NYSTOP) 100,000 units/g topical powder, , Apply Topically, 2x/day  ondansetron (ZOFRAN) 2 mg/mL injection, 4 mg, Intravenous, Q6H PRN  pancreatic enzyme replacement (CREON)  12,000 units lipase per cap, 4 Capsule, Oral, 2x/day  pantoprazole (PROTONIX) delayed release tablet, 40 mg, Oral, Daily  pravastatin (PRAVACHOL) tablet, 40 mg, Oral, QPM  prednisoLONE acetate (PRED-FORTE) 1% ophthalmic suspension, 1 Drop, Both Eyes, 2x/day  SSIP insulin R human (HUMULIN R) 100 units/mL injection, 0-12 Units, Subcutaneous, 4x/day PRN  tacrolimus (PROGRAF) capsule, 3 mg, Oral, QAM  timolol (TIMOPTIC) 0.5% ophthalmic solution, 1 Drop, Right Eye, 2x/day  [Held by provider] trimethoprim-sulfamethoxazole (BACTRIM) 40-281m per 5 mL oral liquid, 80 mg, Oral, Q24H  Vancomycin IV - Pharmacist to Dose per Protocol, , Does not apply, Daily PRN            Physical:  Filed Vitals:    09/06/21 0800 09/06/21 0841 09/06/21 1358 09/06/21 1739   BP:  (!) 142/63  (!) 149/75   Pulse:  70 78 75   Resp: (!) 22   20   Temp: 36.9 C (98.4 F)   36.3 C (97.3 F)   SpO2:  98%  96%        Intake/Output Summary (Last 24 hours) at 09/06/2021 1928  Last data filed at 09/06/2021 1904  Gross per 24 hour   Intake 930 ml   Output 800 ml   Net 130 ml        Patient is awake.  NAD  HEENT normocephalic atraumatic.  Eye exam normal inspection.    Mucous membranes moist, no jaundice.  Neck exam no JVD normal inspection.  Cardiovascular system: Regular rate and rhythm no murmurs rubs or gallops. No chest wall tenderness  Lungs: Clear to auscultation bilaterally no wheezing no rhonchi.  Abdomen soft nontender nondistended.  Right lower quadrant allograft nontender.  Obese  Extremities :  Right lower extremity edema and tenderness to palpation.     Neuro exam: EOMI, normal speech    Labs:  CBC:     9.9 (05/05 0556) \   8.7* (05/05 0556) /   219 (05/05 0556)      / 26.1* (05/05 0556) \          BMP:   130* (05/05 0556) 95* (05/05 0556) 88* (05/05 0556)    /     233* (05/05 0556)   4.8 (05/05 0556) 23 (05/05 0556) 4.69* (05/05 0556) \                 Diagnostic studies:  Imaging:   CT EXTREMITY LOWER RIGHT WO IV CONTRAST  Narrative: Martin Porter    RADIOLOGIST: GBerton Bon MD    CT EXTREMITY LOWER RIGHT WO IV CONTRAST performed on 09/06/2021 1:01 PM    CLINICAL HISTORY: Cellulitis vs nec fasciitis. RADIOLOGIST: GBerton Bon MD    CT EXTREMITY LOWER RIGHT WO IV CONTRAST performed on 09/06/2021 1:01 PM    CLINICAL HISTORY: Cellulitis vs nec fasciitis.  STEPPED ON POP CAN, LARGE OPEN SORE TO RIGHT ANKLE, AOI WRAPPED D/T WEEPING    TECHNIQUE:  Right lower leg/ankle CT without intravenous contrast.      COMPARISON: None.  # of known CTs in the past 12 months: 1   # of known Cardiac Nuclear Medicine Studies in the  past 12 months: 0    FINDINGS:  Bones: No evidence of fracture.    Joints: Joint space(s) preserved.  No subluxation or dislocation.    Soft Tissues: There is soft tissue gas within the subcutaneous tissues of the distal lower leg and ankle both posteriorly and laterally there is associated soft tissue edema within the subcutaneous fat. No deep drainable fluid collections are identified  Impression: 1.THERE IS SOFT TISSUE GAS PRESENT AT THE LEVEL OF THE DISTAL LOWER LEG AND ANKLE. ALTHOUGH THIS COULD BE DUE TO THE HISTORY OF A PENETRATING WOUND, AND INFECTION WITH A GAS-FORMING ORGANISM CANNOT BE EXCLUDED. NO DEEP DRAINABLE ABSCESS IS IDENTIFIED. NO OSTEOMYELITIS IS IDENTIFIED..    One or more dose reduction techniques were used (e.g., Automated exposure control, adjustment of the mA and/or kV according to patient size, use of iterative reconstruction technique).  STEPPED ON POP CAN, LARGE OPEN SORE TO RIGHT ANKLE, AOI WRAPPED D/T WEEPING    TECHNIQUE: CT without intravenous contrast.      COMPARISON: None.  # of known CTs in the past 12 months: 0   # of known Cardiac Nuclear Medicine Studies in the past 12 months: 0    FINDINGS:  Bones: No evidence of fracture.    Joints: Joint space(s) preserved.  No subluxation or dislocation.    Soft Tissues: Unremarkable.    IMPRESSION:    One or more dose reduction techniques were used (e.g., Automated  exposure control, adjustment of the mA and/or kV according to patient size, use of iterative reconstruction technique).    Radiologist location ID: [id number]      Assessments:  Active Hospital Problems   (*Primary Problem)    Diagnosis   . *Cellulitis   . CKD (chronic kidney disease) stage 3, GFR 30-59 ml/min (CMS HCC)   . Renal transplant recipient       Plan:     Acute renal failure on chronic kidney disease stage 3  -baseline creatinine is unavailable  -creatinine on admission 4.4  -creatinine today 4.69  -continue IV fluids  -acute renal failure is prerenal in etiology  -UA was reviewed  -CT abdomen was reviewed we will benefit from repeat ultrasound as outpatient  -avoid all nephrotoxins    Kidney transplant status  -continue Prograf.  -hold CellCept  -patient will be on Solu-Medrol 40 t.i.d. IV  -watch blood sugars  -check Prograf level    Edema and cellulitis  -rule out DVT.  Duplex negative for DVT  -IV antibiotics  -surgery on board.  -wound care.  May need I and D                  Beather Arbour, MD, FASN, 09/06/2021, 19:28

## 2021-09-06 NOTE — Nurses Notes (Addendum)
Patient's sister called stating patient told her he didn't know what medications he was given. He didn't know where any of his things were. He didn't know where his remote control was. Patient had a litany of complains. Patient did not verbalize this to RN. During medication administration patient was told what each medication was, what the dosage was, how many of each pills he was receiving, as well as if the medication was from the hospital or one of his. Patient's remote/call light is attached to bed, patient has been repeatedly redirected to his call light. Phone is charging and on patient bedside. All patient belongings are at bedside.   Patient states his insulin administration is different from what he stated yesterday. Physician updated

## 2021-09-06 NOTE — Nurses Notes (Signed)
Home meds secured by RN and security. 3 envelops of medications inventoried and taken by security. Patient displeased but understands this is the hospitals policy.

## 2021-09-06 NOTE — Care Plan (Signed)
Problem: Adult Inpatient Plan of Care  Goal: Patient-Specific Goal (Individualized)  Outcome: Ongoing (see interventions/notes)  Flowsheets (Taken 09/06/2021 0841)  Anxieties, Fears or Concerns: Patient wants to make sure he gets his morning Lantus    Patient initially demanding to be discharged. Later demanding to transfer to another hospital. Spoke with physician who was able to calm him. Home medication list updated and orders modified by physician. Patient treated with sliding scale insulin. Received iv abx as scheduled. Wound care RN evaluated patient, dressed rle, and placed additional orders. Patient responding well to treatment but remains agitated and demanding.

## 2021-09-06 NOTE — PT Evaluation (Signed)
[facility]  [place], [address]  [phone]  [phone]  Rehabilitation Services  Physical Therapy Inpatient Initial Evaluation    Patient Name: Martin Porter  Date of Birth: 05-05-1966  Height: Height: 185.4 cm (6\' 1" )  Weight: Weight: (!) 154 kg (340 lb 5 oz)  Room/Bed: 311/A  Payor: MEDICARE / Plan: MEDICARE PART A AND B / Product Type: Medicare /       PMH:  Past Medical History:   Diagnosis Date    Abnormal prostate specific antigen     Anemia, unspecified     Atrial fibrillation (CMS HCC)     Candida infection     CKD (chronic kidney disease) stage 3, GFR 30-59 ml/min (CMS HCC)     Cyst of kidney, acquired     Diabetic peripheral neuropathy (CMS HCC)     GERD (gastroesophageal reflux disease)     Herpes simplex     Hiatal hernia     Hypogonadism in male     Hypomagnesemia     Impotence     Legal blindness     Mixed hyperlipidemia     Obesity, unspecified     12/18 pt would like referral for bariatric surgery, would like to be seen by [facility] because of his hx of transplant    Obstructive sleep apnea syndrome     4/18 compliant with CPAP, using nasal PILLOW MILD OSA. 12/17 MILD, WILL FOLLOW UP WITH SLEEP MED FOR CPAP MAY  BE BECAUSE FATIGUE    Peptic ulcer     9/18 seen by gen surg/Reese, EGD and sono pending gastric ulcer, no neoplasm, H pylori negative 3/18 will need to decide on f/u EGD with Dr. Waynette Buttery    Polyp of colon     Renal transplant recipient     Testicular hypofunction     5/18 followed by urology, they will follow PSA and testosterone levels prescribed gel 5/18 PSA 1.4 (slightly lower than last) followed by urology 5/18 PSA velocity increased form 2016-2017, testosterone held, was to have repeat PSA/VitD and T levels in our clinic and CC to Dr. Charlies Silvers Right testicular hypotrophy    Tobacco dependence syndrome     Type 2 diabetes mellitus without complications (CMS [place])     Unspecified glaucoma(365.9)     Vitamin D deficiency             Assessment:      (P) Admitted with cellulitis, right heel wound infection, AKI and hyponatremia. He states he is feeling mildly better. He participated in PT evaluation and made attempts to stand but was unsuccessful. He will benefit from further PT services to address deficits and aid his functional return for hopeful transfer to home at time of discharge. He may need a course of further therapy at a rehab facility or SNF prior to discharging to home, which he may no tbe agreeable. Will progress as tolerated.    Discharge Needs:    Equipment Recommendation: (P) front wheeled walker, shower chair      The patient presents with mobility limitations due to impaired strength, weight bearing restrictions, impaired functional activity tolerance, and pain  that significantly impair/prevent patient's ability to participate in mobility-related activities of daily living (MRADLs) including  ambulation and transfers in order to safely complete, toileting, bathing, food preparation, safely entering/exiting the home. This functional mobility deficit can be sufficiently resolved with the use of a (P) front wheeled walker, shower chair  in order to decrease the risk of falls, morbidity, and mortality in performance of these MRADLs.  Patient is able to safely use this assistive device.    Discharge Disposition: (P) home with home health, inpatient rehabilitation facility, skilled nursing facility    [place]   Based on current diagnosis, functional performance prior to admission, and current functional performance, this patient requires continued PT services in (P) home with home health, inpatient rehabilitation facility, skilled nursing facility in order to achieve significant functional improvements in these deficit areas: (P) aerobic capacity/endurance, gait, locomotion, and balance, muscle performance, other (see comments) (transfers and pain).        Plan:   Current Intervention: (P)  bed mobility training, gait training, home exercise program, patient/family education, strengthening, transfer training  To provide physical therapy services (P) other (see comments) (1-2x daily at least one day weekly)  for duration of (P) until discharge.    The risks/benefits of therapy have been discussed with the patient/caregiver and he/she is in agreement with the established plan of care.       Subjective & Objective     Past Medical History:   Diagnosis Date    Abnormal prostate specific antigen     Anemia, unspecified     Atrial fibrillation (CMS HCC)     Candida infection     CKD (chronic kidney disease) stage 3, GFR 30-59 ml/min (CMS HCC)     Cyst of kidney, acquired     Diabetic peripheral neuropathy (CMS HCC)     GERD (gastroesophageal reflux disease)     Herpes simplex     Hiatal hernia     Hypogonadism in male     Hypomagnesemia     Impotence     Legal blindness     Mixed hyperlipidemia     Obesity, unspecified     12/18 pt would like referral for bariatric surgery, would like to be seen by [facility] because of his hx of transplant    Obstructive sleep apnea syndrome     4/18 compliant with CPAP, using nasal PILLOW MILD OSA. 12/17 MILD, WILL FOLLOW UP WITH SLEEP MED FOR CPAP MAY  BE BECAUSE FATIGUE    Peptic ulcer     9/18 seen by gen surg/Reese, EGD and sono pending gastric ulcer, no neoplasm, H pylori negative 3/18 will need to decide on f/u EGD with Dr. Waynette Buttery    Polyp of colon     Renal transplant recipient     Testicular hypofunction     5/18 followed by urology, they will follow PSA and testosterone levels prescribed gel 5/18 PSA 1.4 (slightly lower than last) followed by urology 5/18 PSA velocity increased form 2016-2017, testosterone held, was to have repeat PSA/VitD and T levels in our clinic and CC to Dr. Charlies Silvers Right testicular hypotrophy    Tobacco dependence syndrome     Type 2 diabetes mellitus without complications (CMS [place])     Unspecified glaucoma(365.9)     Vitamin D deficiency              Past Surgical History:   Procedure Laterality Date    COLONOSCOPY      EYE SURGERY      FOOT SURGERY      HX HERNIA REPAIR      HX RENAL TRANSPLANT      1/20 pt has fu Feb 5th 12/19 Cr 1.69 GFR 45 6/19 Cr 1.41 GFR 56 3/19 Cr 1.67 GFR 46 11/18 seen  by Dr. Marcy Panning, renal fx worsening, no comment on cause 11/18 Cr 1.65 GFR 478/18 Cr 1.95 GFR 38 6/18 Cr 1.5, Ca mildly high, SPE P/UPEPpending and asap f/u with nephrology 5/18 back to baseline cr 1.2 and GFR 68 CKD GFR 59 5/18 and was 59 11/17. Last Creat 1.36 increased from 1.2 11/17.    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 It seems that pt underwent  pancreas tp to "cure" DM/islet cells  a few years ago pt seen by endo and given OHG had pancreatitis, thought due to the DM medicines    SHOULDER SURGERY          09/06/21 1420   Rehab Session   Document Type evaluation   Total PT Minutes: 25   Patient Effort good   General Information   Patient Profile Reviewed yes   Pertinent History of Current Functional Problem To ED on 09/04/21 with c/o abdominal pain and cramping in LLQ of abdomen. Found to have cellulitis and a wound heel of his RLE. He is diabetic and had a kidney transplant in 2005. He was admitted with cellulitis, AKI, right hee wound infection and hyponatremia. He is legally blind but can see shapes and objects. Orders received for PT evaluation. Patient stattes he is to have a debridement to the right heel tomorrow on 09/07/21, but therapist unsure of the accuracy of this information.   Medical Lines PIV Line;Telemetry   Respiratory Status nasal cannula   Existing Precautions/Restrictions fall precautions;low vision;other (see comments)  (wound on right heel)   Weight-bearing Status   Extremity Weight-bearing Status right lower extremity   Right Lower Extremity  weight-bearing as tolerated (WBAT)  (due to pain in foot and heel)   Mutuality/Individual Preferences   Anxieties, Fears or Concerns Concerned that he is not getting the same diabetic meds that he normally uses and that his sugars have been more out of control than when he manages it.   Individualized Care Needs Physical Therapy, Wound care to right heel/foot   Living Environment   Lives With alone   Living Arrangements mobile home   Home Accessibility ramps present at home   Functional Level Prior   Ambulation 0 - independent   Transferring 0 - independent   Toileting 0 - independent   Bathing 0 - independent   Dressing 0 - independent   Eating 0 - independent   Communication 0 - understands/communicates without difficulty   Swallowing 0-->swallows foods/liquids without difficulty   Prior Functional Level Comment States that he was able to get up and around without an AD, completed own ADL's and selfcare. He has family and friends who assist with transportation since he does not drive. Plans to return back to home at discharge.   Pre Treatment Status   Pre Treatment Patient Status Patient supine in bed   Support Present Pre Treatment  None   Communication Pre Treatment  Nurse   Communication Pre Treatment Comment nurse cleared patient to participate in PT eval   Cognitive Assessment/Interventions   Behavior/Mood Observations alert;cooperative;other (see comments)  (Patient is familiar to therapist, therefore, he was cooperative and pleasant with me. It has been documented that he has been rude and rough talking to some of the  nursing staff.)   Orientation Status oriented x 3   Attention distractible;mild impairment   Follows Commands WFL   Vital Signs   Pre-Treatment Heart Rate (beats/min) 76   Post-treatment Heart Rate (beats/min) 84   Pre SpO2 (%) 95   O2 Delivery Pre Treatment supplemental O2   Post SpO2 (%) 94   O2 Delivery Post Treatment supplemental O2   Vitals Comment O2 at 2 L/min via NC   Pain Assessment    Pre/Posttreatment Pain Comment Pain right heel and foot rated 3 at rest, 7/10 with touch or pressure/WB. Affected his ability to stand.   RUE Assessment   RUE Assessment WFL- Within Functional Limits   LUE Assessment   LUE Assessment WFL- Within Functional Limits   RLE Assessment   RLE Assessment X-Exceptions   RLE ROM WFL   RLE Strength 4-/5   LLE Assessment   LLE Assessment X-Exceptions   LLE ROM WFL   LLE Strength 4-/5   Trunk Assessment   Trunk Assessment WFL-Within Functional Limits   Bed Mobility Assessment/Treatment   Bed Mobility, Assistive Device bed rails   Supine-Sit Independence minimum assist (75% patient effort);contact guard assist   Sit to Supine, Independence minimum assist (75% patient effort)   Transfer Assessment/Treatment   Sit-Stand Independence unable to perform  (attempted to stand x3 trials with MaxA, however, he was unable to stand due to pain in right foot and weakness in BLE.)   Gait Assessment/Treatment   Independence  unable to perform   Post Treatment Status   Post Treatment Patient Status Patient supine in bed   Support Present Post Treatment  None   Communication Post Treatement Nurse   Communication Post Treatment Comment updated nurse on his status and inability to stand   Physical Therapy Clinical Impression   Assessment Admitted with cellulitis, right heel wound infection, AKI and hyponatremia. He states he is feeling mildly better. He participated in PT evaluation and made attempts to stand but was unsuccessful. He will benefit from further PT services to address deficits and aid his functional return for hopeful transfer to home at time of discharge. He may need a course of further therapy at a rehab facility or SNF prior to discharging to home, which he may no tbe agreeable. Will progress as tolerated.   Criteria for Skilled Therapeutic meets criteria   Impairments Found (describe specific impairments) aerobic capacity/endurance;gait, locomotion, and balance;muscle  performance;other (see comments)  (transfers and pain)   Rehab Potential good   Therapy Frequency other (see comments)  (1-2x daily at least one day weekly)   Predicted Duration of Therapy Intervention (days/wks) until discharge   Anticipated Equipment Needs at Discharge (PT) front wheeled walker;shower chair   Anticipated Discharge Disposition home with home health;inpatient rehabilitation facility;skilled nursing facility   Evaluation Complexity Justification   Patient History: Co-morbidity/factors that impact Plan of Care 3 or more that impact Plan of Care   Examination Components 3 or more Exam elements addressed   Presentation Evolving: Symptoms, complaints, characteristics of condition changing &/or cognitive deficits present   Clinical Decision Making Moderate complexity   Evaluation Complexity Moderate complexity   Care Plan Goals   PT Rehab Goals Bed Mobility Goal;Gait Training Goal;Strength Goal;Transfer Training Goal   Bed Mobility Goal   Bed Mobility Goal, Date Established 09/06/21   Bed Mobility Goal, Time to Achieve by discharge   Bed Mobility Goal, Activity Type all bed mobility activities   Bed Mobility Goal, Independence Level supervision required  Bed Mobility Goal, Assistive Device bed rails   Gait Training  Goal, Distance to Achieve   Gait Training  Goal, Date Established 09/06/21   Gait Training  Goal, Time to Achieve by discharge   Gait Training  Goal, Independence Level contact guard assist   Gait Training  Goal, Assist Device walker, rolling   Gait Training  Goal, Distance to Achieve 50 feet   Gait Training  Goal, Additional Goal WBAT on RLE   Strength Goal   Strength Goal, Date Established 09/06/21   Strength Goal, Time to Achieve by discharge   Strength Goal, Measure to Achieve Achieve strength BLE to 5/5 to aid transfers and gait with decreased fall risk.   Transfer Training Goal   Transfer Training Goal, Date Established 09/06/21   Transfer Training Goal, Time to Achieve by discharge    Transfer Training Goal, Activity Type sit-to-stand/stand-to-sit;bed-to-chair/chair-to-bed;toilet   Transfer Training Goal, Independence Level contact guard assist   Transfer Training Goal, Assist Device walker, rolling   Planned Therapy Interventions, PT Eval   Planned Therapy Interventions (PT) bed mobility training;gait training;home exercise program;patient/family education;strengthening;transfer training               INTERVENTION MINUTES: EVALUATION 15 minutes and THERAPEUTIC ACTIVITY 10 minutes    EVALUATION COMPLEXITY : CLINICAL DECISION MAKING OF MODERATE COMPLEXITY AS INDICATED BY PMHX, PHYSICAL THERAPY ASSESSMENT OF MUSCULOSKELETAL AND NEUROLOGICAL SYSTEMS AND ACTIVITY LIMITATIONS. CLINICAL PRESENTATION HAS CHANGING CHARACTERISTICS.    Therapist:     Geronimo Boot, PT  09/06/2021, 19:03

## 2021-09-07 ENCOUNTER — Inpatient Hospital Stay (HOSPITAL_COMMUNITY): Payer: Medicare Other | Admitting: Anesthesiology

## 2021-09-07 ENCOUNTER — Inpatient Hospital Stay (HOSPITAL_COMMUNITY): Admit: 2021-09-07 | Payer: Medicare Other | Source: Ambulatory Visit | Admitting: Surgery

## 2021-09-07 ENCOUNTER — Encounter (HOSPITAL_COMMUNITY)
Admission: EM | Disposition: A | Discharge status: Home Health | Payer: Self-pay | Source: Home / Self Care | Attending: Internal Medicine

## 2021-09-07 DIAGNOSIS — L02415 Cutaneous abscess of right lower limb: Secondary | ICD-10-CM

## 2021-09-07 LAB — MANUAL DIFFERENTIAL
BAND %: 5 % (ref 5–11)
BANDS NEUTROPHILS MANUAL: 5
LYMPHOCYTE %: 8 % — ABNORMAL LOW (ref 25–45)
LYMPHOCYTE ABSOLUTE: 0.87 10*3/uL — ABNORMAL LOW (ref 1.10–5.00)
LYMPHOCYTES MANUAL: 8
MONOCYTE %: 2 % (ref 0–12)
MONOCYTE ABSOLUTE: 0.22 10*3/uL (ref 0.00–1.30)
MONOCYTES MANUAL: 2
NEUTROPHIL %: 85 % — ABNORMAL HIGH (ref 40–76)
NEUTROPHIL ABSOLUTE: 9.81 10*3/uL — ABNORMAL HIGH (ref 1.80–8.40)
NEUTROPHILS MANUAL: 85
PLATELET MORPHOLOGY COMMENT: NORMAL
RBC MORPHOLOGY COMMENT: NORMAL
TOTAL CELLS COUNTED [#] IN BLOOD: 100
WBC: 10.9 10*3/uL

## 2021-09-07 LAB — CBC WITH DIFF
HCT: 26.6 % — ABNORMAL LOW (ref 42.0–51.0)
HGB: 9.1 g/dL — ABNORMAL LOW (ref 13.5–18.0)
MCH: 32.2 pg — ABNORMAL HIGH (ref 27.0–32.0)
MCHC: 34.4 g/dL (ref 32.0–36.0)
MCV: 93.5 fL (ref 78.0–99.0)
MPV: 11.2 fL — ABNORMAL HIGH (ref 7.4–10.4)
PLATELETS: 240 10*3/uL (ref 140–440)
RBC: 2.84 10*6/uL — ABNORMAL LOW (ref 4.20–6.00)
RDW: 13.5 % (ref 11.6–14.8)
WBC: 10.9 10*3/uL — ABNORMAL HIGH (ref 4.0–10.5)
WBCS UNCORRECTED: 10.9 10*3/uL

## 2021-09-07 LAB — COMPREHENSIVE METABOLIC PANEL, NON-FASTING
ALBUMIN/GLOBULIN RATIO: 1 (ref 0.8–1.4)
ALBUMIN: 3.1 g/dL — ABNORMAL LOW (ref 3.5–5.7)
ALKALINE PHOSPHATASE: 103 U/L (ref 34–104)
ALT (SGPT): 37 U/L (ref 7–52)
ANION GAP: 9 mmol/L — ABNORMAL LOW (ref 10–20)
AST (SGOT): 21 U/L (ref 13–39)
BILIRUBIN TOTAL: 0.5 mg/dL (ref 0.3–1.2)
BUN/CREA RATIO: 31 — ABNORMAL HIGH (ref 6–22)
BUN: 96 mg/dL — ABNORMAL HIGH (ref 7–25)
CALCIUM, CORRECTED: 9.7 mg/dL (ref 8.9–10.8)
CALCIUM: 8.8 mg/dL (ref 8.6–10.3)
CHLORIDE: 97 mmol/L — ABNORMAL LOW (ref 98–107)
CO2 TOTAL: 23 mmol/L (ref 21–31)
CREATININE: 3.1 mg/dL — ABNORMAL HIGH (ref 0.60–1.30)
ESTIMATED GFR: 23 mL/min/{1.73_m2} — ABNORMAL LOW (ref >59)
GLOBULIN: 3.2 (ref 2.9–5.4)
GLUCOSE: 368 mg/dL — ABNORMAL HIGH (ref 74–109)
OSMOLALITY, CALCULATED: 304 mOsm/kg — ABNORMAL HIGH (ref 270–290)
POTASSIUM: 5 mmol/L (ref 3.5–5.1)
PROTEIN TOTAL: 6.3 g/dL — ABNORMAL LOW (ref 6.4–8.9)
SODIUM: 129 mmol/L — ABNORMAL LOW (ref 136–145)

## 2021-09-07 LAB — POC BLOOD GLUCOSE (RESULTS)
GLUCOSE, POC: 348 mg/dl (ref 50–500)
GLUCOSE, POC: 269 mg/dl (ref 50–500)

## 2021-09-07 LAB — VANCOMYCIN, TROUGH: VANCOMYCIN TROUGH: 15.8 ug/mL — ABNORMAL HIGH (ref 5.0–10.0)

## 2021-09-07 LAB — MAGNESIUM: MAGNESIUM: 2.7 mg/dL (ref 1.9–2.7)

## 2021-09-07 SURGERY — DEBRIDEMENT LEG
Anesthesia: General | Site: Leg | Laterality: Right | Wound class: Dirty or Infected Wounds-Include old traumatic wounds

## 2021-09-07 MED ORDER — ONDANSETRON HCL (PF) 4 MG/2 ML INJECTION SOLUTION
4.0000 mg | Freq: Once | INTRAMUSCULAR | Status: DC | PRN
Start: 2021-09-07 — End: 2021-09-07

## 2021-09-07 MED ORDER — VANCOMYCIN 10 GRAM INTRAVENOUS SOLUTION
15.0000 mg/kg | INTRAVENOUS | Status: DC
Start: 2021-09-09 — End: 2021-09-08
  Filled 2021-09-07: qty 17.5

## 2021-09-07 MED ORDER — FENTANYL (PF) 50 MCG/ML INJECTION WRAPPER
25.0000 ug | INJECTION | INTRAMUSCULAR | Status: DC | PRN
Start: 2021-09-07 — End: 2021-09-07

## 2021-09-07 MED ORDER — FENTANYL (PF) 50 MCG/ML INJECTION WRAPPER
INJECTION | Freq: Once | INTRAMUSCULAR | Status: DC | PRN
Start: 2021-09-07 — End: 2021-09-07
  Administered 2021-09-07: 100 ug via INTRAVENOUS

## 2021-09-07 MED ORDER — FAMOTIDINE (PF) 20 MG/2 ML INTRAVENOUS SOLUTION
20.0000 mg | Freq: Once | INTRAVENOUS | Status: AC
Start: 2021-09-07 — End: 2021-09-07
  Administered 2021-09-07: 20 mg via INTRAVENOUS

## 2021-09-07 MED ORDER — LIDOCAINE (PF) 100 MG/5 ML (2 %) INTRAVENOUS SYRINGE
INJECTION | Freq: Once | INTRAVENOUS | Status: DC | PRN
Start: 2021-09-07 — End: 2021-09-07
  Administered 2021-09-07: 80 mg via INTRAVENOUS

## 2021-09-07 MED ORDER — IPRATROPIUM 0.5 MG-ALBUTEROL 3 MG (2.5 MG BASE)/3 ML NEBULIZATION SOLN
3.0000 mL | INHALATION_SOLUTION | Freq: Once | RESPIRATORY_TRACT | Status: DC | PRN
Start: 2021-09-07 — End: 2021-09-07

## 2021-09-07 MED ORDER — SODIUM CHLORIDE 0.9 % (FLUSH) INJECTION SYRINGE
3.0000 mL | INJECTION | Freq: Three times a day (TID) | INTRAMUSCULAR | Status: DC
Start: 2021-09-07 — End: 2021-09-07

## 2021-09-07 MED ORDER — MIDAZOLAM 5 MG/ML INJECTION WRAPPER
INTRAMUSCULAR | Status: AC
Start: 2021-09-07 — End: 2021-09-07
  Filled 2021-09-07: qty 1

## 2021-09-07 MED ORDER — ALBUTEROL SULFATE 2.5 MG/3 ML (0.083 %) SOLUTION FOR NEBULIZATION
2.5000 mg | INHALATION_SOLUTION | Freq: Once | RESPIRATORY_TRACT | Status: DC | PRN
Start: 2021-09-07 — End: 2021-09-07

## 2021-09-07 MED ORDER — PROCHLORPERAZINE EDISYLATE 10 MG/2 ML (5 MG/ML) INJECTION SOLUTION
5.0000 mg | Freq: Once | INTRAMUSCULAR | Status: DC | PRN
Start: 2021-09-07 — End: 2021-09-07

## 2021-09-07 MED ORDER — FAMOTIDINE (PF) 20 MG/2 ML INTRAVENOUS SOLUTION
INTRAVENOUS | Status: AC
Start: 2021-09-07 — End: 2021-09-07
  Filled 2021-09-07: qty 2

## 2021-09-07 MED ORDER — FENTANYL (PF) 50 MCG/ML INJECTION WRAPPER
50.0000 ug | INJECTION | INTRAMUSCULAR | Status: DC | PRN
Start: 2021-09-07 — End: 2021-09-07

## 2021-09-07 MED ORDER — PROPOFOL 10 MG/ML IV BOLUS
INJECTION | Freq: Once | INTRAVENOUS | Status: DC | PRN
Start: 2021-09-07 — End: 2021-09-07
  Administered 2021-09-07: 200 mg via INTRAVENOUS

## 2021-09-07 MED ORDER — SODIUM CHLORIDE 0.9 % (FLUSH) INJECTION SYRINGE
3.0000 mL | INJECTION | INTRAMUSCULAR | Status: DC | PRN
Start: 2021-09-07 — End: 2021-09-07

## 2021-09-07 MED ORDER — FENTANYL (PF) 50 MCG/ML INJECTION SOLUTION
INTRAMUSCULAR | Status: AC
Start: 2021-09-07 — End: 2021-09-07
  Filled 2021-09-07: qty 2

## 2021-09-07 MED ORDER — MIDAZOLAM 5 MG/ML INJECTION WRAPPER
3.0000 mg | Freq: Once | INTRAMUSCULAR | Status: DC | PRN
Start: 2021-09-07 — End: 2021-09-07
  Administered 2021-09-07: 3 mg via INTRAVENOUS

## 2021-09-07 MED ORDER — LACTATED RINGERS INTRAVENOUS SOLUTION
INTRAVENOUS | Status: DC
Start: 2021-09-07 — End: 2021-09-07
  Administered 2021-09-07: 0 mL via INTRAVENOUS

## 2021-09-07 MED ORDER — VANCOMYCIN 10 GRAM INTRAVENOUS SOLUTION
2000.0000 mg | INTRAVENOUS | Status: DC
Start: 2021-09-07 — End: 2021-09-07
  Administered 2021-09-07: 2000 mg via INTRAVENOUS
  Administered 2021-09-07: 0 mg via INTRAVENOUS
  Filled 2021-09-07: qty 20

## 2021-09-07 MED ORDER — LACTATED RINGERS INTRAVENOUS SOLUTION
INTRAVENOUS | Status: DC
Start: 2021-09-07 — End: 2021-09-07

## 2021-09-07 MED ORDER — SUCCINYLCHOLINE 20 MG/ML INTRAVENOUS WRAPPER
INJECTION | Freq: Once | INTRAVENOUS | Status: DC | PRN
Start: 2021-09-07 — End: 2021-09-07
  Administered 2021-09-07: 160 mg via INTRAVENOUS

## 2021-09-07 SURGICAL SUPPLY — 64 items
BAG BIOHAZ RD 30X24IN THK3 MIL C8-10GL LLDPE INFCT WASTE CAN (MED SURG SUPPLIES) ×2
BAG BIOHAZ RD 30X24IN THK3 MIL C8-10GL LLDPE INFCT WASTE CAN PNCT RST (MED SURG SUPPLIES) ×1 IMPLANT
BAG BIOHAZ RD 43X30IN THK3 MIL C20-30GL LLDPE INFCT WASTE (MED SURG SUPPLIES) ×1
BAG BIOHAZ RD 43X30IN THK3 MIL C20-30GL LLDPE INFCT WASTE CAN PNCT RST (MED SURG SUPPLIES) ×1 IMPLANT
BANDAGE 3.6YDX3.4IN 6 PLY HYPOALL LOFT LIGHT STRCH COTTON GAUZE STRL LF  DISP (WOUND CARE SUPPLY) ×1 IMPLANT
BANDAGE 82X6IN STRL CNFRM STRCH GAUZE COMPRESS LF DISP (WOUND CARE SUPPLY) ×1 IMPLANT
BANDAGE 82X6IN STRL CNFRM STRCH GAUZE COMPRESS LF DISP (WOUND CARE/ENTEROSTOMAL SUPPLY) ×1
BANDAGE SFFRM 75X4IN STRCH FNSH EDGE ABS PLSTR RYN GAUZE (WOUND CARE/ENTEROSTOMAL SUPPLY) ×1
BANDAGE SFFRM 75X4IN STRCH FNSH EDGE ABS PLSTR RYN GAUZE STRL LF  DISP (WOUND CARE SUPPLY) ×1 IMPLANT
BLADE 10 2 END CBNSTL SURG STRL DISP (CUTTING ELEMENTS) ×1
BLADE 10 2 END CBNSTL SURG STRL DISP (SURGICAL CUTTING SUPPLIES) ×1 IMPLANT
BLADE 15 2 END CBNSTL SURG STRL DISP (CUTTING ELEMENTS) ×1
BLADE 15 2 END CBNSTL SURG STRL DISP (SURGICAL CUTTING SUPPLIES) ×1 IMPLANT
CLEANER ESURG TIP 2X2IN TIP POLISHR CAUT STRL LF (CUTTING ELEMENTS) ×1
CLEANER ESURG TIP 2X2IN TIP POLISHR CAUT STRL LF (SURGICAL CUTTING SUPPLIES) ×1 IMPLANT
CLEANER INSTR PREPZYME MUL-TRD CONTAINR NARSL NEUT PH BDGR (MISCELLANEOUS PT CARE ITEMS) ×1
CONV USE 123874 - SYRINGE AMSURE MDCHC 60CC LF  STRL TIP PRTC SM TUBE ADPR IRRG DISC BULB POLYPROP (MED SURG SUPPLIES) ×1 IMPLANT
CONV USE ITEM 313924 - WRAP COMPRESS 5YDX6IN COBAN PO_R SLFADH ELAS LTWT HAND TEAR (WOUND CARE SUPPLY) ×1 IMPLANT
CONV USE ITEM 321837 - GLOVE SURG 7.5 LTX PF NONST CRM (GLOVES AND ACCESSORIES) ×1 IMPLANT
CONV USE ITEM 329146 - CLEANER INSTR PREPZYME MUL-TRD CONTAINR NARSL NEUT PH BDGR 22OZ (MISCELLANEOUS PT CARE ITEMS) ×1 IMPLANT
CONV USE ITEM 34153 - ELECTRODE ESURG BLADE PNCL 3/32IN STRL SS CAUT PSHBTN STD SHAFT LF  VEGA SER (SURGICAL CUTTING SUPPLIES) ×1 IMPLANT
CONV USE ITEM 343591 - SOLIDIFY FLUID 1500CC NONST LF  PREM SOLIDIFY + (MED SURG SUPPLIES) ×1 IMPLANT
CONV USE ITEM 81225 - BAG BIOHAZ RD 30X24IN THK3 MIL C8-10GL LLDPE INFCT WASTE CAN PNCT RST (MED SURG SUPPLIES) ×1 IMPLANT
COUNTER 20 CNT BLOCK ADH NEEDLE STRL LF  RD SHARP FOAM 15.75X11.5X14IN DISP (MED SURG SUPPLIES) ×1 IMPLANT
COUNTER 20 CNT BLOCK ADH NEEDLE STRL LF RD SHARP FOAM 15.75 (MED SURG SUPPLIES) ×1
COVER TBL 90X50IN STD SMS REINF FNFLD STRL LF  DISP (DRAPE/PACKS/SHEETS/OR TOWEL) ×1 IMPLANT
COVER TBL 90X50IN STD SMS REINF FNFLD STRL LF DISP (DRAPE/PACKS/SHEETS/OR TOWEL) ×1
DRAPE FNFLD ABS REINF 77X53IN 43528 PRXM LF  STRL DISP SURG SMS 44X23IN (DRAPE/PACKS/SHEETS/OR TOWEL) ×2 IMPLANT
DRAPE FNFLD ABS REINF 77X53IN_43528 PRXM LF STRL DISP SURG (DRAPE/PACKS/SHEETS/OR TOWEL) ×2
ELECTRODE ESURG BLADE PNCL 3/32IN STRL SS CAUT PSHBTN STD (CUTTING ELEMENTS) ×1
GAUZE BND ROLL 3.4IN X 3.6 YDS_MEDC (WOUND CARE/ENTEROSTOMAL SUPPLY) ×1
GLOVE SURG 6.5 LTX PF BEAD CUF MICRO ROUGHEN N-PYRG STRL STRW BGL SRG CURVE FINGER (GLOVES AND ACCESSORIES) ×1 IMPLANT
GLOVE SURG 6.5 LTX PF BEAD CUF_MICRO RGH N-PYRG STRL STRW (GLOVES AND ACCESSORIES) ×1
GLOVE SURG 7 LF  PF SMOOTH TXTR BEAD CUF STRL GRN 12IN SENSICARE PI PLISPRN SYN PLMR ALOE THK7.9 MIL (GLOVES AND ACCESSORIES) ×1 IMPLANT
GLOVE SURG 7 LF  PF STRL PLISPRN DISP (GLOVES AND ACCESSORIES) ×1 IMPLANT
GLOVE SURG 7 LF PF SMOOTH STRL WHT PLISPRN (GLOVES AND ACCESSORIES) ×1
GLOVE SURG 7 LF PF SMOOTH TXTR BEAD CUF STRL GRN 12IN (GLOVES AND ACCESSORIES) ×1
GLOVE SURG 7.5 LTX PF SMOOTH STRL CRM (GLOVES AND ACCESSORIES) ×1
GOWN SURG LRG STD LGTH REG L3 NONREINFORCE BRTHBL TWL STRL (DRAPE/PACKS/SHEETS/OR TOWEL) ×1
GOWN SURG LRG STD LGTH REG L3 NONREINFORCE BRTHBL TWL STRL LF  DISP BLU HALYARD SPECTRUM SMS (DRAPE/PACKS/SHEETS/OR TOWEL) ×1 IMPLANT
GOWN SURG XL STD LGTH L3 NONREINFORCE HKLP CLSR TWL STRL LF (DRAPE/PACKS/SHEETS/OR TOWEL) ×1
GOWN SURG XL STD LGTH L3 NONREINFORCE HKLP CLSR TWL STRL LF  DISP BLU SPECTRUM SMS (DRAPE/PACKS/SHEETS/OR TOWEL) ×1
GOWN SURG XL STD LGTH L3 NONREINFORCE HKLP CLSR TWL STRL LF DISP BLU SPECTRUM SMS (DRAPE/PACKS/SHEETS/OR TOWEL) ×1 IMPLANT
HDPE THK22 UM C40-45 GL L48 IN X W40 IN NATURAL (MISCELLANEOUS PT CARE ITEMS) ×2 IMPLANT
LABEL MED CORRECT MED LABELING SYS 4 FLG 2 SHEET 24 PRPRNT (MED SURG SUPPLIES) ×1
LABEL MED CORRECT MED LABELING SYS 4 FLG 2 SHEET 24 PRPRNT STRL (MED SURG SUPPLIES) ×1 IMPLANT
LINER SUCT MEDIVAC CRD TW LOCK LID SHTOF VALVE CAN PORT 3L LF  DISP (MED SURG SUPPLIES) ×1 IMPLANT
LINER SUCT MEDIVAC CRD TW LOCK_LID SHTOF VALVE CAN PORT 3L (MED SURG SUPPLIES) ×1
PAD ABDOMINAL 7.5X8 STRL (WOUND CARE/ENTEROSTOMAL SUPPLY) ×2
PAD ABDOMINAL 8X7.5IN LF  STRL (WOUND CARE SUPPLY) ×2 IMPLANT
SOL IRRG 0.9% NACL 1000ML PLASTIC PR BTL PRSV FR DEHP-FR AQLT LF (MEDICATIONS/SOLUTIONS) ×1 IMPLANT
SOL IRRG 0.9% NACL 1000ML PRSV FR DEHP-FR STRL AQLT LF (MEDICATIONS/SOLUTIONS) ×1
SOLIDIFY FLUID 1500CC NONST LF  PREM SOLIDIFY + (MED SURG SUPPLIES) ×1 IMPLANT
SOLIDIFY FLUID 1500CC NONST LF PREM SOLIDIFY + (MED SURG SUPPLIES) ×1
SPONGE GAUZE 4X4IN MDCHC COTTON 12 PLY TY 7 LF  STRL DISP (WOUND CARE SUPPLY) ×2 IMPLANT
SPONGE GAUZE 4X4IN MDCHC COTTO_N 12 PLY TY 7 LF STRL DISP (WOUND CARE/ENTEROSTOMAL SUPPLY) ×2
SWAB BD BBL BD CLTSWB LIQUID STRT MED RYN TMPR EVD SEAL 2 ACT CAP RND BTM TUBE 5.25IN STRL CULT LF (MISCELLANEOUS PT CARE ITEMS) ×1 IMPLANT
SWAB BD CLTSWB LQ STRT MED CULT (MISCELLANEOUS PT CARE ITEMS) ×1
SYRINGE AMSURE MDCHC 60CC LF  STRL TIP PRTC SM TUBE ADPR IRRG DISC BULB POLYPROP (MED SURG SUPPLIES) ×1
SYRINGE AMSURE MDCHC 60CC LF STRL TIP PRTC SM TUBE ADPR (MED SURG SUPPLIES) ×1
TAPE DURAPORE 2IN 6/BX 1538-2 (WOUND CARE SUPPLY) ×1 IMPLANT
TAPE DURAPORE 2IN 6/BX 1538-2 (WOUND CARE/ENTEROSTOMAL SUPPLY) ×1
TOWEL 24X16IN COTTON BLU DISP SURG STRL LF (DRAPE/PACKS/SHEETS/OR TOWEL) ×4 IMPLANT
WRAP COMPRESS 5YDX6IN COBAN PO_R SLFADH ELAS LTWT HAND TEAR (WOUND CARE/ENTEROSTOMAL SUPPLY) ×1

## 2021-09-07 NOTE — Nurses Notes (Signed)
Patient back from procedure. Vital signs stable, see flowsheet. Dressing to right foot clean, dry, intact.

## 2021-09-07 NOTE — Nurses Notes (Signed)
Bed alarm going off. Nurse enters the room. Patient ripped out IV trying to use bedside commode. Previous IV site covered with a gauze dressing and tape. Attempted to start new IV. Patient refusing saying "We will just do it in the morning." Patient was educated on the importance of his IV fluids and medications. Patient still refuses to have IV inserted at this time.

## 2021-09-07 NOTE — Anesthesia Transfer of Care (Signed)
pacu staff to get one touch

## 2021-09-07 NOTE — Anesthesia Procedure Notes (Signed)
[facility]    Airway Note  General Information and Staff   Authorizing provider: Yevette Edwards, MD  Performing provider: Freddie Breech, CRNA        Difficult airway    Indications and Patient Condition  Pt location: In Or  Indications for airway management: anesthesia  Spontaneous Ventilation: absent  Sedation level: deep  Preoxygenated: yes  Patient position: sniffing  Mask difficulty assessment: 1 - vent by mask        Final Airway Details  Final airway type: endotracheal airway        Successful airway: ETT     Successful intubation technique: video laryngoscopy  Facilitating devices/methods: intubating stylet  Video scope brand: GlideScope     [place] Type: Adult       Endotracheal tube insertion site: oral  Blade: Macintosh  Blade size: #3  Airway size (mm): 7.5  Cormack-Lehane Classification: grade III - view of epiglottis only  Marked at 21  Measured from: lips  Secured with: Tape  Number of attempts at approach: 2  Ventilation between attempts: BVM  Number of other approaches attempted: 0Airway complications: Atraumatic

## 2021-09-07 NOTE — Anesthesia Transfer of Care (Signed)
ANESTHESIA TRANSFER OF CARE   Martin Porter is a 56 y.o. ,male, Weight: (!) 154 kg (340 lb 5 oz)   had Procedure(s):  DEBRIDEMENT RIGHT LOWER EXTREMITY  performed  09/07/21   Primary Service: Felecia Jan, MD    Past Medical History:   Diagnosis Date   . Abnormal prostate specific antigen    . Anemia, unspecified    . Atrial fibrillation (CMS HCC)    . Candida infection    . CKD (chronic kidney disease) stage 3, GFR 30-59 ml/min (CMS HCC)    . Cyst of kidney, acquired    . Diabetic peripheral neuropathy (CMS HCC)    . GERD (gastroesophageal reflux disease)    . Herpes simplex    . Hiatal hernia    . Hypogonadism in male    . Hypomagnesemia    . Impotence    . Legal blindness    . Mixed hyperlipidemia    . Obesity, unspecified     12/18 pt would like referral for bariatric surgery, would like to be seen by [facility] because of his hx of transplant   . Obstructive sleep apnea syndrome     4/18 compliant with CPAP, using nasal PILLOW MILD OSA. 12/17 MILD, WILL FOLLOW UP WITH SLEEP MED FOR CPAP MAY  BE BECAUSE FATIGUE   . Peptic ulcer     9/18 seen by gen surg/Reese, EGD and sono pending gastric ulcer, no neoplasm, H pylori negative 3/18 will need to decide on f/u EGD with Dr. Waynette Buttery   . Polyp of colon    . Renal transplant recipient    . Testicular hypofunction     5/18 followed by urology, they will follow PSA and testosterone levels prescribed gel 5/18 PSA 1.4 (slightly lower than last) followed by urology 5/18 PSA velocity increased form 2016-2017, testosterone held, was to have repeat PSA/VitD and T levels in our clinic and CC to Dr. Charlies Silvers Right testicular hypotrophy   . Tobacco dependence syndrome    . Type 2 diabetes mellitus without complications (CMS HCC)    . Unspecified glaucoma(365.9)    . Vitamin D deficiency       Allergy History as of 09/07/21      No Known Allergies              I completed my transfer of care / handoff to the receiving personnel during which we discussed:  Access, Airway, All  key/critical aspects of case discussed, Analgesia, Antibiotics, Expectation of post procedure, Fluids/Product, Gave opportunity for questions and acknowledgement of understanding, Labs and PMHx    Post Location: PACU                                       Report given to: Tawni Levy, RN   Additional Info:Resting with hob up 45 degrees, pt talking at interval to staff and trying to remove oxygen mask at times                        Last OR Temp: Temperature: 36.3 C (97.3 F)  ABG:  POTASSIUM   Date Value Ref Range Status   09/07/2021 5.0 3.5 - 5.1 mmol/L Final     KETONES   Date Value Ref Range Status   09/05/2021 Negative Negative mg/dL Final     CALCIUM   Date Value Ref Range Status   09/07/2021  8.8 8.6 - 10.3 mg/dL Final     Calculated P Axis   Date Value Ref Range Status   09/05/2021 67 degrees Final     Calculated R Axis   Date Value Ref Range Status   09/05/2021 -22 degrees Final     Calculated T Axis   Date Value Ref Range Status   09/05/2021 107 degrees Final     Airway:* No LDAs found *  Blood pressure (!) 145/93, pulse 70, temperature 36.3 C (97.3 F), resp. rate 16, height 1.854 m (6\' 1" ), weight (!) 154 kg (340 lb 5 oz), SpO2 93 %.

## 2021-09-07 NOTE — Consults (Signed)
Martin Porter 56 y.o. male 311/A   Date of Service: 09/07/2021    Date of Admission:  09/04/2021   PCP: Delaney Meigs, PA-C Code Status:Full Code       Reason for Consultation:  Acute renal failure, kidney transplant status  HPI:   56 year old gentleman with past medical history of kidney transplant for many years presenting to the hospital with pain and swelling associated with redness of the right lower extremity that started about 2 days ago.  Patient denies nausea or vomiting today but reports decrease in his oral intake.  Patient reports to me that usually have good kidney numbers his kidney transplant since 2015 did not have any major issues.  Patient denies missing his anti-rejection medications.  Takes only Prograf and Myfortic on regular basis.  He is not on steroids.  Lab work indicated creatinine of 4.4 BUN 59 potassium 4.2 carbon dioxide 24 calcium 9.2 magnesium 1.9.  Nephrology service was consulted for acute renal failure.    Today:  Patient is status post I&D on the right foot today.  He denies nausea or vomiting.  Appetite is good.  Patient denies edema.  No diarrhea.  No fevers or chills      ROS:   Systematic review of 12 organ systems was negative except what mentioned in in the HPI.      Current medications   acetaminophen (TYLENOL) tablet, 650 mg, Oral, Q4H PRN  amLODIPine (NORVASC) tablet, 5 mg, Oral, Daily  atenolol (TENORMIN) tablet, 50 mg, Oral, Daily  [Held by provider] atenolol (TENORMIN) tablet, 50 mg, Oral, 2x/day  atropine (ISOPTO ATROPINE) 1% ophthalmic solution, 1 Drop, Both Eyes, 4x/day  [Held by provider] carvedilol (COREG) tablet, 25 mg, Oral, 2x/day-Food  cefepime (MAXIPIME) 1 g in NS 50 mL IVPB minibag, 1 g, Intravenous, Q12H  cholecalciferol (VITAMIN D3) 1000 unit (25 mcg) tablet, 1,000 Units, Oral, Daily  dextrose 50% (0.5 g/mL) injection - syringe, 25 g, Intravenous, Q15 Min PRN   Or  dextrose 50%  (0.5 g/mL) injection - syringe, 12.5 g, Intravenous, Q15 Min PRN   Or  glucagon (GLUCAGEN DIAGNOSTIC KIT) injection 1 mg, 1 mg, Subcutaneous, Q15 Min PRN   Or  glucagon (GLUCAGEN DIAGNOSTIC KIT) injection 1 mg, 1 mg, IntraMUSCULAR, Q15 Min PRN  dorzolamide (TRUSOPT) 2 % ophthalmic solution, 1 Drop, Right Eye, 2x/day  famotidine (PEPCID) tablet, 40 mg, Oral, Daily  gabapentin (NEURONTIN) capsule, 200 mg, Oral, 3x/day  [Held by provider] heparin 5,000 unit/mL injection, 5,000 Units, Subcutaneous, Q12H  HYDROcodone-acetaminophen (NORCO) 5-325 mg per tablet, 1 Tablet, Oral, Q4H PRN  insulin glargine 100 units/mL injection, 35 Units, Subcutaneous, NIGHTLY  insulin glargine 100 units/mL injection, 45 Units, Subcutaneous, Daily with Breakfast  insulin lispro 100 units/mL injection, 25 Units, Subcutaneous, 2x/day AC  ipratropium-albuterol 0.5 mg-3 mg(2.5 mg base)/3 mL Solution for Nebulization, 3 mL, Nebulization, Q4H PRN  LR premix infusion, , Intravenous, Continuous  LR premix infusion, , Intravenous, Continuous  magnesium oxide (MAG-OX) 426m (2413melemental magnesium) tablet, 400 mg, Oral, 2x/day  methylPREDNISolone sod succ (SOLU-MEDROL) 40 mg/mL injection, 40 mg, Intravenous, Q8H  [Held by provider] mycophenolate (CELLCEPT) 250 mg capsule, 250 mg, Oral, 2X/day  [Held by provider] mycophenolate (MYFORTIC) delayed release tablet, 540 mg, Oral, 2X/day  nystatin (NYSTOP) 100,000 units/g topical powder, , Apply Topically, 2x/day  ondansetron (ZOFRAN) 2 mg/mL injection, 4 mg, Intravenous, Q6H PRN  pancreatic enzyme replacement (CREON) 12,000  units lipase per cap, 4 Capsule, Oral, 2x/day  pantoprazole (PROTONIX) delayed release tablet, 40 mg, Oral, Daily  pravastatin (PRAVACHOL) tablet, 40 mg, Oral, QPM  prednisoLONE acetate (PRED-FORTE) 1% ophthalmic suspension, 1 Drop, Both Eyes, 2x/day  SSIP insulin R human (HUMULIN R) 100 units/mL injection, 0-12 Units, Subcutaneous, 4x/day PRN  tacrolimus (PROGRAF) capsule, 2 mg, Oral,  2X/day  timolol (TIMOPTIC) 0.5% ophthalmic solution, 1 Drop, Right Eye, 2x/day  [Held by provider] trimethoprim-sulfamethoxazole (BACTRIM) 40-267m per 5 mL oral liquid, 80 mg, Oral, Q24H  vancomycin (VANCOCIN) 2,000 mg in NS 500 mL IVPB, 2,000 mg, Intravenous, Q36H  Vancomycin IV - Pharmacist to Dose per Protocol, , Does not apply, Daily PRN            Physical:  Filed Vitals:    09/07/21 1034 09/07/21 1045 09/07/21 1100 09/07/21 1542   BP:  (!) 115/56 126/88 (!) 148/64   Pulse: 72 72 73 73   Resp:  _0 Temp:   36.2 C (97.2 F) 36.8 C (98.2 F)   SpO2:  97% 96% 90%        Intake/Output Summary (Last 24 hours) at 09/07/2021 1750  Last data filed at 09/07/2021 1550  Gross per 24 hour   Intake 1020 ml   Output 550 ml   Net 470 ml        Patient is awake.  NAD  HEENT normocephalic atraumatic.  Eye exam normal inspection.    Mucous membranes moist, no jaundice.  Neck exam no JVD normal inspection.  Cardiovascular system: Regular rate and rhythm no murmurs rubs or gallops. No chest wall tenderness  Lungs: Clear to auscultation bilaterally no wheezing no rhonchi.  Abdomen soft nontender nondistended.  Right lower quadrant allograft nontender.  Obese  Extremities :  Right lower extremity +1 edema and tenderness to palpation.  Right foot is wrapped     Neuro exam: EOMI, normal speech    Labs:  CBC:     10.9*, 10.9 (05/06 0920) \   9.1* (05/06 0920) /   240 (05/06 0920)      / 26.6* (05/06 0920) \          BMP:   129* (05/06 0920) 97* (05/06 0920) 96* (05/06 0920)    /     368* (05/06 0920)   5.0 (05/06 0920) 23 (05/06 0920) 3.10* (05/06 0920) \                 Diagnostic studies:  Imaging:   CT EXTREMITY LOWER RIGHT WO IV CONTRAST  Narrative: Martin Porter    RADIOLOGIST: GBerton Bon MD    CT EXTREMITY LOWER RIGHT WO IV CONTRAST performed on 09/06/2021 1:01 PM    CLINICAL HISTORY: Cellulitis vs nec fasciitis. RADIOLOGIST: GBerton Bon MD    CT EXTREMITY LOWER RIGHT WO IV CONTRAST performed on 09/06/2021 1:01 PM    CLINICAL  HISTORY: Cellulitis vs nec fasciitis.  STEPPED ON POP CAN, LARGE OPEN SORE TO RIGHT ANKLE, AOI WRAPPED D/T WEEPING    TECHNIQUE:  Right lower leg/ankle CT without intravenous contrast.      COMPARISON: None.  # of known CTs in the past 12 months: 1   # of known Cardiac Nuclear Medicine Studies in the past 12 months: 0    FINDINGS:  Bones: No evidence of fracture.    Joints: Joint space(s) preserved.  No subluxation or dislocation.    Soft Tissues: There is soft tissue gas within the subcutaneous  tissues of the distal lower leg and ankle both posteriorly and laterally there is associated soft tissue edema within the subcutaneous fat. No deep drainable fluid collections are identified  Impression: 1.THERE IS SOFT TISSUE GAS PRESENT AT THE LEVEL OF THE DISTAL LOWER LEG AND ANKLE. ALTHOUGH THIS COULD BE DUE TO THE HISTORY OF A PENETRATING WOUND, AND INFECTION WITH A GAS-FORMING ORGANISM CANNOT BE EXCLUDED. NO DEEP DRAINABLE ABSCESS IS IDENTIFIED. NO OSTEOMYELITIS IS IDENTIFIED..    One or more dose reduction techniques were used (e.g., Automated exposure control, adjustment of the mA and/or kV according to patient size, use of iterative reconstruction technique).  STEPPED ON POP CAN, LARGE OPEN SORE TO RIGHT ANKLE, AOI WRAPPED D/T WEEPING    TECHNIQUE: CT without intravenous contrast.      COMPARISON: None.  # of known CTs in the past 12 months: 0   # of known Cardiac Nuclear Medicine Studies in the past 12 months: 0    FINDINGS:  Bones: No evidence of fracture.    Joints: Joint space(s) preserved.  No subluxation or dislocation.    Soft Tissues: Unremarkable.    IMPRESSION:    One or more dose reduction techniques were used (e.g., Automated exposure control, adjustment of the mA and/or kV according to patient size, use of iterative reconstruction technique).    Radiologist location ID: [id number]      Assessments:  Active Hospital Problems   (*Primary Problem)    Diagnosis   . *Cellulitis   . CKD (chronic kidney  disease) stage 3, GFR 30-59 ml/min (CMS HCC)   . Renal transplant recipient       Plan:     Acute renal failure on chronic kidney disease stage 3  -baseline creatinine is unavailable  -creatinine on admission 4.4  -creatinine today 4.69--> 3.1 condition improved  -continue IV fluids  -acute renal failure is prerenal in etiology  -UA was reviewed  -CT abdomen was reviewed we will benefit from repeat ultrasound as outpatient  -avoid all nephrotoxins.  Decrease vancomycin dose to 1750 every 36 hours and monitor vanc trough closely.    Kidney transplant status  -continue Prograf.  To mg b.i.d.  -hold CellCept till Tuesday morning  -patient will be on Solu-Medrol 40 t.i.d. IV till Tuesday morning  -watch blood sugars  -follow Prograf level    Edema and cellulitis  -rule out DVT.  Duplex negative for DVT  -IV antibiotics.  Vancomycin dose was decreased.  Please do not exceed 1750, consider changing to daptomycin if needed  -surgery on board.  -wound care.  Status post I and D                  Beather Arbour, MD, FASN, 09/07/2021, 17:50

## 2021-09-07 NOTE — Anesthesia Preprocedure Evaluation (Addendum)
ANESTHESIA PRE-OP EVALUATION  Planned Procedure: DEBRIDEMENT RIGHT LEG (Right: Leg)  Review of Systems                   Pulmonary   COPD, sleep apnea, current smoker and Smoked in last 24 hours,   Cardiovascular    Atrial fibrillation and hyperlipidemia ,       GI/Hepatic/Renal    S/p renal transplant  , hiatal hernia, GERD, PUD and end-stage renal disease        Endo/Other    anemia, obesity and morbid obesity,      Neuro/Psych/MS    Legally blind    peripheral neuropathy,  Cancer                    Physical Assessment      Airway       Mallampati: III      Mouth Opening: fair.            Dental                    Pulmonary           Cardiovascular             Other findings            Plan  ASA 4     Planned anesthesia type: general     general anesthesia with endotracheal tube intubation

## 2021-09-07 NOTE — Anesthesia Postprocedure Evaluation (Signed)
Anesthesia Post Op Evaluation    Patient: Martin Porter  Procedure(s):  DEBRIDEMENT RIGHT LOWER EXTREMITY    Last Vitals:Temperature: 36.3 C (97.3 F) (09/07/21 1030)  Heart Rate: 70 (09/07/21 1030)  BP (Non-Invasive): (!) 145/93 (09/07/21 1030)  Respiratory Rate: 16 (09/07/21 1030)  SpO2: 93 % (09/07/21 1030)    No notable events documented.      Patient location during evaluation: bedside       Patient participation: complete - patient participated  Level of consciousness: awake and alert    Pain management: satisfactory to patient  Airway patency: patent    Anesthetic complications: no  Cardiovascular status: hemodynamically stable  Respiratory status: acceptable  Hydration status: acceptable  Patient post-procedure temperature: Pt Normothermic   PONV Status: Absent

## 2021-09-07 NOTE — Anesthesia Procedure Notes (Signed)
Martin Porter    Airway Note  General Information and Staff   Authorizing provider: Delle Reining, MD  Performing provider: Fannie Knee, CRNA

## 2021-09-07 NOTE — Care Plan (Signed)
[place] Plan Note    Situation: Patient admitted with cellulitis.    Intervention: IV ABX, medication reconciliation, fluids, labs.     Response: Patient anxious about plan of care.       Lahoma Rocker, RN    Problem: Adult Inpatient Plan of Care  Goal: Patient-Specific Goal (Individualized)  Outcome: Ongoing (see interventions/notes)  Flowsheets (Taken 09/07/2021 0428)  Anxieties, Fears or Concerns: not getting the right medications

## 2021-09-07 NOTE — Progress Notes (Signed)
09/07/2021  11:05  Martin Porter  [id number]    History:  56 year old male with past medical history of morbid obesity, status post renal transplant 2005, poorly-controlled type 2 diabetes, fatty liver disease with suspected cirrhosis, hypertension, hyperlipidemia, presented to hospital as a transfer from [facility] emergency department for right heel wound infection as well as acute kidney injury.  The case was discussed with Nephrology who felt the patient could be admitted here to our hospital.    Today:  CT scan of the patient's foot showed potential free air and gas as well as significant edema and cellulitis.  Patient was taken for debridement this morning by surgery.  Seems to be feeling better today.  Much more pleasant this morning.  Renal function has significantly improved today.    Review of systems:  10 point review of systems was reviewed and negative except as pertinent negatives and positives mentioned in the interval history above.    Past Medical History  Pen Needle (Disposable), Sildenafil, amLODIPine, atenoloL, atropine, carvediloL, cholecalciferol (vitamin D3), dorzolamide-timoloL, famotidine, fenofibrate, gabapentin, hydroCHLOROthiazide, insulin aspart U-100, insulin glargine, iron ps complex-B12-folic acid, magnesium oxide, mycophenolate sodium, nystatin, ondansetron, pancreatic enzyme replacement, pantoprazole, potassium citrate, pravastatin, prednisoLONE acetate, sulfamethoxazole-trimethoprim, tacrolimus, and testosterone   No Known Allergies  Past Medical History:   Diagnosis Date   . Abnormal prostate specific antigen    . Anemia, unspecified    . Atrial fibrillation (CMS HCC)    . Candida infection    . CKD (chronic kidney disease) stage 3, GFR 30-59 ml/min (CMS HCC)    . Cyst of kidney, acquired    . Diabetic peripheral neuropathy (CMS HCC)    . GERD (gastroesophageal reflux disease)    . Herpes simplex    . Hiatal hernia    . Hypogonadism in male    . Hypomagnesemia    . Impotence    .  Legal blindness    . Mixed hyperlipidemia    . Obesity, unspecified     12/18 pt would like referral for bariatric surgery, would like to be seen by [facility] because of his hx of transplant   . Obstructive sleep apnea syndrome     4/18 compliant with CPAP, using nasal PILLOW MILD OSA. 12/17 MILD, WILL FOLLOW UP WITH SLEEP MED FOR CPAP MAY  BE BECAUSE FATIGUE   . Peptic ulcer     9/18 seen by gen surg/Reese, EGD and sono pending gastric ulcer, no neoplasm, H pylori negative 3/18 will need to decide on f/u EGD with Dr. Waynette Buttery   . Polyp of colon    . Renal transplant recipient    . Testicular hypofunction     5/18 followed by urology, they will follow PSA and testosterone levels prescribed gel 5/18 PSA 1.4 (slightly lower than last) followed by urology 5/18 PSA velocity increased form 2016-2017, testosterone held, was to have repeat PSA/VitD and T levels in our clinic and CC to Dr. Charlies Silvers Right testicular hypotrophy   . Tobacco dependence syndrome    . Type 2 diabetes mellitus without complications (CMS HCC)    . Unspecified glaucoma(365.9)    . Vitamin D deficiency          Past Surgical History:   Procedure Laterality Date   . COLONOSCOPY     . EYE SURGERY     . FOOT SURGERY     . HX HERNIA REPAIR     . HX RENAL TRANSPLANT      1/20 pt has fu  Feb 5th 12/19 Cr 1.69 GFR 45 6/19 Cr 1.41 GFR 56 3/19 Cr 1.67 GFR 46 11/18 seen by Dr. Marcy Panning, renal fx worsening, no comment on cause 11/18 Cr 1.65 GFR 478/18 Cr 1.95 GFR 38 6/18 Cr 1.5, Ca mildly high, SPE P/UPEPpending and asap f/u with nephrology 5/18 back to baseline cr 1.2 and GFR 68 CKD GFR 59 5/18 and was 59 11/17. Last Creat 1.36 increased from 1.2 11/17.   Marland Kitchen HX UPPER ENDOSCOPY     . KIDNEY SURGERY     . 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 It seems that pt underwent  pancreas tp to "cure" DM/islet cells  a few years ago  pt seen by endo and given OHG had pancreatitis, thought due to the DM medicines   . SHOULDER SURGERY           Family Medical History:     Problem Relation (Age of Onset)    COPD Father    Diabetes type II Mother, Father    Other Maternal Grandfather          Social History     Socioeconomic History   . Marital status: Single   Tobacco Use   . Smoking status: Never   . Smokeless tobacco: Never       Physical exam:  BP 126/88 Comment: Simultaneous filing. User may not have seen previous data.  Pulse 73 Comment: Simultaneous filing. User may not have seen previous data.  Temp 36.2 C (97.2 F)   Resp 18   Ht 1.854 m (6' 1")   Wt (!) 154 kg (340 lb 5 oz)   SpO2 96% Comment: Simultaneous filing. User may not have seen previous data.  BMI 44.90 kg/m       Gen: This is a 56 y.o.  Year-old male  who is awake alert and oriented x3 in no acute distress  CV:  Regular rate and rhythm without murmurs rubs or gallops.  2+ pedal and radial pulses. No clubbing, cyanosis, or edema.  RESP:  Clear to auscultation bilaterally without wheezes rales or rhonchi.  Respirations are nonlabored.  GI:  Abdomen is soft, nontender, nondistended.  Bowel sounds normoactive.  GU:  No Foley is present  MSK:  Can move all 4 extremities  NEURO:  Cranial nerves 2-12 grossly intact.  No focal deficits as tested.  SKIN:  Right lower extremity significantly swollen, discolored with erythema, sloughing skin.  Swelling slightly improved today    Diagnostic Labs:  Results for orders placed or performed during the hospital encounter of 09/04/21 (from the past 24 hour(s))   VANCOMYCIN, TROUGH   Result Value Ref Range    VANCOMYCIN TROUGH 20.9 (HH) 5.0 - 10.0 ug/mL   CBC/DIFF    Narrative    The following orders were created for panel order CBC/DIFF.  Procedure                               Abnormality         Status                     ---------                               -----------         ------  CBC WITH YKZL[[id number]]                 Abnormal            Final result               MANUAL DIFFERENTIAL[[id number]]                              In process                   Please view results for these tests on the individual orders.   COMPREHENSIVE METABOLIC PANEL, NON-FASTING   Result Value Ref Range    SODIUM 129 (L) 136 - 145 mmol/L    POTASSIUM 5.0 3.5 - 5.1 mmol/L    CHLORIDE 97 (L) 98 - 107 mmol/L    CO2 TOTAL 23 21 - 31 mmol/L    ANION GAP 9 (L) 10 - 20 mmol/L    BUN 96 (H) 7 - 25 mg/dL    CREATININE 3.10 (H) 0.60 - 1.30 mg/dL    BUN/CREA RATIO 31 (H) 6 - 22    ESTIMATED GFR 23 (L) >59 mL/min/1.35m2    ALBUMIN 3.1 (L) 3.5 - 5.7 g/dL    CALCIUM 8.8 8.6 - 10.3 mg/dL    GLUCOSE 368 (H) 74 - 109 mg/dL    ALKALINE PHOSPHATASE 103 34 - 104 U/L    ALT (SGPT) 37 7 - 52 U/L    AST (SGOT) 21 13 - 39 U/L    BILIRUBIN TOTAL 0.5 0.3 - 1.2 mg/dL    PROTEIN TOTAL 6.3 (L) 6.4 - 8.9 g/dL    ALBUMIN/GLOBULIN RATIO 1.0 0.8 - 1.4    OSMOLALITY, CALCULATED 304 (H) 270 - 290 mOsm/kg    CALCIUM, CORRECTED 9.7 8.9 - 10.8 mg/dL    GLOBULIN 3.2 2.9 - 5.4    Narrative    Estimated Glomerular Filtration Rate (eGFR) is calculated using the CKD-EPI (2021) equation, intended for patients 154years of age and older. If gender is not documented or "unknown", there will be no eGFR calculation.   MAGNESIUM   Result Value Ref Range    MAGNESIUM 2.7 1.9 - 2.7 mg/dL   CBC WITH DIFF   Result Value Ref Range    WBCS UNCORRECTED 10.9 x10^3/uL    WBC 10.9 (H) 4.0 - 10.5 x10^3/uL    RBC 2.84 (L) 4.20 - 6.00 x10^6/uL    HGB 9.1 (L) 13.5 - 18.0 g/dL    HCT 26.6 (L) 42.0 - 51.0 %    MCV 93.5 78.0 - 99.0 fL    MCH 32.2 (H) 27.0 - 32.0 pg    MCHC 34.4 32.0 - 36.0 g/dL    RDW 13.5 11.6 - 14.8 %    PLATELETS 240 140 - 440 x10^3/uL    MPV 11.2 (H) 7.4 - 10.4 fL   VANCOMYCIN, TROUGH   Result Value Ref Range    VANCOMYCIN TROUGH 15.8 (H) 5.0 - 10.0 ug/mL   POC BLOOD GLUCOSE (RESULTS)   Result Value Ref Range    GLUCOSE, POC 268 50 - 500 mg/dl   POC BLOOD GLUCOSE (RESULTS)   Result Value  Ref Range    GLUCOSE, POC 348 50 - 500 mg/dl       Diagnostic Imaging:  CT EXTREMITY LOWER RIGHT WO IV CONTRAST   Final Result   1.THERE IS SOFT TISSUE GAS PRESENT AT THE LEVEL OF THE DISTAL  LOWER LEG AND ANKLE. ALTHOUGH THIS COULD BE DUE TO THE HISTORY OF A PENETRATING WOUND, AND INFECTION WITH A GAS-FORMING ORGANISM CANNOT BE EXCLUDED. NO DEEP DRAINABLE ABSCESS IS IDENTIFIED. NO OSTEOMYELITIS IS IDENTIFIED..         One or more dose reduction techniques were used (e.g., Automated exposure control, adjustment of the mA and/or kV according to patient size, use of iterative reconstruction technique).   STEPPED ON POP CAN, LARGE OPEN SORE TO RIGHT ANKLE, AOI WRAPPED D/T WEEPING      TECHNIQUE: CT without intravenous contrast.        COMPARISON: None.   # of known CTs in the past 12 months: 0    # of known Cardiac Nuclear Medicine Studies in the past 12 months: 0      FINDINGS:   Bones: No evidence of fracture.      Joints: Joint space(s) preserved.  No subluxation or dislocation.      Soft Tissues: Unremarkable.         IMPRESSION:            One or more dose reduction techniques were used (e.g., Automated exposure control, adjustment of the mA and/or kV according to patient size, use of iterative reconstruction technique).         Radiologist location ID: [id number]         CT ABDOMEN PELVIS WO IV CONTRAST   Final Result   1.THERE IS SOME ILIAC CHAIN LYMPHADENOPATHY   2.SPLENOMEGALY   3.NONOBSTRUCTING STONES IN THE TRANSPLANTED KIDNEY   4.FATTY LIVER   5.SMALL HYPERDENSE LESION MID POLE RIGHT NATIVE KIDNEY COULD BE EVALUATED ELECTIVELY WITH A FOLLOW-UP ULTRASOUND          One or more dose reduction techniques were used (e.g., Automated exposure control, adjustment of the mA and/or kV according to patient size, use of iterative reconstruction technique).         Radiologist location ID: [id number]         XR CHEST AP   Final Result   NO ACUTE FINDINGS.         Radiologist location ID: [id number]               Assesment/Plan:  Patient Active Problem List   Diagnosis   . Hypomagnesemia   . Testicular hypofunction   . Type 2 diabetes mellitus without complications (CMS HCC)   . CKD (chronic kidney disease) stage 3, GFR 30-59 ml/min (CMS HCC)   . Anemia, unspecified   . Mixed hyperlipidemia   . Atrial fibrillation (CMS HCC)   . Diabetic peripheral neuropathy (CMS HCC)   . GERD (gastroesophageal reflux disease)   . Renal transplant recipient   . Impotence   . Hypogonadism in male   . Obstructive sleep apnea syndrome   . Candida infection   . Cellulitis        Sepsis present on admission   Right foot cellulitis  Continue with vancomycin and cefepime  CT scan evidence of gas  Duplex negative for DVT  Blood cultures negative  Wound culture growing Gram-negative rods and Gram-positive cocci  Taken for surgical debridement 5/6    Acute kidney injury   Suspected to be prerenal  Urine sediment benign  Renal function improved today    Abnormal lesion on kidney seen on CT scan  Will need follow-up ultrasound on outpatient basis    Hyponatremia   Secondary to renal failure and hyperglycemia, continue to monitor closely    Anemia  Normocytic  Continue to monitor closely, consider further workup outpatient    DVT Prophylaxis:  Subcutaneous heparin  Diet:  Diabetic  Physical Therapy:  Consulted  Disposition:  Home versus rehab    On the day of the encounter, a total of  45 minutes was spent on this patient encounter including review of historical information, examination, documentation and post-visit activities. The time documented excludes procedural time.    Celene Kras, DO

## 2021-09-07 NOTE — Nurses Notes (Signed)
Patient IV puffy and leaking. IV was removed and gauze placed over. Explained to patient that I would gather supplies to start a new one. Patient agreeable.  Patient's sister then calls the desk and states that he is not getting his IV medications. IV access was initiated and fluids continued.

## 2021-09-07 NOTE — OR Surgeon (Signed)
[facility]      Patient Name: Antonio, Creswell Number: [id number]  Date of Service: 09/07/2021   Date of Birth: 22-Nov-1965      Pre-Operative Diagnosis: ABSCESS RIGHT LOWER EXTREMITY     Post-Operative Diagnosis: ABSCESS RIGHT LOWER EXTREMITY    Procedure(s)/Description:  DEBRIDEMENT RIGHT LOWER EXTREMITY:      Findings: ABSCESS RIGHT LOWER EXTREMITY     Attending Surgeon: Esmond Plants, MD     Anesthesia:  Anesthesiologist: Delle Reining, MD  CRNA: Fannie Knee, CRNA    Anesthesia Type: .General     Estimated Blood Loss:  20 cc    Specimen: Cultures    Patient was taken to the operating room.  General anesthesia was introduced without difficulty.  Right lower extremity prepped and draped in usual sterile fashion.  The patient had a bullous necrotic area across the posterior aspect of his ankle near the insertion of the Achilles tendon.  This was excisionally debrided to underlying viable tendinous material removing all full-thickness skin and subcutaneous tissue.  Moderate amount of purulent material decompressed.  Cultures taken.  There was a 2nd ulceration just above the 1st 1 on the lateral aspect of his ankle.  This was circular shaped and was debrided back to normal subcutaneous tissue.    Electrocautery was used to obtain hemostasis.  Dressed using Betadine-soaked gauze.  Tolerated without difficulty.    Maura Crandall MD MBA CPE FACS

## 2021-09-07 NOTE — Nurses Notes (Signed)
Patient NPO for debridement with Dr. Sabino Gasser. Blood glucose level 319- Dr. Cliffton Asters notified. States to give scheduled Lantus and hold Lispro and sliding scale coverage.

## 2021-09-08 DIAGNOSIS — N183 Chronic kidney disease, stage 3 unspecified (CMS HCC): Secondary | ICD-10-CM

## 2021-09-08 DIAGNOSIS — L03115 Cellulitis of right lower limb: Principal | ICD-10-CM

## 2021-09-08 LAB — MANUAL DIFFERENTIAL
BAND %: 7 % (ref 5–11)
BANDS NEUTROPHILS MANUAL: 7
LYMPHOCYTE %: 13 % — ABNORMAL LOW (ref 25–45)
LYMPHOCYTE ABSOLUTE: 1.5 10*3/uL (ref 1.10–5.00)
LYMPHOCYTES MANUAL: 13
MONOCYTE %: 4 % (ref 0–12)
MONOCYTE ABSOLUTE: 0.46 10*3/uL (ref 0.00–1.30)
MONOCYTES MANUAL: 4
MYELOCYTE %: 3 %
MYELOCYTE ABSOLUTE: 0.35 10*3/uL
MYELOCYTES MANUAL: 3
NEUTROPHIL %: 73 % (ref 40–76)
NEUTROPHIL ABSOLUTE: 9.2 10*3/uL — ABNORMAL HIGH (ref 1.80–8.40)
NEUTROPHILS MANUAL: 73
PLATELET MORPHOLOGY COMMENT: NORMAL
TOTAL CELLS COUNTED [#] IN BLOOD: 100
WBC: 11.5 10*3/uL

## 2021-09-08 LAB — CBC WITH DIFF
HCT: 28.3 % — ABNORMAL LOW (ref 42.0–51.0)
HGB: 9.6 g/dL — ABNORMAL LOW (ref 13.5–18.0)
MCH: 31.3 pg (ref 27.0–32.0)
MCHC: 33.8 g/dL (ref 32.0–36.0)
MCV: 92.5 fL (ref 78.0–99.0)
MPV: 10.4 fL (ref 7.4–10.4)
PLATELETS: 313 10*3/uL (ref 140–440)
RBC: 3.06 10*6/uL — ABNORMAL LOW (ref 4.20–6.00)
RDW: 13.7 % (ref 11.6–14.8)
WBC: 11.5 10*3/uL — ABNORMAL HIGH (ref 4.0–10.5)
WBCS UNCORRECTED: 11.5 10*3/uL

## 2021-09-08 LAB — COMPREHENSIVE METABOLIC PANEL, NON-FASTING
ALBUMIN/GLOBULIN RATIO: 1 (ref 0.8–1.4)
ALBUMIN: 3.2 g/dL — ABNORMAL LOW (ref 3.5–5.7)
ALKALINE PHOSPHATASE: 93 U/L (ref 34–104)
ALT (SGPT): 31 U/L (ref 7–52)
ANION GAP: 7 mmol/L — ABNORMAL LOW (ref 10–20)
AST (SGOT): 13 U/L (ref 13–39)
BILIRUBIN TOTAL: 0.5 mg/dL (ref 0.3–1.2)
BUN/CREA RATIO: 43 — ABNORMAL HIGH (ref 6–22)
BUN: 88 mg/dL — ABNORMAL HIGH (ref 7–25)
CALCIUM, CORRECTED: 10 mg/dL (ref 8.9–10.8)
CALCIUM: 9.2 mg/dL (ref 8.6–10.3)
CHLORIDE: 104 mmol/L (ref 98–107)
CO2 TOTAL: 28 mmol/L (ref 21–31)
CREATININE: 2.06 mg/dL — ABNORMAL HIGH (ref 0.60–1.30)
ESTIMATED GFR: 37 mL/min/{1.73_m2} — ABNORMAL LOW (ref >59)
GLOBULIN: 3.2 (ref 2.9–5.4)
GLUCOSE: 141 mg/dL — ABNORMAL HIGH (ref 74–109)
OSMOLALITY, CALCULATED: 307 mOsm/kg — ABNORMAL HIGH (ref 270–290)
POTASSIUM: 4.8 mmol/L (ref 3.5–5.1)
PROTEIN TOTAL: 6.4 g/dL (ref 6.4–8.9)
SODIUM: 139 mmol/L (ref 136–145)

## 2021-09-08 LAB — MAGNESIUM: MAGNESIUM: 2.7 mg/dL (ref 1.9–2.7)

## 2021-09-08 MED ORDER — TACROLIMUS 1 MG CAPSULE, IMMEDIATE-RELEASE
2.0000 mg | ORAL_CAPSULE | Freq: Two times a day (BID) | ORAL | Status: DC
Start: 2021-09-08 — End: 2021-09-10
  Administered 2021-09-08 – 2021-09-10 (×4): 2 mg via ORAL
  Filled 2021-09-08 (×6): qty 2

## 2021-09-08 MED ORDER — SODIUM CHLORIDE 0.9 % INTRAVENOUS PIGGYBACK
2.0000 g | Freq: Two times a day (BID) | INTRAVENOUS | Status: DC
Start: 2021-09-08 — End: 2021-09-10
  Administered 2021-09-08: 2 g via INTRAVENOUS
  Administered 2021-09-08: 0 g via INTRAVENOUS
  Administered 2021-09-09 (×2): 2 g via INTRAVENOUS
  Administered 2021-09-09 – 2021-09-10 (×3): 0 g via INTRAVENOUS
  Administered 2021-09-10: 2 g via INTRAVENOUS
  Administered 2021-09-10: 0 g via INTRAVENOUS
  Filled 2021-09-08 (×4): qty 12.5

## 2021-09-08 MED ORDER — LINEZOLID 600 MG TABLET
600.0000 mg | ORAL_TABLET | Freq: Two times a day (BID) | ORAL | Status: DC
Start: 2021-09-08 — End: 2021-09-10
  Administered 2021-09-08 – 2021-09-10 (×4): 600 mg via ORAL
  Filled 2021-09-08 (×4): qty 1

## 2021-09-08 MED ORDER — METHYLPREDNISOLONE SOD SUCCINATE 40 MG/ML SOLUTION FOR INJ. WRAPPER
40.0000 mg | Freq: Two times a day (BID) | INTRAMUSCULAR | Status: DC
Start: 2021-09-08 — End: 2021-09-09
  Administered 2021-09-08 – 2021-09-09 (×2): 40 mg via INTRAVENOUS
  Filled 2021-09-08 (×2): qty 1

## 2021-09-08 MED ORDER — TACROLIMUS 1 MG CAPSULE, IMMEDIATE-RELEASE
3.0000 mg | ORAL_CAPSULE | Freq: Two times a day (BID) | ORAL | Status: DC
Start: 2021-09-08 — End: 2021-09-08
  Filled 2021-09-08 (×2): qty 3

## 2021-09-08 NOTE — Progress Notes (Signed)
[facility]  General Surgery  Follow-Up Consultation    Date of Service:  09/08/2021  Martin Porter, Martin Porter, 56 y.o. male  Date of Admission:  09/04/2021  Date of Birth:  Apr 18, 1966  PCP: Delaney Meigs, PA-C    HPI:  Post op debridement right posterior heel/ankle.  No complaints at this time.       Filed Vitals:    09/07/21 2015 09/07/21 2217 09/08/21 0052 09/08/21 0813   BP: (!) 117/57  (!) 154/53 (!) 162/62   Pulse: 75 74 79 77   Resp: 18  18 18    Temp:   36.6 C (97.8 F) 36.8 C (98.2 F)   SpO2: 93%  91% 96%       General: appropriate for age. in no acute distress.    Vital signs are present above and have been reviewed by me     Dressing removed.  The patient does not have any further gangrenous changes.  There is no expressible drainage.  Still a moderate amount of induration more concentrated on the lateral aspect of the ankle than the posterior or medial but overall the induration is improved since yesterday.    Laboratory Data:     CBC  Diff   Lab Results   Component Value Date/Time    WBC 11.5 (H) 09/08/2021 07:38 AM    WBC 11.5 09/08/2021 07:38 AM    HGB 9.6 (L) 09/08/2021 07:38 AM    HCT 28.3 (L) 09/08/2021 07:38 AM    PLTCNT 313 09/08/2021 07:38 AM    RBC 3.06 (L) 09/08/2021 07:38 AM    MCV 92.5 09/08/2021 07:38 AM    MCHC 33.8 09/08/2021 07:38 AM    MCH 31.3 09/08/2021 07:38 AM    RDW 13.7 09/08/2021 07:38 AM    MPV 10.4 09/08/2021 07:38 AM    Lab Results   Component Value Date/Time    PMNS 73 09/08/2021 07:38 AM    LYMPHOCYTES 13 (L) 09/08/2021 07:38 AM    EOSINOPHIL 0 09/06/2021 05:56 AM    MONOCYTES 4 09/08/2021 07:38 AM    BASOPHILS 0 09/06/2021 05:56 AM    BASOPHILS 0.00 09/06/2021 05:56 AM    PMNABS 9.00 (H) 09/06/2021 05:56 AM    LYMPHSABS 0.50 (L) 09/06/2021 05:56 AM    EOSABS 0.00 09/06/2021 05:56 AM    MONOSABS 0.30 09/06/2021 05:56 AM             Basic Metabolic Profile    Lab Results   Component Value Date/Time    SODIUM 139 09/08/2021 07:38 AM    POTASSIUM 4.8 09/08/2021  07:38 AM    CHLORIDE 104 09/08/2021 07:38 AM    CO2 28 09/08/2021 07:38 AM    ANIONGAP 7 (L) 09/08/2021 07:38 AM    Lab Results   Component Value Date/Time    BUN 88 (H) 09/08/2021 07:38 AM    CREATININE 2.06 (H) 09/08/2021 07:38 AM    GLUCOSENF 141 (H) 09/08/2021 07:38 AM               Assessment/Plan:  Active Hospital Problems    Diagnosis   . Primary Problem: Cellulitis   . CKD (chronic kidney disease) stage 3, GFR 30-59 ml/min (CMS HCC)   . Renal transplant recipient        Cellulitis     Start dressing changes.  Definitely improved.  Has full range of motion to his ankle.  Do not feel that we need any other surgeon intervention at the  current time.    Bridgett Larsson MD MBA CPE FACS        This note was partially created using voice recognition software and is inherently subject to errors including those of syntax and "sound alike " substitutions which may escape proof reading. In such instances, original meaning may be extrapolated by contextual derivation.

## 2021-09-08 NOTE — Consults (Signed)
Martin Porter 56 y.o. male 311/A   Date of Service: 09/08/2021    Date of Admission:  09/04/2021   PCP: Delaney Meigs, PA-C Code Status:Full Code       Reason for Consultation:  Acute renal failure, kidney transplant status  HPI:   56 year old gentleman with past medical history of kidney transplant for many years presenting to the hospital with pain and swelling associated with redness of the right lower extremity that started about 2 days ago.  Patient denies nausea or vomiting today but reports decrease in his oral intake.  Patient reports to me that usually have good kidney numbers his kidney transplant since 2015 did not have any major issues.  Patient denies missing his anti-rejection medications.  Takes only Prograf and Myfortic on regular basis.  He is not on steroids.  Lab work indicated creatinine of 4.4 BUN 59 potassium 4.2 carbon dioxide 24 calcium 9.2 magnesium 1.9.  Nephrology service was consulted for acute renal failure.    Today:  Patient is status post I&D on the right foot pod#1   He denies nausea or vomiting.  Appetite is good.  Patient denies edema.  No diarrhea.  No fevers or chills      ROS:   Systematic review of 12 organ systems was negative except what mentioned in in the HPI.      Current medications   acetaminophen (TYLENOL) tablet, 650 mg, Oral, Q4H PRN  amLODIPine (NORVASC) tablet, 5 mg, Oral, Daily  atenolol (TENORMIN) tablet, 50 mg, Oral, Daily  atropine (ISOPTO ATROPINE) 1% ophthalmic solution, 1 Drop, Both Eyes, 4x/day  cefepime (MAXIPIME) 2 g in NS 50 mL IVPB minibag, 2 g, Intravenous, Q12H  cholecalciferol (VITAMIN D3) 1000 unit (25 mcg) tablet, 1,000 Units, Oral, Daily  dextrose 50% (0.5 g/mL) injection - syringe, 25 g, Intravenous, Q15 Min PRN   Or  dextrose 50% (0.5 g/mL) injection - syringe, 12.5 g, Intravenous, Q15 Min PRN   Or  glucagon (GLUCAGEN DIAGNOSTIC KIT) injection 1 mg, 1 mg,  Subcutaneous, Q15 Min PRN   Or  glucagon (GLUCAGEN DIAGNOSTIC KIT) injection 1 mg, 1 mg, IntraMUSCULAR, Q15 Min PRN  dorzolamide (TRUSOPT) 2 % ophthalmic solution, 1 Drop, Right Eye, 2x/day  famotidine (PEPCID) tablet, 40 mg, Oral, Daily  gabapentin (NEURONTIN) capsule, 200 mg, Oral, 3x/day  [Held by provider] heparin 5,000 unit/mL injection, 5,000 Units, Subcutaneous, Q12H  HYDROcodone-acetaminophen (NORCO) 5-325 mg per tablet, 1 Tablet, Oral, Q4H PRN  insulin glargine 100 units/mL injection, 35 Units, Subcutaneous, NIGHTLY  insulin glargine 100 units/mL injection, 45 Units, Subcutaneous, Daily with Breakfast  insulin lispro 100 units/mL injection, 25 Units, Subcutaneous, 2x/day AC  ipratropium-albuterol 0.5 mg-3 mg(2.5 mg base)/3 mL Solution for Nebulization, 3 mL, Nebulization, Q4H PRN  LR premix infusion, , Intravenous, Continuous  magnesium oxide (MAG-OX) 474m (2446melemental magnesium) tablet, 400 mg, Oral, 2x/day  methylPREDNISolone sod succ (SOLU-MEDROL) 40 mg/mL injection, 40 mg, Intravenous, Q12H  [Held by provider] mycophenolate (CELLCEPT) 250 mg capsule, 250 mg, Oral, 2X/day  [Held by provider] mycophenolate (MYFORTIC) delayed release tablet, 540 mg, Oral, 2X/day  nystatin (NYSTOP) 100,000 units/g topical powder, , Apply Topically, 2x/day  ondansetron (ZOFRAN) 2 mg/mL injection, 4 mg, Intravenous, Q6H PRN  pancreatic enzyme replacement (CREON) 12,000 units lipase per cap, 4 Capsule, Oral, 2x/day  pantoprazole (PROTONIX) delayed release tablet, 40 mg, Oral, Daily  pravastatin (PRAVACHOL) tablet, 40 mg, Oral, QPM  prednisoLONE  acetate (PRED-FORTE) 1% ophthalmic suspension, 1 Drop, Both Eyes, 2x/day  SSIP insulin R human (HUMULIN R) 100 units/mL injection, 0-12 Units, Subcutaneous, 4x/day PRN  tacrolimus (PROGRAF) capsule, 3 mg, Oral, 2X/day  timolol (TIMOPTIC) 0.5% ophthalmic solution, 1 Drop, Right Eye, 2x/day  [Held by provider] trimethoprim-sulfamethoxazole (BACTRIM) 40-275m per 5 mL oral liquid, 80  mg, Oral, Q24H  [START ON 09/09/2021] vancomycin (VANCOCIN) 1,750 mg in NS 500 mL IVPB, 15 mg/kg (Adjusted), Intravenous, Q36H  Vancomycin IV - Pharmacist to Dose per Protocol, , Does not apply, Daily PRN            Physical:  Filed Vitals:    09/08/21 0813 09/08/21 1026 09/08/21 1555 09/08/21 1617   BP: (!) 162/62  (!) 169/75    Pulse: 77 79 72    Resp: _0 Temp: 36.8 C (98.2 F)  36.6 C (97.9 F)    SpO2: 96%  94%         Intake/Output Summary (Last 24 hours) at 09/08/2021 1649  Last data filed at 09/08/2021 1553  Gross per 24 hour   Intake 850 ml   Output 1000 ml   Net -150 ml        Patient is awake.  NAD  HEENT normocephalic atraumatic.  Eye exam normal inspection.    Mucous membranes moist, no jaundice.  Neck exam no JVD normal inspection.  Cardiovascular system: Regular rate and rhythm no murmurs rubs or gallops. No chest wall tenderness  Lungs: Clear to auscultation bilaterally no wheezing no rhonchi.  Abdomen soft nontender nondistended.  Right lower quadrant allograft nontender.  Obese  Extremities :  Right lower extremity +trace edema and tenderness to palpation.  Right foot/ankle wound noted    Neuro exam: EOMI, normal speech    Labs:  CBC:     11.5*, 11.5 (05/07 0738) \   9.6* (05/07 00962 /   313 (05/07 08366      / 28.3* (05/07 0738) \          BMP:   139 (05/07 02947 104 (05/07 0738) 88* (05/07 0738)    /     141* (05/07 0738)   4.8 (05/07 0738) 28 (05/07 0738) 2.06* (05/07 0738) \                 Diagnostic studies:  Imaging:   CT EXTREMITY LOWER RIGHT WO IV CONTRAST  Narrative: Martin Porter    RADIOLOGIST: GBerton Bon MD    CT EXTREMITY LOWER RIGHT WO IV CONTRAST performed on 09/06/2021 1:01 PM    CLINICAL HISTORY: Cellulitis vs nec fasciitis. RADIOLOGIST: GBerton Bon MD    CT EXTREMITY LOWER RIGHT WO IV CONTRAST performed on 09/06/2021 1:01 PM    CLINICAL HISTORY: Cellulitis vs nec fasciitis.  STEPPED ON POP CAN, LARGE OPEN SORE TO RIGHT ANKLE, AOI WRAPPED D/T WEEPING    TECHNIQUE:  Right lower  leg/ankle CT without intravenous contrast.      COMPARISON: None.  # of known CTs in the past 12 months: 1   # of known Cardiac Nuclear Medicine Studies in the past 12 months: 0    FINDINGS:  Bones: No evidence of fracture.    Joints: Joint space(s) preserved.  No subluxation or dislocation.    Soft Tissues: There is soft tissue gas within the subcutaneous tissues of the distal lower leg and ankle both posteriorly and laterally there is associated soft tissue edema within the subcutaneous fat. No deep drainable  fluid collections are identified  Impression: 1.THERE IS SOFT TISSUE GAS PRESENT AT THE LEVEL OF THE DISTAL LOWER LEG AND ANKLE. ALTHOUGH THIS COULD BE DUE TO THE HISTORY OF A PENETRATING WOUND, AND INFECTION WITH A GAS-FORMING ORGANISM CANNOT BE EXCLUDED. NO DEEP DRAINABLE ABSCESS IS IDENTIFIED. NO OSTEOMYELITIS IS IDENTIFIED..    One or more dose reduction techniques were used (e.g., Automated exposure control, adjustment of the mA and/or kV according to patient size, use of iterative reconstruction technique).  STEPPED ON POP CAN, LARGE OPEN SORE TO RIGHT ANKLE, AOI WRAPPED D/T WEEPING    TECHNIQUE: CT without intravenous contrast.      COMPARISON: None.  # of known CTs in the past 12 months: 0   # of known Cardiac Nuclear Medicine Studies in the past 12 months: 0    FINDINGS:  Bones: No evidence of fracture.    Joints: Joint space(s) preserved.  No subluxation or dislocation.    Soft Tissues: Unremarkable.    IMPRESSION:    One or more dose reduction techniques were used (e.g., Automated exposure control, adjustment of the mA and/or kV according to patient size, use of iterative reconstruction technique).    Radiologist location ID: [id number]      Assessments:  Active Hospital Problems   (*Primary Problem)    Diagnosis   . *Cellulitis   . CKD (chronic kidney disease) stage 3, GFR 30-59 ml/min (CMS HCC)   . Renal transplant recipient       Plan:     Acute renal failure on chronic kidney disease  stage 3  -baseline creatinine is unavailable  -creatinine on admission 4.4  -creatinine today 4.69--> 3.1--. 2.0 condition improved  -d/c IVF   -acute renal failure is prerenal in etiology  -UA was reviewed  -CT abdomen was reviewed we will benefit from repeat ultrasound as outpatient  -avoid all nephrotoxins.    -d/c vanco start zyvox     Kidney transplant status  -continue Prograf.  2 mg b.i.d. patient confirmed to me that this is his current home dose and it is not 3 mg  - may resume CellCept on 09-09-21 evening  -patient will be on Solu-Medrol 40 t.i.d.---. > bid  IV till x 24 hrs   -watch blood sugars  -follow Prograf level    Edema and cellulitis  -rule out DVT.  Duplex negative for DVT  -IV antibiotics.  D/c vanco , start zyvox x 14 doses  -increase cefepime to 2 gr IV bid   -surgery on board.  -wound care.    -Status post I and D                  Beather Arbour, MD, FASN, 09/08/2021, 16:49

## 2021-09-08 NOTE — Progress Notes (Signed)
09/08/2021  17:01  Martin Porter  [id number]    History:  56 year old male with past medical history of morbid obesity, status post renal transplant 2005, poorly-controlled type 2 diabetes, fatty liver disease with suspected cirrhosis, hypertension, hyperlipidemia, presented to hospital as a transfer from [facility] emergency department for right heel wound infection as well as acute kidney injury.  The case was discussed with Nephrology who felt the patient could be admitted here to our hospital. Patient was started on hydration and renal function improved over the next couple days.  CT scan of the patient's foot showed potential free air and gas as well as significant edema and cellulitis.  Patient was taken for debridement 5/6.    Today:  He seems to be doing well.     Review of systems:  10 point review of systems was reviewed and negative except as pertinent negatives and positives mentioned in the interval history above.    Past Medical History  Pen Needle (Disposable), amLODIPine, atenoloL, atropine, carvediloL, cholecalciferol (vitamin D3), dorzolamide-timoloL, famotidine, fenofibrate, gabapentin, hydroCHLOROthiazide, insulin aspart U-100, insulin glargine, iron ps complex-B12-folic acid, magnesium oxide, mycophenolate sodium, nystatin, ondansetron, pancreatic enzyme replacement, pantoprazole, potassium citrate, pravastatin, prednisoLONE acetate, sulfamethoxazole-trimethoprim, tacrolimus, and testosterone   No Known Allergies  Past Medical History:   Diagnosis Date   . Abnormal prostate specific antigen    . Anemia, unspecified    . Atrial fibrillation (CMS HCC)    . Candida infection    . CKD (chronic kidney disease) stage 3, GFR 30-59 ml/min (CMS HCC)    . Cyst of kidney, acquired    . Diabetic peripheral neuropathy (CMS HCC)    . GERD (gastroesophageal reflux disease)    . Herpes simplex    . Hiatal hernia    . Hypogonadism in male    . Hypomagnesemia    . Impotence    . Legal blindness    . Mixed  hyperlipidemia    . Obesity, unspecified     12/18 pt would like referral for bariatric surgery, would like to be seen by [facility] because of his hx of transplant   . Obstructive sleep apnea syndrome     4/18 compliant with CPAP, using nasal PILLOW MILD OSA. 12/17 MILD, WILL FOLLOW UP WITH SLEEP MED FOR CPAP MAY  BE BECAUSE FATIGUE   . Peptic ulcer     9/18 seen by gen surg/Reese, EGD and sono pending gastric ulcer, no neoplasm, H pylori negative 3/18 will need to decide on f/u EGD with Dr. Waynette Buttery   . Polyp of colon    . Renal transplant recipient    . Testicular hypofunction     5/18 followed by urology, they will follow PSA and testosterone levels prescribed gel 5/18 PSA 1.4 (slightly lower than last) followed by urology 5/18 PSA velocity increased form 2016-2017, testosterone held, was to have repeat PSA/VitD and T levels in our clinic and CC to Dr. Charlies Silvers Right testicular hypotrophy   . Tobacco dependence syndrome    . Type 2 diabetes mellitus without complications (CMS HCC)    . Unspecified glaucoma(365.9)    . Vitamin D deficiency          Past Surgical History:   Procedure Laterality Date   . COLONOSCOPY     . EYE SURGERY     . FOOT SURGERY     . HX HERNIA REPAIR     . HX RENAL TRANSPLANT      1/20 pt has fu Feb 5th  12/19 Cr 1.69 GFR 45 6/19 Cr 1.41 GFR 56 3/19 Cr 1.67 GFR 46 11/18 seen by Dr. Marcy Panning, renal fx worsening, no comment on cause 11/18 Cr 1.65 GFR 478/18 Cr 1.95 GFR 38 6/18 Cr 1.5, Ca mildly high, SPE P/UPEPpending and asap f/u with nephrology 5/18 back to baseline cr 1.2 and GFR 68 CKD GFR 59 5/18 and was 59 11/17. Last Creat 1.36 increased from 1.2 11/17.   Marland Kitchen HX UPPER ENDOSCOPY     . KIDNEY SURGERY     . 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 It seems that pt underwent  pancreas tp to "cure" DM/islet cells  a few years ago pt seen by endo and given  OHG had pancreatitis, thought due to the DM medicines   . SHOULDER SURGERY           Family Medical History:     Problem Relation (Age of Onset)    COPD Father    Diabetes type II Mother, Father    Other Maternal Grandfather          Social History     Socioeconomic History   . Marital status: Single   Tobacco Use   . Smoking status: Never   . Smokeless tobacco: Never       Physical exam:  BP (!) 169/75   Pulse 72   Temp 36.6 C (97.9 F)   Resp 18   Ht 1.854 m ('6\' 1"' )   Wt (!) 154 kg (340 lb 5 oz)   SpO2 94%   BMI 44.90 kg/m       Gen: This is a 56 y.o.  Year-old male  who is awake alert and oriented x3 in no acute distress  CV:  Regular rate and rhythm without murmurs rubs or gallops.  2+ pedal and radial pulses. No clubbing, cyanosis, or edema.  RESP:  Clear to auscultation bilaterally without wheezes rales or rhonchi.  Respirations are nonlabored.  GI:  Abdomen is soft, nontender, nondistended.  Bowel sounds normoactive.  GU:  No Foley is present  MSK:  Can move all 4 extremities  NEURO:  Cranial nerves 2-12 grossly intact.  No focal deficits as tested.  SKIN:  Right lower extremity significantly swollen, discolored with erythema, sloughing skin.  Swelling slightly improved today    Diagnostic Labs:  Results for orders placed or performed during the hospital encounter of 09/04/21 (from the past 24 hour(s))   CBC/DIFF    Narrative    The following orders were created for panel order CBC/DIFF.  Procedure                               Abnormality         Status                     ---------                               -----------         ------                     CBC WITH OQHU[[id number]]  Abnormal            Final result               MANUAL DIFFERENTIAL[[id number]]          Abnormal            Final result                 Please view results for these tests on the individual orders.   COMPREHENSIVE METABOLIC PANEL, NON-FASTING   Result Value Ref Range    SODIUM 139 136 - 145 mmol/L     POTASSIUM 4.8 3.5 - 5.1 mmol/L    CHLORIDE 104 98 - 107 mmol/L    CO2 TOTAL 28 21 - 31 mmol/L    ANION GAP 7 (L) 10 - 20 mmol/L    BUN 88 (H) 7 - 25 mg/dL    CREATININE 2.06 (H) 0.60 - 1.30 mg/dL    BUN/CREA RATIO 43 (H) 6 - 22    ESTIMATED GFR 37 (L) >59 mL/min/1.29m2    ALBUMIN 3.2 (L) 3.5 - 5.7 g/dL    CALCIUM 9.2 8.6 - 10.3 mg/dL    GLUCOSE 141 (H) 74 - 109 mg/dL    ALKALINE PHOSPHATASE 93 34 - 104 U/L    ALT (SGPT) 31 7 - 52 U/L    AST (SGOT) 13 13 - 39 U/L    BILIRUBIN TOTAL 0.5 0.3 - 1.2 mg/dL    PROTEIN TOTAL 6.4 6.4 - 8.9 g/dL    ALBUMIN/GLOBULIN RATIO 1.0 0.8 - 1.4    OSMOLALITY, CALCULATED 307 (H) 270 - 290 mOsm/kg    CALCIUM, CORRECTED 10.0 8.9 - 10.8 mg/dL    GLOBULIN 3.2 2.9 - 5.4    Narrative    Estimated Glomerular Filtration Rate (eGFR) is calculated using the CKD-EPI (2021) equation, intended for patients 142years of age and older. If gender is not documented or "unknown", there will be no eGFR calculation.   MAGNESIUM   Result Value Ref Range    MAGNESIUM 2.7 1.9 - 2.7 mg/dL   CBC WITH DIFF   Result Value Ref Range    WBCS UNCORRECTED 11.5 x10^3/uL    WBC 11.5 (H) 4.0 - 10.5 x10^3/uL    RBC 3.06 (L) 4.20 - 6.00 x10^6/uL    HGB 9.6 (L) 13.5 - 18.0 g/dL    HCT 28.3 (L) 42.0 - 51.0 %    MCV 92.5 78.0 - 99.0 fL    MCH 31.3 27.0 - 32.0 pg    MCHC 33.8 32.0 - 36.0 g/dL    RDW 13.7 11.6 - 14.8 %    PLATELETS 313 140 - 440 x10^3/uL    MPV 10.4 7.4 - 10.4 fL   MANUAL DIFFERENTIAL   Result Value Ref Range    WBC 11.5 x10^3/uL    NEUTROPHIL % 73 40 - 76 %    LYMPHOCYTE % 13 (L) 25 - 45 %    MONOCYTE % 4 0 - 12 %    EOSINOPHIL %      BASOPHIL %      METAMYELOCYTE %      MYELOCYTE % 3 %    PROMYELOCYTE %      BAND % 7 5 - 11 %    BLAST %      OTHER %      NEUTROPHIL ABSOLUTE 9.20 (H) 1.80 - 8.40 x10^3/uL    LYMPHOCYTE ABSOLUTE 1.50 1.10 - 5.00 x10^3/uL    MONOCYTE ABSOLUTE 0.46  0.00 - 1.30 x10^3/uL    EOSINOPHIL ABSOLUTE      BASOPHIL ABSOLUTE      METAMYELOCYTE ABSOLUTE      MYELOCYTE ABSOLUTE 0.35 x10^3/uL     PROMYELOCYTE ABSOLUTE      BLAST ABSOLUTE      OTHER CELL ABSOLUTE      ANISOCYTOSIS      POLYCHROMASIA      POIKILOCYTOSIS      BASOPHILIC STIPPLING      MICROCYTOSIS      MACROCYTOSIS      ROULEAUX      SCHISTOCYTES      SPHEROCYTES      TARGET CELLS      TEARDROP CELLS      OVALOCYTE (ELLIPTOCYTE)      CRENATED RED CELLS      STOMATOCYTES Moderate     ACANTHOCYTES (SPUR CELL)      ECHINOCYTE (BURR CELL)      BLISTER CELLS      RBC AGGLUTINATES      HOWELL JOLLY BODIES      ATYPICAL LYMPHOCYTES      TOXIC GRANULATION      DOHLE BODIES      TOXIC VACUOLIZATION      AUER RODS      BASKET CELLS      HYPERSEGMENTATION      LARGE PLATELETS Present     PLATELET CLUMPS      WBC MORPHOLOGY COMMENT      RBC MORPHOLOGY COMMENT      PLATELET MORPHOLOGY COMMENT Normal     BANDS NEUTROPHILS MANUAL 7     BAND ABSOLUTE      NEUTROPHILS MANUAL 73     LYMPHOCYTES MANUAL 13     MONOCYTES MANUAL 4     EOSINOPHILS MANUAL      BASOPHILS MANUAL      PROMYELOCYTES MANUAL      MYELOCYTES MANUAL 3     METAMYELOCYTES MANUAL      BLASTS MANUAL      TOTAL CELLS COUNTED [#] IN BLOOD 100     OTHER CELLS MANUAL      NUCLEATED RBC MANUAL      PLASMA CELL %      PLASMA CELL ABSOLUE      PLASMA CELLS MANUAL      HYPOCHROMASIA Slight    POC BLOOD GLUCOSE (RESULTS)   Result Value Ref Range    GLUCOSE, POC 269 50 - 500 mg/dl       Diagnostic Imaging:  CT EXTREMITY LOWER RIGHT WO IV CONTRAST   Final Result   1.THERE IS SOFT TISSUE GAS PRESENT AT THE LEVEL OF THE DISTAL LOWER LEG AND ANKLE. ALTHOUGH THIS COULD BE DUE TO THE HISTORY OF A PENETRATING WOUND, AND INFECTION WITH A GAS-FORMING ORGANISM CANNOT BE EXCLUDED. NO DEEP DRAINABLE ABSCESS IS IDENTIFIED. NO OSTEOMYELITIS IS IDENTIFIED..         One or more dose reduction techniques were used (e.g., Automated exposure control, adjustment of the mA and/or kV according to patient size, use of iterative reconstruction technique).   STEPPED ON POP CAN, LARGE OPEN SORE TO RIGHT ANKLE, AOI WRAPPED D/T  WEEPING      TECHNIQUE: CT without intravenous contrast.        COMPARISON: None.   # of known CTs in the past 12 months: 0    # of known Cardiac Nuclear Medicine Studies in the past 12 months: 0      FINDINGS:  Bones: No evidence of fracture.      Joints: Joint space(s) preserved.  No subluxation or dislocation.      Soft Tissues: Unremarkable.         IMPRESSION:            One or more dose reduction techniques were used (e.g., Automated exposure control, adjustment of the mA and/or kV according to patient size, use of iterative reconstruction technique).         Radiologist location ID: [id number]         CT ABDOMEN PELVIS WO IV CONTRAST   Final Result   1.THERE IS SOME ILIAC CHAIN LYMPHADENOPATHY   2.SPLENOMEGALY   3.NONOBSTRUCTING STONES IN THE TRANSPLANTED KIDNEY   4.FATTY LIVER   5.SMALL HYPERDENSE LESION MID POLE RIGHT NATIVE KIDNEY COULD BE EVALUATED ELECTIVELY WITH A FOLLOW-UP ULTRASOUND          One or more dose reduction techniques were used (e.g., Automated exposure control, adjustment of the mA and/or kV according to patient size, use of iterative reconstruction technique).         Radiologist location ID: [id number]         XR CHEST AP   Final Result   NO ACUTE FINDINGS.         Radiologist location ID: [id number]              Assesment/Plan:  Patient Active Problem List   Diagnosis   . Hypomagnesemia   . Testicular hypofunction   . Type 2 diabetes mellitus without complications (CMS HCC)   . CKD (chronic kidney disease) stage 3, GFR 30-59 ml/min (CMS HCC)   . Anemia, unspecified   . Mixed hyperlipidemia   . Atrial fibrillation (CMS HCC)   . Diabetic peripheral neuropathy (CMS HCC)   . GERD (gastroesophageal reflux disease)   . Renal transplant recipient   . Impotence   . Hypogonadism in male   . Obstructive sleep apnea syndrome   . Candida infection   . Cellulitis        Sepsis present on admission   Right foot cellulitis  Continue with vancomycin and cefepime  CT scan evidence of  gas  Duplex negative for DVT  Blood cultures negative  Wound culture growing Gram-negative rods and Gram-positive cocci  Taken for surgical debridement 5/6    Acute kidney injury   Suspected to be prerenal  Urine sediment benign  Renal function improved    Abnormal lesion on kidney seen on CT scan  Will need follow-up ultrasound on outpatient basis    Hyponatremia   Secondary to renal failure and hyperglycemia, continue to monitor closely    Anemia   Normocytic  Continue to monitor closely, consider further workup outpatient    DVT Prophylaxis:  Subcutaneous heparin  Diet:  Diabetic  Physical Therapy:  Consulted  Disposition:  Home versus rehab    On the day of the encounter, a total of  45 minutes was spent on this patient encounter including review of historical information, examination, documentation and post-visit activities. The time documented excludes procedural time.    Celene Kras, DO

## 2021-09-08 NOTE — Care Plan (Signed)
Problem: Adult Inpatient Plan of Care  Goal: Plan of Care Review  Outcome: Ongoing (see interventions/notes)  Goal: Patient-Specific Goal (Individualized)  Outcome: Ongoing (see interventions/notes)  Goal: Absence of Hospital-Acquired Illness or Injury  Outcome: Ongoing (see interventions/notes)  Intervention: Prevent and Manage VTE (Venous Thromboembolism) Risk  Recent Flowsheet Documentation  Taken 09/08/2021 0800 by Verne Spurr, RN  VTE Prevention/Management: anticoagulant therapy maintained  Goal: Optimal Comfort and Wellbeing  Outcome: Ongoing (see interventions/notes)  Goal: Rounds/Family Conference  Outcome: Ongoing (see interventions/notes)     Problem: Fall Injury Risk  Goal: Absence of Fall and Fall-Related Injury  Outcome: Ongoing (see interventions/notes)     Problem: Pain Acute  Goal: Optimal Pain Control and Function  Outcome: Ongoing (see interventions/notes)     Problem: Infection  Goal: Absence of Infection Signs and Symptoms  Outcome: Ongoing (see interventions/notes)     Problem: Skin or Soft Tissue Infection  Goal: Absence of Infection Signs and Symptoms  Outcome: Ongoing (see interventions/notes)     Problem: UTI (Urinary Tract Infection)  Goal: Improved Infection Symptoms  Outcome: Ongoing (see interventions/notes)

## 2021-09-08 NOTE — Care Plan (Signed)
[facility]  Care Plan Note    Situation: Patient admitted with cellulitis.     Intervention: Accu-checks, ABX, IV fluids.     Response: Patient becoming confused.       Reola Calkins, RN    Problem: Adult Inpatient Plan of Care  Goal: Patient-Specific Goal (Individualized)  Outcome: Ongoing (see interventions/notes)  Flowsheets (Taken 09/08/2021 0307)  Individualized Care Needs: wound care, O2.  Anxieties, Fears or Concerns: medication reconcilation  Patient-Specific Goals (Include Timeframe): go home soon

## 2021-09-08 NOTE — Nurses Notes (Signed)
Patient's confusion increasing. Patient was unable to state where he was at. Charge nurse notified. IV access initiated.

## 2021-09-09 DIAGNOSIS — Z940 Kidney transplant status: Secondary | ICD-10-CM

## 2021-09-09 DIAGNOSIS — L0390 Cellulitis, unspecified: Secondary | ICD-10-CM

## 2021-09-09 LAB — MANUAL DIFFERENTIAL
BAND %: 3 % — ABNORMAL LOW (ref 5–11)
BANDS NEUTROPHILS MANUAL: 3
LYMPHOCYTE %: 17 % — ABNORMAL LOW (ref 25–45)
LYMPHOCYTE ABSOLUTE: 1.9 10*3/uL (ref 1.10–5.00)
LYMPHOCYTES MANUAL: 17
MONOCYTE %: 2 % (ref 0–12)
MONOCYTE ABSOLUTE: 0.22 10*3/uL (ref 0.00–1.30)
MONOCYTES MANUAL: 2
NEUTROPHIL %: 78 % — ABNORMAL HIGH (ref 40–76)
NEUTROPHIL ABSOLUTE: 9.07 10*3/uL — ABNORMAL HIGH (ref 1.80–8.40)
NEUTROPHILS MANUAL: 78
TOTAL CELLS COUNTED [#] IN BLOOD: 100
WBC: 11.2 10*3/uL

## 2021-09-09 LAB — MAGNESIUM: MAGNESIUM: 2.2 mg/dL (ref 1.9–2.7)

## 2021-09-09 LAB — COMPREHENSIVE METABOLIC PANEL, NON-FASTING
ALBUMIN/GLOBULIN RATIO: 0.9 (ref 0.8–1.4)
ALBUMIN: 3.1 g/dL — ABNORMAL LOW (ref 3.5–5.7)
ALKALINE PHOSPHATASE: 89 U/L (ref 34–104)
ALT (SGPT): 32 U/L (ref 7–52)
ANION GAP: 7 mmol/L — ABNORMAL LOW (ref 10–20)
AST (SGOT): 14 U/L (ref 13–39)
BILIRUBIN TOTAL: 0.4 mg/dL (ref 0.3–1.2)
BUN/CREA RATIO: 43 — ABNORMAL HIGH (ref 6–22)
BUN: 73 mg/dL — ABNORMAL HIGH (ref 7–25)
CALCIUM, CORRECTED: 10 mg/dL (ref 8.9–10.8)
CALCIUM: 9.1 mg/dL (ref 8.6–10.3)
CHLORIDE: 102 mmol/L (ref 98–107)
CO2 TOTAL: 27 mmol/L (ref 21–31)
CREATININE: 1.7 mg/dL — ABNORMAL HIGH (ref 0.60–1.30)
ESTIMATED GFR: 46 mL/min/{1.73_m2} — ABNORMAL LOW (ref >59)
GLOBULIN: 3.3 (ref 2.9–5.4)
GLUCOSE: 266 mg/dL — ABNORMAL HIGH (ref 74–109)
OSMOLALITY, CALCULATED: 303 mOsm/kg — ABNORMAL HIGH (ref 270–290)
POTASSIUM: 5.3 mmol/L — ABNORMAL HIGH (ref 3.5–5.1)
PROTEIN TOTAL: 6.4 g/dL (ref 6.4–8.9)
SODIUM: 136 mmol/L (ref 136–145)

## 2021-09-09 LAB — POC BLOOD GLUCOSE (RESULTS)
GLUCOSE, POC: 220 mg/dl (ref 50–500)
GLUCOSE, POC: 148 mg/dl (ref 50–500)
GLUCOSE, POC: 203 mg/dl (ref 50–500)

## 2021-09-09 LAB — CBC WITH DIFF
HCT: 30.7 % — ABNORMAL LOW (ref 42.0–51.0)
HGB: 9.9 g/dL — ABNORMAL LOW (ref 13.5–18.0)
MCH: 30.3 pg (ref 27.0–32.0)
MCHC: 32.4 g/dL (ref 32.0–36.0)
MCV: 93.6 fL (ref 78.0–99.0)
MPV: 10.4 fL (ref 7.4–10.4)
PLATELET COMMENT: NORMAL
PLATELETS: 348 10*3/uL (ref 140–440)
RBC: 3.28 10*6/uL — ABNORMAL LOW (ref 4.20–6.00)
RDW: 14.1 % (ref 11.6–14.8)
WBC: 11.2 10*3/uL — ABNORMAL HIGH (ref 4.0–10.5)
WBCS UNCORRECTED: 11.2 10*3/uL

## 2021-09-09 LAB — LAVENDER TOP TUBE

## 2021-09-09 LAB — VANCOMYCIN, TROUGH: VANCOMYCIN TROUGH: 11.7 ug/mL — ABNORMAL HIGH (ref 5.0–10.0)

## 2021-09-09 MED ORDER — FUROSEMIDE 10 MG/ML INJECTION SOLUTION
20.0000 mg | Freq: Once | INTRAMUSCULAR | Status: AC
Start: 2021-09-09 — End: 2021-09-09
  Administered 2021-09-09: 20 mg via INTRAVENOUS
  Filled 2021-09-09: qty 4

## 2021-09-09 MED ORDER — PREDNISONE 20 MG TABLET
20.0000 mg | ORAL_TABLET | Freq: Every morning | ORAL | Status: DC
Start: 2021-09-09 — End: 2021-09-10
  Administered 2021-09-09 – 2021-09-10 (×2): 20 mg via ORAL
  Filled 2021-09-09 (×2): qty 1

## 2021-09-09 NOTE — Care Plan (Signed)
[place] Plan Note    Situation:Patient admitted with cellulitis.     Intervention: Wound care post debridement, ABX.    Response: Patient cooperative with care.       Lahoma Rocker, RN    Problem: Adult Inpatient Plan of Care  Goal: Patient-Specific Goal (Individualized)  Outcome: Ongoing (see interventions/notes)  Flowsheets (Taken 09/09/2021 0252)  Individualized Care Needs: wound care, ABX.  Anxieties, Fears or Concerns: wound dressing  Patient-Specific Goals (Include Timeframe): go home soon

## 2021-09-09 NOTE — Care Plan (Signed)
Patient alert and oriented x 4. Patient lungs diminished and respirations easy non labored on room air. Patient abdomen obese, soft non-tender, and BS active. Patient encouraged to reposition frequently to prevent skin breakdown. Patient up in chair for all meals today with assistance x 2. Patient voiding with out difficulty via urinal. Dressing to right heel changed per orders. Patient to be NPO tonight 09/10/2021 starting at midnight for possible additional debridement to heel. One time dose IV lasix administered. IV antibiotics continued. Bed in lowest position and call light within reach. Patient able to make needs known.    Problem: Skin or Soft Tissue Infection  Goal: Absence of Infection Signs and Symptoms  Outcome: Ongoing (see interventions/notes)

## 2021-09-09 NOTE — Consults (Signed)
Martin Porter 55 y.o. male 311/A   Date of Service: 09/09/2021    Date of Admission:  09/04/2021   PCP: Delaney Meigs, PA-C Code Status:Full Code       Reason for Consultation:  Acute renal failure, kidney transplant status  HPI:   56 year old gentleman with past medical history of kidney transplant for many years presenting to the hospital with pain and swelling associated with redness of the right lower extremity that started about 2 days ago.  Patient denies nausea or vomiting today but reports decrease in his oral intake.  Patient reports to me that usually have good kidney numbers his kidney transplant since 2015 did not have any major issues.  Patient denies missing his anti-rejection medications.  Takes only Prograf and Myfortic on regular basis.  He is not on steroids.  Lab work indicated creatinine of 4.4 BUN 59 potassium 4.2 carbon dioxide 24 calcium 9.2 magnesium 1.9.  Nephrology service was consulted for acute renal failure.    Today:  Patient is status post I&D on the right foot pod#2.  Patient feeling well.  Patient wants to go home.  Patient reports mild edema.  No chest pain.  No shortness of breath.  No diarrhea.  No fevers or chills       Patient has good follow up outpatient with his nephrologist in the area.        ROS:   Systematic review of 12 organ systems was negative except what mentioned in in the HPI.      Current medications   acetaminophen (TYLENOL) tablet, 650 mg, Oral, Q4H PRN  atropine (ISOPTO ATROPINE) 1% ophthalmic solution, 1 Drop, Both Eyes, 4x/day  cefepime (MAXIPIME) 2 g in NS 50 mL IVPB minibag, 2 g, Intravenous, Q12H  cholecalciferol (VITAMIN D3) 1000 unit (25 mcg) tablet, 1,000 Units, Oral, Daily  dextrose 50% (0.5 g/mL) injection - syringe, 25 g, Intravenous, Q15 Min PRN   Or  dextrose 50% (0.5 g/mL) injection - syringe, 12.5 g, Intravenous, Q15 Min PRN   Or  glucagon (GLUCAGEN DIAGNOSTIC KIT)  injection 1 mg, 1 mg, Subcutaneous, Q15 Min PRN   Or  glucagon (GLUCAGEN DIAGNOSTIC KIT) injection 1 mg, 1 mg, IntraMUSCULAR, Q15 Min PRN  dorzolamide (TRUSOPT) 2 % ophthalmic solution, 1 Drop, Right Eye, 2x/day  famotidine (PEPCID) tablet, 40 mg, Oral, Daily  furosemide (LASIX) 10 mg/mL injection, 20 mg, Intravenous, Once  gabapentin (NEURONTIN) capsule, 200 mg, Oral, 3x/day  [Held by provider] heparin 5,000 unit/mL injection, 5,000 Units, Subcutaneous, Q12H  HYDROcodone-acetaminophen (NORCO) 5-325 mg per tablet, 1 Tablet, Oral, Q4H PRN  insulin glargine 100 units/mL injection, 35 Units, Subcutaneous, NIGHTLY  insulin glargine 100 units/mL injection, 45 Units, Subcutaneous, Daily with Breakfast  insulin lispro 100 units/mL injection, 25 Units, Subcutaneous, 2x/day AC  ipratropium-albuterol 0.5 mg-3 mg(2.5 mg base)/3 mL Solution for Nebulization, 3 mL, Nebulization, Q4H PRN  linezolid (ZYVOX) tablet, 600 mg, Oral, 2x/day  magnesium oxide (MAG-OX) 473m (2452melemental magnesium) tablet, 400 mg, Oral, 2x/day  [Held by provider] mycophenolate (CELLCEPT) 250 mg capsule, 250 mg, Oral, 2X/day  [Held by provider] mycophenolate (MYFORTIC) delayed release tablet, 540 mg, Oral, 2X/day  nystatin (NYSTOP) 100,000 units/g topical powder, , Apply Topically, 2x/day  ondansetron (ZOFRAN) 2 mg/mL injection, 4 mg, Intravenous, Q6H PRN  pancreatic enzyme replacement (CREON) 12,000 units lipase per cap, 4 Capsule, Oral, 2x/day  pantoprazole (PROTONIX) delayed release tablet, 40 mg,  Oral, Daily  pravastatin (PRAVACHOL) tablet, 40 mg, Oral, QPM  prednisoLONE acetate (PRED-FORTE) 1% ophthalmic suspension, 1 Drop, Both Eyes, 2x/day  predniSONE (DELTASONE) tablet, 20 mg, Oral, Daily with Breakfast  SSIP insulin R human (HUMULIN R) 100 units/mL injection, 0-12 Units, Subcutaneous, 4x/day PRN  tacrolimus (PROGRAF) capsule, 2 mg, Oral, 2X/day  timolol (TIMOPTIC) 0.5% ophthalmic solution, 1 Drop, Right Eye, 2x/day  [Held by provider]  trimethoprim-sulfamethoxazole (BACTRIM) 40-21m per 5 mL oral liquid, 80 mg, Oral, Q24H            Physical:  Filed Vitals:    09/09/21 0343 09/09/21 0802 09/09/21 1109 09/09/21 1202   BP: (!) 184/56 (!) 173/86 (!) 163/83    Pulse:  75 77 70   Resp:  20 19    Temp:  36.7 C (98.1 F)     SpO2: 91% 96% 92%         Intake/Output Summary (Last 24 hours) at 09/09/2021 1456  Last data filed at 09/09/2021 1346  Gross per 24 hour   Intake 940 ml   Output 1925 ml   Net -985 ml        Patient is awake.  NAD  HEENT normocephalic atraumatic.  Eye exam normal inspection.    Mucous membranes moist, no jaundice.  Neck exam no JVD normal inspection.  Cardiovascular system: Regular rate and rhythm no murmurs rubs or gallops. No chest wall tenderness  Lungs: Clear to auscultation bilaterally no wheezing no rhonchi.  Abdomen soft nontender nondistended.  Right lower quadrant allograft nontender.  Obese  Extremities :  Right lower extremity +trace edema and tenderness to palpation.  Right foot/ankle wound noted    Neuro exam: EOMI, normal speech    Labs:  CBC:     11.2*, 11.2 (05/08 0108) \   9.9* (05/08 0108) /   348 (05/08 0108)      / 30.7* (05/08 0108) \          BMP:   136 (05/08 0108) 102 (05/08 0108) 73* (05/08 0108)    /     266* (05/08 0108)   5.3* (05/08 0108) 27 (05/08 0108) 1.70* (05/08 0108) \                 Diagnostic studies:  Imaging:   CT EXTREMITY LOWER RIGHT WO IV CONTRAST  Narrative: Martin Porter    RADIOLOGIST: GBerton Bon MD    CT EXTREMITY LOWER RIGHT WO IV CONTRAST performed on 09/06/2021 1:01 PM    CLINICAL HISTORY: Cellulitis vs nec fasciitis. RADIOLOGIST: GBerton Bon MD    CT EXTREMITY LOWER RIGHT WO IV CONTRAST performed on 09/06/2021 1:01 PM    CLINICAL HISTORY: Cellulitis vs nec fasciitis.  STEPPED ON POP CAN, LARGE OPEN SORE TO RIGHT ANKLE, AOI WRAPPED D/T WEEPING    TECHNIQUE:  Right lower leg/ankle CT without intravenous contrast.      COMPARISON: None.  # of known CTs in the past 12 months: 1   # of  known Cardiac Nuclear Medicine Studies in the past 12 months: 0    FINDINGS:  Bones: No evidence of fracture.    Joints: Joint space(s) preserved.  No subluxation or dislocation.    Soft Tissues: There is soft tissue gas within the subcutaneous tissues of the distal lower leg and ankle both posteriorly and laterally there is associated soft tissue edema within the subcutaneous fat. No deep drainable fluid collections are identified  Impression: 1.THERE IS SOFT TISSUE GAS PRESENT AT  THE LEVEL OF THE DISTAL LOWER LEG AND ANKLE. ALTHOUGH THIS COULD BE DUE TO THE HISTORY OF A PENETRATING WOUND, AND INFECTION WITH A GAS-FORMING ORGANISM CANNOT BE EXCLUDED. NO DEEP DRAINABLE ABSCESS IS IDENTIFIED. NO OSTEOMYELITIS IS IDENTIFIED..    One or more dose reduction techniques were used (e.g., Automated exposure control, adjustment of the mA and/or kV according to patient size, use of iterative reconstruction technique).  STEPPED ON POP CAN, LARGE OPEN SORE TO RIGHT ANKLE, AOI WRAPPED D/T WEEPING    TECHNIQUE: CT without intravenous contrast.      COMPARISON: None.  # of known CTs in the past 12 months: 0   # of known Cardiac Nuclear Medicine Studies in the past 12 months: 0    FINDINGS:  Bones: No evidence of fracture.    Joints: Joint space(s) preserved.  No subluxation or dislocation.    Soft Tissues: Unremarkable.    IMPRESSION:    One or more dose reduction techniques were used (e.g., Automated exposure control, adjustment of the mA and/or kV according to patient size, use of iterative reconstruction technique).    Radiologist location ID: [id number]      Assessments:  Active Hospital Problems   (*Primary Problem)    Diagnosis   . *Cellulitis   . CKD (chronic kidney disease) stage 3, GFR 30-59 ml/min (CMS HCC)   . Renal transplant recipient       Plan:     Acute renal failure on chronic kidney disease stage 3  -baseline creatinine is unavailable  -creatinine on admission 4.4  -creatinine today 4.69-->1.7 condition  improved  -Lasix 20 IV 1 dose  -acute renal failure is prerenal in etiology  -patient will need outpatient follow-up on his kidney transplant.      Kidney transplant status  -resume Prograf 2 mg b.i.d.   -resume CellCept//Myfortic 540 b.i.d.  -no need for further steroids    Edema and cellulitis  -Zyvox x 1  week  -Augmentin x 1 week  -Status post I and D        Patient is okay for discharge from Nephrology standpoint.  See recommendations above  Patient to follow up with his nephrologist within a week after discharge.  Recommend wound care/home health          Beather Arbour, MD, FASN, 09/09/2021, 14:56

## 2021-09-09 NOTE — Progress Notes (Signed)
[facility]  General Surgery  Follow-Up Consultation    Date of Service:  09/09/2021  Amber, Guthridge, 56 y.o. male  Date of Admission:  09/04/2021  Date of Birth:  07-Dec-1965  PCP: Delaney Meigs, PA-C    HPI: Post op debridement right posterior heel ankle.  Denies any complaints.      acetaminophen (TYLENOL) tablet, 650 mg, Oral, Q4H PRN  atropine (ISOPTO ATROPINE) 1% ophthalmic solution, 1 Drop, Both Eyes, 4x/day  cefepime (MAXIPIME) 2 g in NS 50 mL IVPB minibag, 2 g, Intravenous, Q12H  cholecalciferol (VITAMIN D3) 1000 unit (25 mcg) tablet, 1,000 Units, Oral, Daily  dextrose 50% (0.5 g/mL) injection - syringe, 25 g, Intravenous, Q15 Min PRN   Or  dextrose 50% (0.5 g/mL) injection - syringe, 12.5 g, Intravenous, Q15 Min PRN   Or  glucagon (GLUCAGEN DIAGNOSTIC KIT) injection 1 mg, 1 mg, Subcutaneous, Q15 Min PRN   Or  glucagon (GLUCAGEN DIAGNOSTIC KIT) injection 1 mg, 1 mg, IntraMUSCULAR, Q15 Min PRN  dorzolamide (TRUSOPT) 2 % ophthalmic solution, 1 Drop, Right Eye, 2x/day  famotidine (PEPCID) tablet, 40 mg, Oral, Daily  gabapentin (NEURONTIN) capsule, 200 mg, Oral, 3x/day  [Held by provider] heparin 5,000 unit/mL injection, 5,000 Units, Subcutaneous, Q12H  HYDROcodone-acetaminophen (NORCO) 5-325 mg per tablet, 1 Tablet, Oral, Q4H PRN  insulin glargine 100 units/mL injection, 35 Units, Subcutaneous, NIGHTLY  insulin glargine 100 units/mL injection, 45 Units, Subcutaneous, Daily with Breakfast  insulin lispro 100 units/mL injection, 25 Units, Subcutaneous, 2x/day AC  ipratropium-albuterol 0.5 mg-3 mg(2.5 mg base)/3 mL Solution for Nebulization, 3 mL, Nebulization, Q4H PRN  linezolid (ZYVOX) tablet, 600 mg, Oral, 2x/day  magnesium oxide (MAG-OX) 461m (2456melemental magnesium) tablet, 400 mg, Oral, 2x/day  [Held by provider] mycophenolate (CELLCEPT) 250 mg capsule, 250 mg, Oral, 2X/day  [Held by provider] mycophenolate (MYFORTIC) delayed release tablet, 540 mg, Oral, 2X/day  nystatin (NYSTOP)  100,000 units/g topical powder, , Apply Topically, 2x/day  ondansetron (ZOFRAN) 2 mg/mL injection, 4 mg, Intravenous, Q6H PRN  pancreatic enzyme replacement (CREON) 12,000 units lipase per cap, 4 Capsule, Oral, 2x/day  pantoprazole (PROTONIX) delayed release tablet, 40 mg, Oral, Daily  pravastatin (PRAVACHOL) tablet, 40 mg, Oral, QPM  prednisoLONE acetate (PRED-FORTE) 1% ophthalmic suspension, 1 Drop, Both Eyes, 2x/day  predniSONE (DELTASONE) tablet, 20 mg, Oral, Daily with Breakfast  SSIP insulin R human (HUMULIN R) 100 units/mL injection, 0-12 Units, Subcutaneous, 4x/day PRN  tacrolimus (PROGRAF) capsule, 2 mg, Oral, 2X/day  timolol (TIMOPTIC) 0.5% ophthalmic solution, 1 Drop, Right Eye, 2x/day  [Held by provider] trimethoprim-sulfamethoxazole (BACTRIM) 40-20074mer 5 mL oral liquid, 80 mg, Oral, Q24H         No Known Allergies          Filed Vitals:    09/09/21 0334 09/09/21 0343 09/09/21 0802 09/09/21 1109   BP: 102/67 (!) 184/56 (!) 173/86 (!) 163/83   Pulse: 66  75 77   Resp: '18  20 19   '$ Temp: 36.4 C (97.6 F)  36.7 C (98.1 F)    SpO2: 96% 91% 96% 92%       Physical Exam  Constitutional:       General: He is awake.      Appearance: Normal appearance. He is obese.   Cardiovascular:      Rate and Rhythm: Normal rate and regular rhythm.      Heart sounds: Normal heart sounds.   Pulmonary:      Effort: Pulmonary effort is normal.  Breath sounds: Normal breath sounds. Decreased air movement present.   Feet:      Left foot:      Skin integrity: Ulcer present.      Comments: Right heel ulcer, moderate brown drainage, foul odor  Neurological:      Mental Status: He is alert.   Psychiatric:         Behavior: Behavior is cooperative.              Laboratory Data:     Results for orders placed or performed during the hospital encounter of 09/04/21 (from the past 24 hour(s))   VANCOMYCIN, TROUGH   Result Value Ref Range    VANCOMYCIN TROUGH 11.7 (H) 5.0 - 10.0 ug/mL   COMPREHENSIVE METABOLIC PANEL, NON-FASTING    Result Value Ref Range    SODIUM 136 136 - 145 mmol/L    POTASSIUM 5.3 (H) 3.5 - 5.1 mmol/L    CHLORIDE 102 98 - 107 mmol/L    CO2 TOTAL 27 21 - 31 mmol/L    ANION GAP 7 (L) 10 - 20 mmol/L    BUN 73 (H) 7 - 25 mg/dL    CREATININE 1.70 (H) 0.60 - 1.30 mg/dL    BUN/CREA RATIO 43 (H) 6 - 22    ESTIMATED GFR 46 (L) >59 mL/min/1.38m2    ALBUMIN 3.1 (L) 3.5 - 5.7 g/dL    CALCIUM 9.1 8.6 - 10.3 mg/dL    GLUCOSE 266 (H) 74 - 109 mg/dL    ALKALINE PHOSPHATASE 89 34 - 104 U/L    ALT (SGPT) 32 7 - 52 U/L    AST (SGOT) 14 13 - 39 U/L    BILIRUBIN TOTAL 0.4 0.3 - 1.2 mg/dL    PROTEIN TOTAL 6.4 6.4 - 8.9 g/dL    ALBUMIN/GLOBULIN RATIO 0.9 0.8 - 1.4    OSMOLALITY, CALCULATED 303 (H) 270 - 290 mOsm/kg    CALCIUM, CORRECTED 10.0 8.9 - 10.8 mg/dL    GLOBULIN 3.3 2.9 - 5.4   MAGNESIUM   Result Value Ref Range    MAGNESIUM 2.2 1.9 - 2.7 mg/dL   CBC WITH DIFF   Result Value Ref Range    WBCS UNCORRECTED 11.2 x10^3/uL    WBC 11.2 (H) 4.0 - 10.5 x10^3/uL    RBC 3.28 (L) 4.20 - 6.00 x10^6/uL    HGB 9.9 (L) 13.5 - 18.0 g/dL    HCT 30.7 (L) 42.0 - 51.0 %    MCV 93.6 78.0 - 99.0 fL    MCH 30.3 27.0 - 32.0 pg    MCHC 32.4 32.0 - 36.0 g/dL    RDW 14.1 11.6 - 14.8 %    PLATELETS 348 140 - 440 x10^3/uL    MPV 10.4 7.4 - 10.4 fL    RBC COMMENT Stomato 1+     PLATELET COMMENT Platelet count appears normal on smear    MANUAL DIFFERENTIAL   Result Value Ref Range    WBC 11.2 x10^3/uL    NEUTROPHIL % 78 (H) 40 - 76 %    LYMPHOCYTE % 17 (L) 25 - 45 %    MONOCYTE % 2 0 - 12 %    EOSINOPHIL %      BASOPHIL %      METAMYELOCYTE %      MYELOCYTE %      PROMYELOCYTE %      BAND % 3 (L) 5 - 11 %    BLAST %      OTHER %  NEUTROPHIL ABSOLUTE 9.07 (H) 1.80 - 8.40 x10^3/uL    LYMPHOCYTE ABSOLUTE 1.90 1.10 - 5.00 x10^3/uL    MONOCYTE ABSOLUTE 0.22 0.00 - 1.30 x10^3/uL    EOSINOPHIL ABSOLUTE      BASOPHIL ABSOLUTE      METAMYELOCYTE ABSOLUTE      MYELOCYTE ABSOLUTE      PROMYELOCYTE ABSOLUTE      BLAST ABSOLUTE      OTHER CELL ABSOLUTE      ANISOCYTOSIS       POLYCHROMASIA      POIKILOCYTOSIS      BASOPHILIC STIPPLING      MICROCYTOSIS      MACROCYTOSIS      ROULEAUX      SCHISTOCYTES      SPHEROCYTES      TARGET CELLS      TEARDROP CELLS      OVALOCYTE (ELLIPTOCYTE)      CRENATED RED CELLS      STOMATOCYTES      ACANTHOCYTES (SPUR CELL)      ECHINOCYTE (BURR CELL)      BLISTER CELLS      RBC AGGLUTINATES      HOWELL JOLLY BODIES      ATYPICAL LYMPHOCYTES      TOXIC GRANULATION      DOHLE BODIES      TOXIC VACUOLIZATION      AUER RODS      BASKET CELLS      HYPERSEGMENTATION      LARGE PLATELETS      PLATELET CLUMPS      WBC MORPHOLOGY COMMENT      RBC MORPHOLOGY COMMENT      PLATELET MORPHOLOGY COMMENT      BANDS NEUTROPHILS MANUAL 3     BAND ABSOLUTE      NEUTROPHILS MANUAL 78     LYMPHOCYTES MANUAL 17     MONOCYTES MANUAL 2     EOSINOPHILS MANUAL      BASOPHILS MANUAL      PROMYELOCYTES MANUAL      MYELOCYTES MANUAL      METAMYELOCYTES MANUAL      BLASTS MANUAL      TOTAL CELLS COUNTED [#] IN BLOOD 100     OTHER CELLS MANUAL      NUCLEATED RBC MANUAL      PLASMA CELL %      PLASMA CELL ABSOLUE      PLASMA CELLS MANUAL      HYPOCHROMASIA     LAVENDER TOP TUBE   Result Value Ref Range    RAINBOW/EXTRA TUBE AUTO RESULT Yes    POC BLOOD GLUCOSE (RESULTS)   Result Value Ref Range    GLUCOSE, POC 220 50 - 500 mg/dl       No results found.       Assessment/Plan:  Right heel decubitus ulcer  Keep NPO after midnight for possible additional debridement in the am.  Will re-evaluate wound in the am.      The advanced practice clinician's documentation was reviewed/amended in its entirety with the assessment and plan portion completely performed independently by me during this separate encounter.  Bridgett Larsson MD MBA CPE FACS     Sandi Raveling, FNP        This note was partially created using voice recognition software and is inherently subject to errors including those of syntax and "sound alike " substitutions which may escape proof reading. In such instances, original  meaning may be extrapolated  by contextual derivation.

## 2021-09-09 NOTE — Progress Notes (Signed)
[place]    HOSPITALIST PROGRESS NOTE    Meridee Score  Date of service: 09/09/2021  Date of Admission:  09/04/2021  Hospital Day:  LOS: 4 days     Subjective:   Patient seen and examined.  No new complaints.  He is for possible additional debridement in a.m..      Vital Signs:  Filed Vitals:    09/09/21 0802 09/09/21 1109 09/09/21 1202 09/09/21 1626   BP: (!) 173/86 (!) 163/83  (!) 174/85   Pulse: 75 77 70 74   Resp: '20 19  18   ' Temp: 36.7 C (98.1 F)   36.6 C (97.8 F)   SpO2: 96% 92%  90%        Physical Exam:  General:  Patient in NAD, resting in bed, no visitors present  Head:  Normocephalic, atraumatic  Eyes:  PERRL, anicteric sclera  ENT:  Oral mucosa moist, no nasal discharge   Neck:  Soft, supple, trachea midline  Heart:  RRR, S1 and S2 normal  Lungs:  Unlabored respirations.  Lungs are clear to auscultation bilaterally, with no wheezes, no rales, no conversational dyspnea  Abdomen:  Soft, active bowel sounds, non-tender to palpation,   Psych:  Cooperative, not agitated    Intake & Output:    Intake/Output Summary (Last 24 hours) at 09/09/2021 1934  Last data filed at 09/09/2021 1636  Gross per 24 hour   Intake 800 ml   Output 4125 ml   Net -3325 ml     I/O current shift:  No intake/output data recorded.  Emesis:    BM:    Date of Last Bowel Movement: 09/08/21  Heme:      acetaminophen (TYLENOL) tablet, 650 mg, Oral, Q4H PRN  atropine (ISOPTO ATROPINE) 1% ophthalmic solution, 1 Drop, Both Eyes, 4x/day  cefepime (MAXIPIME) 2 g in NS 50 mL IVPB minibag, 2 g, Intravenous, Q12H  cholecalciferol (VITAMIN D3) 1000 unit (25 mcg) tablet, 1,000 Units, Oral, Daily  dextrose 50% (0.5 g/mL) injection - syringe, 25 g, Intravenous, Q15 Min PRN   Or  dextrose 50% (0.5 g/mL) injection - syringe, 12.5 g, Intravenous, Q15 Min PRN   Or  glucagon (GLUCAGEN DIAGNOSTIC KIT) injection 1 mg, 1 mg, Subcutaneous, Q15 Min PRN   Or  glucagon (GLUCAGEN DIAGNOSTIC KIT) injection 1 mg, 1 mg,  IntraMUSCULAR, Q15 Min PRN  dorzolamide (TRUSOPT) 2 % ophthalmic solution, 1 Drop, Right Eye, 2x/day  famotidine (PEPCID) tablet, 40 mg, Oral, Daily  gabapentin (NEURONTIN) capsule, 200 mg, Oral, 3x/day  [Held by provider] heparin 5,000 unit/mL injection, 5,000 Units, Subcutaneous, Q12H  HYDROcodone-acetaminophen (NORCO) 5-325 mg per tablet, 1 Tablet, Oral, Q4H PRN  insulin glargine 100 units/mL injection, 35 Units, Subcutaneous, NIGHTLY  insulin glargine 100 units/mL injection, 45 Units, Subcutaneous, Daily with Breakfast  insulin lispro 100 units/mL injection, 25 Units, Subcutaneous, 2x/day AC  ipratropium-albuterol 0.5 mg-3 mg(2.5 mg base)/3 mL Solution for Nebulization, 3 mL, Nebulization, Q4H PRN  linezolid (ZYVOX) tablet, 600 mg, Oral, 2x/day  magnesium oxide (MAG-OX) 469m (2497melemental magnesium) tablet, 400 mg, Oral, 2x/day  [Held by provider] mycophenolate (CELLCEPT) 250 mg capsule, 250 mg, Oral, 2X/day  [Held by provider] mycophenolate (MYFORTIC) delayed release tablet, 540 mg, Oral, 2X/day  nystatin (NYSTOP) 100,000 units/g topical powder, , Apply Topically, 2x/day  ondansetron (ZOFRAN) 2 mg/mL injection, 4 mg, Intravenous, Q6H PRN  pancreatic enzyme replacement (CREON) 12,000 units lipase per cap, 4 Capsule, Oral, 2x/day  pantoprazole (PROTONIX) delayed release tablet,  40 mg, Oral, Daily  pravastatin (PRAVACHOL) tablet, 40 mg, Oral, QPM  prednisoLONE acetate (PRED-FORTE) 1% ophthalmic suspension, 1 Drop, Both Eyes, 2x/day  predniSONE (DELTASONE) tablet, 20 mg, Oral, Daily with Breakfast  SSIP insulin R human (HUMULIN R) 100 units/mL injection, 0-12 Units, Subcutaneous, 4x/day PRN  tacrolimus (PROGRAF) capsule, 2 mg, Oral, 2X/day  timolol (TIMOPTIC) 0.5% ophthalmic solution, 1 Drop, Right Eye, 2x/day  [Held by provider] trimethoprim-sulfamethoxazole (BACTRIM) 40-289m per 5 mL oral liquid, 80 mg, Oral, Q24H          Labs:  Recent Results (from the past 48 hour(s))   MANUAL DIFFERENTIAL    Collection  Time: 09/09/21  1:08 AM   Result Value    WBC 11.2   CBC WITH DIFF    Collection Time: 09/09/21  1:08 AM   Result Value    WBC 11.2 (H)    HGB 9.9 (L)    HCT 30.7 (L)    PLATELETS 348      No results found for this or any previous visit (from the past 48 hour(s)).   Recent Results (from the past 48 hour(s))   COMPREHENSIVE METABOLIC PANEL, NON-FASTING    Collection Time: 09/09/21  1:08 AM   Result Value    ALKALINE PHOSPHATASE 89    ALT (SGPT) 32    AST (SGOT) 14   COMPREHENSIVE METABOLIC PANEL, NON-FASTING    Collection Time: 09/08/21  7:38 AM   Result Value    ALKALINE PHOSPHATASE 93    ALT (SGPT) 31    AST (SGOT) 13      No results found for this or any previous visit (from the past 48 hour(s)).   No results found for this or any previous visit (from the past 48 hour(s)).   No results found for this or any previous visit (from the past 1344 hour(s)).   No results found for this or any previous visit (from the past 48 hour(s)).     Microbiology:  Hospital Encounter on 09/04/21 (from the past 96 hour(s))   [facility]    Collection Time: 09/07/21 10:09 AM    Specimen: Ankle; Other   Culture Result Status    GRAM STAIN 1+ Rare PMNs Preliminary    GRAM STAIN 3+ Several Gram Negative Rod Preliminary    GRAM STAIN 1+ Rare Gram Positive Cocci Preliminary    Narrative    CONTINUING TO HOLD         Imaging:   CT EXTREMITY LOWER RIGHT WO IV CONTRAST  Narrative: Juda L Gosch    RADIOLOGIST: GBerton Bon MD    CT EXTREMITY LOWER RIGHT WO IV CONTRAST performed on 09/06/2021 1:01 PM    CLINICAL HISTORY: Cellulitis vs nec fasciitis. RADIOLOGIST: GBerton Bon MD    CT EXTREMITY LOWER RIGHT WO IV CONTRAST performed on 09/06/2021 1:01 PM    CLINICAL HISTORY: Cellulitis vs nec fasciitis.  STEPPED ON POP CAN, LARGE OPEN SORE TO RIGHT ANKLE, AOI WRAPPED D/T WEEPING    TECHNIQUE:  Right lower leg/ankle CT without intravenous contrast.      COMPARISON: None.  # of known CTs in the past 12 months: 1   # of  known Cardiac Nuclear Medicine Studies in the past 12 months: 0    FINDINGS:  Bones: No evidence of fracture.    Joints: Joint space(s) preserved.  No subluxation or dislocation.    Soft Tissues: There is soft tissue gas within the subcutaneous tissues of the  distal lower leg and ankle both posteriorly and laterally there is associated soft tissue edema within the subcutaneous fat. No deep drainable fluid collections are identified  Impression: 1.THERE IS SOFT TISSUE GAS PRESENT AT THE LEVEL OF THE DISTAL LOWER LEG AND ANKLE. ALTHOUGH THIS COULD BE DUE TO THE HISTORY OF A PENETRATING WOUND, AND INFECTION WITH A GAS-FORMING ORGANISM CANNOT BE EXCLUDED. NO DEEP DRAINABLE ABSCESS IS IDENTIFIED. NO OSTEOMYELITIS IS IDENTIFIED..    One or more dose reduction techniques were used (e.g., Automated exposure control, adjustment of the mA and/or kV according to patient size, use of iterative reconstruction technique).  STEPPED ON POP CAN, LARGE OPEN SORE TO RIGHT ANKLE, AOI WRAPPED D/T WEEPING    TECHNIQUE: CT without intravenous contrast.      COMPARISON: None.  # of known CTs in the past 12 months: 0   # of known Cardiac Nuclear Medicine Studies in the past 12 months: 0    FINDINGS:  Bones: No evidence of fracture.    Joints: Joint space(s) preserved.  No subluxation or dislocation.    Soft Tissues: Unremarkable.    IMPRESSION:    One or more dose reduction techniques were used (e.g., Automated exposure control, adjustment of the mA and/or kV according to patient size, use of iterative reconstruction technique).    Radiologist location ID: [id number]        Assessment/ Plan:   Active Hospital Problems   (*Primary Problem)    Diagnosis   . *Cellulitis   . CKD (chronic kidney disease) stage 3, GFR 30-59 ml/min (CMS HCC)   . Renal transplant recipient     Creatinine overall is much better.  Consultant input is appreciated.  Based on surgery assessment for additional debridement possible discharge in next 1-2 days on oral  antibiotics.  Condition and plan discussed with patient and all questions answered.    See orders.        Disposition Planning:  Home    Dionne Milo, MD  09/09/2021  [place] HOSPITALIST

## 2021-09-10 LAB — BASIC METABOLIC PANEL
ANION GAP: 7 mmol/L — ABNORMAL LOW (ref 10–20)
BUN/CREA RATIO: 41 — ABNORMAL HIGH (ref 6–22)
BUN: 56 mg/dL — ABNORMAL HIGH (ref 7–25)
CALCIUM: 9.4 mg/dL (ref 8.6–10.3)
CHLORIDE: 105 mmol/L (ref 98–107)
CO2 TOTAL: 29 mmol/L (ref 21–31)
CREATININE: 1.35 mg/dL — ABNORMAL HIGH (ref 0.60–1.30)
ESTIMATED GFR: 61 mL/min/{1.73_m2} (ref >59)
GLUCOSE: 167 mg/dL — ABNORMAL HIGH (ref 74–109)
OSMOLALITY, CALCULATED: 301 mOsm/kg — ABNORMAL HIGH (ref 270–290)
POTASSIUM: 4.1 mmol/L (ref 3.5–5.1)
SODIUM: 141 mmol/L (ref 136–145)

## 2021-09-10 LAB — WOUND, SUPERFICIAL/NON-STERILE SITE, AEROBIC CULTURE AND GRAM STAIN

## 2021-09-10 LAB — ADULT ROUTINE BLOOD CULTURE, SET OF 2 BOTTLES (BACTERIA AND YEAST)
BLOOD CULTURE, ROUTINE: NO GROWTH
BLOOD CULTURE, ROUTINE: NO GROWTH

## 2021-09-10 LAB — POC BLOOD GLUCOSE (RESULTS)
GLUCOSE, POC: 170 mg/dl (ref 50–500)
GLUCOSE, POC: 59 mg/dl (ref 50–500)

## 2021-09-10 MED ORDER — LINEZOLID 600 MG TABLET
600.0000 mg | ORAL_TABLET | Freq: Two times a day (BID) | ORAL | 0 refills | Status: AC
Start: 2021-09-10 — End: 2021-09-17

## 2021-09-10 MED ORDER — INSULIN GLARGINE 100 UNITS/ML SUBQ - CHARGE BY DOSE
45.0000 [IU] | Freq: Every morning | SUBCUTANEOUS | 0 refills | Status: DC
Start: 2021-09-11 — End: 2022-03-31

## 2021-09-10 MED ORDER — INSULIN GLARGINE 100 UNITS/ML SUBQ - CHARGE BY DOSE
35.0000 [IU] | Freq: Every evening | SUBCUTANEOUS | Status: DC
Start: 2021-09-10 — End: 2021-09-10

## 2021-09-10 MED ORDER — HYDRALAZINE 20 MG/ML INJECTION SOLUTION
5.0000 mg | Freq: Once | INTRAMUSCULAR | Status: AC
Start: 2021-09-10 — End: 2021-09-10
  Administered 2021-09-10: 5 mg via INTRAVENOUS
  Filled 2021-09-10: qty 1

## 2021-09-10 MED ORDER — MYCOPHENOLATE MOFETIL 250 MG CAPSULE
500.0000 mg | ORAL_CAPSULE | Freq: Two times a day (BID) | ORAL | 0 refills | Status: AC
Start: 2021-09-10 — End: 2021-09-14

## 2021-09-10 MED ORDER — AMOXICILLIN 875 MG-POTASSIUM CLAVULANATE 125 MG TABLET
1.0000 | ORAL_TABLET | Freq: Two times a day (BID) | ORAL | 0 refills | Status: DC
Start: 2021-09-10 — End: 2021-09-19

## 2021-09-10 MED ORDER — INSULIN GLARGINE 100 UNITS/ML SUBQ - CHARGE BY DOSE
35.0000 [IU] | Freq: Every evening | SUBCUTANEOUS | 0 refills | Status: DC
Start: 2021-09-10 — End: 2022-07-17

## 2021-09-10 NOTE — Care Plan (Signed)
[facility]  Care Plan Note    Situation: Adm with cellulitis of the right foot    Intervention: IV ABX, PO steroids. NPO since midnight for 2nd debridement of right foot.      Response: Labs and VS monitored.       Dallas Breeding, RN

## 2021-09-10 NOTE — Nurses Notes (Signed)
After eating lunch and snack, POC glucose 135.  Patient continues to deny c/o.

## 2021-09-10 NOTE — Consults (Signed)
Martin Porter 56 y.o. male 311/A   Date of Service: 09/10/2021    Date of Admission:  09/04/2021   PCP: Martin Meigs, PA-C Code Status:Full Code       Reason for Consultation:  Acute renal failure, kidney transplant status  HPI:   56 year old gentleman with past medical history of kidney transplant for many years presenting to the hospital with pain and swelling associated with redness of the right lower extremity that started about 2 days ago.  Patient denies nausea or vomiting today but reports decrease in his oral intake.  Patient reports to me that usually have good kidney numbers his kidney transplant since 2015 did not have any major issues.  Patient denies missing his anti-rejection medications.  Takes only Prograf and Myfortic on regular basis.  He is not on steroids.  Lab work indicated creatinine of 4.4 BUN 59 potassium 4.2 carbon dioxide 24 calcium 9.2 magnesium 1.9.  Nephrology service was consulted for acute renal failure.    Today:  Patient is feeling better wants to go home.  Denies nausea or vomiting.  No chest pain or shortness of breath.        ROS:   Systematic review of 12 organ systems was negative except what mentioned in in the HPI.      Current medications   acetaminophen (TYLENOL) tablet, 650 mg, Oral, Q4H PRN  atropine (ISOPTO ATROPINE) 1% ophthalmic solution, 1 Drop, Both Eyes, 4x/day  cefepime (MAXIPIME) 2 g in NS 50 mL IVPB minibag, 2 g, Intravenous, Q12H  cholecalciferol (VITAMIN D3) 1000 unit (25 mcg) tablet, 1,000 Units, Oral, Daily  dextrose 50% (0.5 g/mL) injection - syringe, 25 g, Intravenous, Q15 Min PRN   Or  dextrose 50% (0.5 g/mL) injection - syringe, 12.5 g, Intravenous, Q15 Min PRN   Or  glucagon (GLUCAGEN DIAGNOSTIC KIT) injection 1 mg, 1 mg, Subcutaneous, Q15 Min PRN   Or  glucagon (GLUCAGEN DIAGNOSTIC KIT) injection 1 mg, 1 mg, IntraMUSCULAR, Q15 Min PRN  dorzolamide (TRUSOPT) 2 % ophthalmic  solution, 1 Drop, Right Eye, 2x/day  gabapentin (NEURONTIN) capsule, 200 mg, Oral, 3x/day  [Held by provider] heparin 5,000 unit/mL injection, 5,000 Units, Subcutaneous, Q12H  HYDROcodone-acetaminophen (NORCO) 5-325 mg per tablet, 1 Tablet, Oral, Q4H PRN  insulin glargine 100 units/mL injection, 45 Units, Subcutaneous, Daily with Breakfast  insulin glargine 100 units/mL injection, 35 Units, Subcutaneous, NIGHTLY  insulin lispro 100 units/mL injection, 25 Units, Subcutaneous, 2x/day AC  ipratropium-albuterol 0.5 mg-3 mg(2.5 mg base)/3 mL Solution for Nebulization, 3 mL, Nebulization, Q4H PRN  linezolid (ZYVOX) tablet, 600 mg, Oral, 2x/day  magnesium oxide (MAG-OX) 419m (2452melemental magnesium) tablet, 400 mg, Oral, 2x/day  mycophenolate (MYFORTIC) delayed release tablet, 540 mg, Oral, 2X/day  ondansetron (ZOFRAN) 2 mg/mL injection, 4 mg, Intravenous, Q6H PRN  pantoprazole (PROTONIX) delayed release tablet, 40 mg, Oral, Daily  pravastatin (PRAVACHOL) tablet, 40 mg, Oral, QPM  prednisoLONE acetate (PRED-FORTE) 1% ophthalmic suspension, 1 Drop, Both Eyes, 2x/day  SSIP insulin R human (HUMULIN R) 100 units/mL injection, 0-12 Units, Subcutaneous, 4x/day PRN  tacrolimus (PROGRAF) capsule, 2 mg, Oral, 2X/day  timolol (TIMOPTIC) 0.5% ophthalmic solution, 1 Drop, Right Eye, 2x/day            Physical:  Filed Vitals:    09/10/21 0008 09/10/21 0102 09/10/21 0744 09/10/21 1119   BP:  (!) 173/85 (!) 179/73    Pulse:   67 100  Resp: 18  18    Temp:   36.6 C (97.9 F)    SpO2:   92%         Intake/Output Summary (Last 24 hours) at 09/10/2021 1923  Last data filed at 09/10/2021 1020  Gross per 24 hour   Intake 240 ml   Output 2050 ml   Net -1810 ml        Patient is awake.  NAD  HEENT normocephalic atraumatic.  Eye exam normal inspection.    Mucous membranes moist, no jaundice.  Neck exam no JVD normal inspection.  Cardiovascular system: Regular rate and rhythm no murmurs rubs or gallops. No chest wall tenderness  Lungs: Clear to  auscultation bilaterally no wheezing no rhonchi.  Abdomen soft nontender nondistended.  Right lower quadrant allograft nontender.  Obese  Extremities :  Right lower extremity +trace edema and tenderness to palpation.  Right foot/ankle wound noted    Neuro exam: EOMI, normal speech    Labs:  CBC:       \     /          /   \          BMP:   141 (05/09 0441) 105 (05/09 0441) 56* (05/09 0441)    /     167* (05/09 0441)   4.1 (05/09 0441) 29 (05/09 0441) 1.35* (05/09 0441) \                 Diagnostic studies:  Imaging:   CT EXTREMITY LOWER RIGHT WO IV CONTRAST  Narrative: Martin Porter    RADIOLOGIST: Berton Bon, MD    CT EXTREMITY LOWER RIGHT WO IV CONTRAST performed on 09/06/2021 1:01 PM    CLINICAL HISTORY: Cellulitis vs nec fasciitis. RADIOLOGIST: Berton Bon, MD    CT EXTREMITY LOWER RIGHT WO IV CONTRAST performed on 09/06/2021 1:01 PM    CLINICAL HISTORY: Cellulitis vs nec fasciitis.  STEPPED ON POP CAN, LARGE OPEN SORE TO RIGHT ANKLE, AOI WRAPPED D/T WEEPING    TECHNIQUE:  Right lower leg/ankle CT without intravenous contrast.      COMPARISON: None.  # of known CTs in the past 12 months: 1   # of known Cardiac Nuclear Medicine Studies in the past 12 months: 0    FINDINGS:  Bones: No evidence of fracture.    Joints: Joint space(s) preserved.  No subluxation or dislocation.    Soft Tissues: There is soft tissue gas within the subcutaneous tissues of the distal lower leg and ankle both posteriorly and laterally there is associated soft tissue edema within the subcutaneous fat. No deep drainable fluid collections are identified  Impression: 1.THERE IS SOFT TISSUE GAS PRESENT AT THE LEVEL OF THE DISTAL LOWER LEG AND ANKLE. ALTHOUGH THIS COULD BE DUE TO THE HISTORY OF A PENETRATING WOUND, AND INFECTION WITH A GAS-FORMING ORGANISM CANNOT BE EXCLUDED. NO DEEP DRAINABLE ABSCESS IS IDENTIFIED. NO OSTEOMYELITIS IS IDENTIFIED..    One or more dose reduction techniques were used (e.g., Automated exposure control, adjustment of  the mA and/or kV according to patient size, use of iterative reconstruction technique).  STEPPED ON POP CAN, LARGE OPEN SORE TO RIGHT ANKLE, AOI WRAPPED D/T WEEPING    TECHNIQUE: CT without intravenous contrast.      COMPARISON: None.  # of known CTs in the past 12 months: 0   # of known Cardiac Nuclear Medicine Studies in the past 12 months: 0    FINDINGS:  Bones:  No evidence of fracture.    Joints: Joint space(s) preserved.  No subluxation or dislocation.    Soft Tissues: Unremarkable.    IMPRESSION:    One or more dose reduction techniques were used (e.g., Automated exposure control, adjustment of the mA and/or kV according to patient size, use of iterative reconstruction technique).    Radiologist location ID: [id number]      Assessments:  Active Hospital Problems   (*Primary Problem)    Diagnosis   . *Cellulitis   . CKD (chronic kidney disease) stage 3, GFR 30-59 ml/min (CMS HCC)   . Renal transplant recipient       Plan:     Acute renal failure on chronic kidney disease stage 3  -baseline creatinine is unavailable  -creatinine on admission 4.4  -creatinine today 4.69--> 1.3. .      Kidney transplant status  -resume Prograf 2 mg b.i.d.   -resume CellCept//Myfortic 540 b.i.d.  -no need for further steroids    Edema and cellulitis  -Zyvox x 1  week  -Augmentin x 1 week  -Status post I and D      Discharge planning and instructions  Patient is okay for discharge from Nephrology standpoint.  See recommendations above   Patient to follow up with his nephrologist within a week after discharge.  Recommend wound care/home health  Patient to follow up with surgery    I discussed the case with the hospitalist and the general surgeon.  Time spent to help with the discharge and discussing with the other specialist was more than 45 minutes          Beather Arbour, MD, FASN, 09/10/2021, 19:23

## 2021-09-10 NOTE — Nurses Notes (Signed)
Patient to family vehicle via wheelchair accompanied by nurse.  Denies c/o.  Declined to wait for his walker to be delivered, stating he will borrow one from a family member until his can be delivered.  All belongings with patient on discharge.

## 2021-09-10 NOTE — Nurses Notes (Signed)
Pt home medications brought to floor by security. Patient received all home medications and packed in personal belongings. Contacted [facility] to schedule patient followup appt within 1 week. They state they will contact the patient directly and schedule the appointment.

## 2021-09-10 NOTE — Discharge Summary (Signed)
[place]     DISCHARGE SUMMARY      PATIENT NAME:  Martin Porter   MRN:  [id number]  DOB:  1966/01/19    INPATIENT ADMISSION DATE: 09/04/2021   DATE OF DISCHARGE:  09/10/21     ATTENDING PHYSICIAN: Dionne Milo, MD     PRIMARY CARE PHYSICIAN: [place] PRESENTATION:    Please see full admission H&P for details.      As per HPI:    Patient was admitted to [facility] on May 4th when he presented to [facility] emergency room with erythema edema and wound of right heel.  He also had foul odor reported to the heel.  Patient was admitted for cellulitis and General surgery and Nephrology were consulted.  General surgery did debridement of right posterior heel ankle area.  Patient had element of acute renal failure and he is status post kidney transplant nephrology saw patient and followed his hospital course.  Patient progressively improved and was discharged home on oral antibiotics Zyvox and Augmentin today.  He is recommended to follow with surgery and Nephrology as outpatient.  Home health also has been set up.  Condition and plan was discussed with patient and all questions were answered.  He verbalized understanding and agreement with plan      FURTHER HOSPITAL COURSE WITH DISCHARGE DIAGNOSES:          Problem List:  Active Hospital Problems   (*Primary Problem)    Diagnosis   . *Cellulitis   . CKD (chronic kidney disease) stage 3, GFR 30-59 ml/min (CMS HCC)   . Renal transplant recipient               PHYSICAL EXAM   DAY OF DISCHARGE:    BP (!) 179/73   Pulse 100   Temp 36.6 C (97.9 F)   Resp 18   Ht 1.854 m (_0 )   Wt (!) 154 kg (340 lb 5 oz)   SpO2 92%   BMI 44.90 kg/m          General:  Patient is resting in bed, no acute distress, alert and oriented x3   Eyes:  PERRL, no scleral icterus   HENT:  Normocephalic, atraumatic, oral mucosa is moist and pink, no nasal discharge   Heart:  RRR, S1 and S2 auscultated, no murmurs  appreciated   Lungs:  Unlabored respirations.  Lungs are clear to auscultation bilaterally, no wheezes, no rales  Abdomen:  Soft, active bowel sounds,  Psych:  Cooperative, not agitated    LABS:  CBC with Diff (Last 48 Hours):    Recent Results in last 48 hours     09/09/21  0108   WBC 11.2*  11.2   HGB 9.9*   HCT 30.7*   MCV 93.6   PLTCNT 348   BANDS 3*   PMNS 78*   LYMPHOCYTES 17*   MONOCYTES 2          Last BMP  (Last result in the past 48 hours)      Na   K   Cl   CO2   BUN   Cr   Calcium   Glucose   Glucose-Fasting        09/10/21 0441 141   4.1   105   29   56   1.35   9.4   167  Last Hepatic Panel  (Last result in the past 48 hours)      Albumin   Total PTN   Total Bili   Direct Bili   Ast/SGOT   Alt/SGPT   Alk Phos        09/09/21 0108 3.1   6.4   0.4     14   32   89              BMP (Last 48 Hours):    Recent Results in last 48 hours     09/09/21  0108 09/10/21  0441   SODIUM 136 141   POTASSIUM 5.3* 4.1   CHLORIDE 102 105   CO2 27 29   BUN 73* 56*   CREATININE 1.70* 1.35*   CALCIUM 9.1 9.4   GLUCOSENF 266* 167*          IMAGING:                 DISCHARGE MEDICATIONS:     Current Discharge Medication List      START taking these medications.      Details   amoxicillin-pot clavulanate 875-125 mg Tablet  Commonly known as: AUGMENTIN   1 Tablet, Oral, 2 TIMES DAILY  Qty: 14 Tablet  Refills: 0     linezolid 600 mg Tablet  Commonly known as: ZYVOX   600 mg, Oral, 2 TIMES DAILY  Qty: 14 Tablet  Refills: 0     mycophenolate mofetil 250 mg Capsule  Commonly known as: CELLCEPT   500 mg, Oral, 2 TIMES DAILY  Qty: 16 Capsule  Refills: 0        CONTINUE these medications which have CHANGED during your visit.      Details   * insulin glargine 100 unit/mL injection  What changed: You were already taking a medication with the same name, and this prescription was added. Make sure you understand how and when to take each.  Replaces: insulin glargine 100 unit/mL Insulin Pen   35 Units, Subcutaneous, NIGHTLY  Qty:  30 Each  Refills: 0     * insulin glargine 100 unit/mL injection  Start taking on: Sep 11, 2021  What changed:    medication strength   how much to take   when to take this   Another medication with the same name was removed. Continue taking this medication, and follow the directions you see here.   45 Units, Subcutaneous, EVERY MORNING WITH BREAKFAST  Qty: 30 Each  Refills: 0         * This list has 2 medication(s) that are the same as other medications prescribed for you. Read the directions carefully, and ask your doctor or other care provider to review them with you.            CONTINUE these medications - NO CHANGES were made during your visit.      Details   amLODIPine 5 mg Tablet  Commonly known as: NORVASC   5 mg, Oral, DAILY  Refills: 0     atropine 1 % Drops  Commonly known as: ISOPTO ATROPINE   1 Drop, 4 TIMES DAILY  Refills: 0     carvediloL 25 mg Tablet  Commonly known as: COREG   25 mg, Oral, 2 TIMES DAILY WITH FOOD  Qty: 60 Tablet  Refills: 2     cholecalciferol (vitamin D3) 1,250 mcg (50,000 unit) Capsule   1,250 mcg, Oral, EVERY 7 DAYS  Refills: 0  Creon 12,000-38,000 -60,000 unit Capsule, Delayed Release(E.C.)  Generic drug: pancreatic enzyme replacement   TAKE FOUR CAPSULES BY MOUTH TWICE A DAY  Qty: 240 Capsule  Refills: 3     dorzolamide-timoloL 22.3-6.8 mg/mL Drops  Commonly known as: COSOPT   1 Drop, Left Eye, 2 TIMES DAILY  Refills: 0     famotidine 40 mg Tablet  Commonly known as: PEPCID   40 mg, Oral, 2 TIMES DAILY  Refills: 0     fenofibrate 160 mg Tablet  Commonly known as: LOFIBRA   160 mg, Oral, DAILY  Refills: 0     gabapentin 800 mg Tablet  Commonly known as: NEURONTIN   TAKE ONE TABLET BY MOUTH THREE TIMES A DAY  Qty: 90 Tablet  Refills: 3     hydroCHLOROthiazide 25 mg Tablet  Commonly known as: HYDRODIURIL   25 mg, Oral, DAILY  Refills: 0     insulin aspart U-100 100 unit/mL (3 mL) Insulin Pen  Commonly known as: NOVOLOG   25 Units, Subcutaneous, 2 TIMES DAILY BEFORE MEALS,  25 units every morning 15 units every evening  Refills: 0     iron ps complex-B12-folic acid 277-41-2 mg-mcg-mg Capsule  Commonly known as: FERREX FORTE   1 Capsule, Oral, DAILY  Refills: 0     magnesium oxide 400 mg Tablet  Commonly known as: MAG-OX   400 mg, Oral, DAILY  Refills: 0     mycophenolate sodium 180 mg Tablet, Delayed Release (E.C.)  Commonly known as: MYFORTIC   540 mg, Oral, 2 TIMES DAILY  Refills: 0     nystatin 100,000 unit/gram Cream  Commonly known as: MYCOSTATIN   Apply Topically, 2 TIMES DAILY  Refills: 0     ondansetron 4 mg Tablet  Commonly known as: ZOFRAN   4 mg, Oral, EVERY 8 HOURS PRN  Refills: 0     pantoprazole 40 mg Tablet, Delayed Release (E.C.)  Commonly known as: PROTONIX   40 mg, Oral, DAILY  Refills: 0     potassium citrate 10 mEq (1,080 mg) Tablet Sustained Release  Commonly known as: UROCIT-K   10 mEq, Oral, 2 TIMES DAILY WITH FOOD  Refills: 0     pravastatin 40 mg Tablet  Commonly known as: PRAVACHOL   40 mg, Oral, EVERY EVENING  Refills: 0     prednisoLONE acetate 1 % Drops, Suspension  Commonly known as: PRED FORTE   1 Drop, Both Eyes, 2 TIMES DAILY  Refills: 0     tacrolimus 1 mg Capsule  Commonly known as: PROGRAF   3 mg, Oral, 2 TIMES DAILY  Refills: 0     testosterone 20.25 mg/1.25 gram (1.62 %) Gel in Metered-dose Pump  Commonly known as: ANDROGEL   20.25 mg, Transdermal, DAILY  Refills: 0     TRUEplus Pen Needle 31 gauge x 3/16" Needle  Generic drug: Pen Needle (Disposable)   USE 5 TIMES A DAY  Qty: 300 Each  Refills: 3        STOP taking these medications.    atenoloL 50 mg Tablet  Commonly known as: TENORMIN     insulin glargine 100 unit/mL Insulin Pen  Replaced by: insulin glargine 100 unit/mL injection  You also have another medication with the same name that you need to continue taking as instructed.     Sildenafil 100 mg Tablet  Commonly known as: VIAGRA     sulfamethoxazole-trimethoprim 400-80 mg  Commonly known as: BACTRIM  DISCHARGE DISPOSITION:   Home    DISCHARGE INSTRUCTIONS:    Follow-up Information     Delaney Meigs, PA-C Follow up in 1 week(s).    Specialties: Librarian, academic, INTERNAL MEDICINE  Contact information:  [address] [address]  [phone]             Beather Arbour, MD Follow up in 1 week(s).    Specialties: NEPHROLOGY, HOSPITALIST  Contact information:  [address]  [address]  [place] [address]  [phone]             Shirlee More, MD Follow up in 1 week(s).    Specialties: GENERAL SURGERY, VASCULAR SURGERY  Contact information:  [address]  [place] [address]  [phone]                       No discharge procedures on file.   Follow-up Information     Delaney Meigs, PA-C Follow up in 1 week(s).    Specialties: Librarian, academic, INTERNAL MEDICINE  Contact information:  [address] [address]  [phone]             Beather Arbour, MD Follow up in 1 week(s).    Specialties: NEPHROLOGY, HOSPITALIST  Contact information:  [address]  [address]  [place] [address]  [phone]             Shirlee More, MD Follow up in 1 week(s).    Specialties: GENERAL SURGERY, VASCULAR SURGERY  Contact information:  [address]  [place] [address]  [phone]                                Copies sent to Care Team       Relationship Specialty Notifications Start End    Felix Ahmadi PCP - General PHYSICIAN ASSISTANT All results, Admissions 07/19/21     Phone: [phone] Fax: [phone]         [address] [address]          >30 minutes total were spent coordinating discharge day today      Dionne Milo, MD  [place] HOSPITALIST

## 2021-09-10 NOTE — Progress Notes (Signed)
[facility]  General Surgery  Follow-Up Consultation    Date of Service:  09/10/2021  Martin Porter, Martin Porter, 56 y.o. male  Date of Admission:  09/04/2021  Date of Birth:  16-Jul-1965  PCP: Delaney Meigs, PA-C    HPI:  Feels better.  No new complaints.  Is hoping to go home soon     Filed Vitals:    09/10/21 0008 09/10/21 0102 09/10/21 0744 09/10/21 1119   BP:  (!) 173/85 (!) 179/73    Pulse:   67 100   Resp: 18  18    Temp:   36.6 C (97.9 F)    SpO2:   92%        General: appropriate for age. in no acute distress.    Vital signs are present above and have been reviewed by me     Extremity: Posterior heel with no new devitalized tissue.  No expressible drainage.  Induration seems to be less than yesterday.  There is some sloughing of the superficial skin surrounding the debridement site but otherwise no new changes and actually improved    Laboratory Data:     Results for orders placed or performed during the hospital encounter of 09/04/21 (from the past 24 hour(s))   BASIC METABOLIC PANEL   Result Value Ref Range    SODIUM 141 136 - 145 mmol/L    POTASSIUM 4.1 3.5 - 5.1 mmol/L    CHLORIDE 105 98 - 107 mmol/L    CO2 TOTAL 29 21 - 31 mmol/L    ANION GAP 7 (L) 10 - 20 mmol/L    CALCIUM 9.4 8.6 - 10.3 mg/dL    GLUCOSE 167 (H) 74 - 109 mg/dL    BUN 56 (H) 7 - 25 mg/dL    CREATININE 1.35 (H) 0.60 - 1.30 mg/dL    BUN/CREA RATIO 41 (H) 6 - 22    ESTIMATED GFR 61 >59 mL/min/1.45m^2    OSMOLALITY, CALCULATED 301 (H) 270 - 290 mOsm/kg   POC BLOOD GLUCOSE (RESULTS)   Result Value Ref Range    GLUCOSE, POC 170 50 - 500 mg/dl   POC BLOOD GLUCOSE (RESULTS)   Result Value Ref Range    GLUCOSE, POC 59 50 - 500 mg/dl           Assessment/Plan:  Cellulitis     No findings on today's exam that requires any other intervention at this time.  Do not feel that he would benefit from any other surgery.  Needs to continue antibiotics and local wound care.  That can probably be transitioned to outpatient.  I am willing to  follow him as an outpatient.  Discussed with Dr.Eter as well    Bridgett Larsson MD MBA CPE FACS     The advanced practice clinician's documentation was reviewed/amended in its entirety with the assessment and plan portion completely performed independently by me during this separate encounter    This note was partially created using voice recognition software and is inherently subject to errors including those of syntax and "sound alike " substitutions which may escape proof reading. In such instances, original meaning may be extrapolated by contextual derivation.

## 2021-09-10 NOTE — Nurses Notes (Signed)
Patient's foot dressing changed as ordered using Mepilex AG and Mepilex foam dressing prior to discharge.  Home health is consulted for assistance with dressing changes at home.

## 2021-09-11 ENCOUNTER — Ambulatory Visit (RURAL_HEALTH_CENTER): Payer: Self-pay | Admitting: Internal Medicine

## 2021-09-11 ENCOUNTER — Other Ambulatory Visit (HOSPITAL_COMMUNITY): Payer: Self-pay

## 2021-09-11 ENCOUNTER — Telehealth (HOSPITAL_COMMUNITY): Payer: Self-pay

## 2021-09-11 DIAGNOSIS — Z940 Kidney transplant status: Secondary | ICD-10-CM

## 2021-09-11 NOTE — Telephone Encounter (Signed)
-----   Message from Samuella Cota, PA-C sent at 09/11/2021 12:41 AM EDT -----  ----- Message from Vassie Moment sent at 09/10/2021  2:29 PM EDT -----  [facility] CALLED PATIENT NEEDS ONE WEEK HOSPITAL FOLLOW-UP, D/C 09/10/21 [facility]

## 2021-09-12 ENCOUNTER — Encounter (RURAL_HEALTH_CENTER): Payer: Self-pay | Admitting: Internal Medicine

## 2021-09-12 ENCOUNTER — Telehealth (INDEPENDENT_AMBULATORY_CARE_PROVIDER_SITE_OTHER): Payer: Self-pay | Admitting: Surgery

## 2021-09-12 ENCOUNTER — Telehealth (RURAL_HEALTH_CENTER): Payer: Self-pay | Admitting: Internal Medicine

## 2021-09-12 MED ORDER — PROMETHAZINE-DM 6.25 MG-15 MG/5 ML ORAL SYRUP
5.0000 mL | ORAL_SOLUTION | Freq: Four times a day (QID) | ORAL | 1 refills | Status: DC | PRN
Start: 2021-09-12 — End: 2022-07-17

## 2021-09-12 NOTE — Telephone Encounter (Signed)
[facility] called in regards to the discharge wound care orders stating to change the mepilex dressing daily, they are not able to come daily due to insurance denying that, they would like to know if you can change the order to 3 times a week dressing change, is this okay? Margit Hanks, LPN  7/85/8850, 11:02

## 2021-09-12 NOTE — Care Management Notes (Signed)
Discharge Recommendations/ Plan:Discharge ZS:WFUX with Home Health (code 6)  Pt is alert and oriented x3. He reports that he is legally blind. He receives transportation from his family(uncle),friends,and city bus. Pt has chosen [facility] for wound care and physical therapy. Referral has been sent. Walker w/wheels has been ordered from [employer]. Pt and his uncle chose to go home and have DME delivered to his home. Pt is borrowing a walker from another family member.        Resources: (copy and paste info here)

## 2021-09-12 NOTE — Care Management Notes (Signed)
Patient called CM back on Wednesday 09/11/2021. He reports that his insurance will not cover cost of zyvox without prior authorization. CM called patient's pharmacy of choice,[facility] and spoke with pharmacist. He gave CM number for Medicare Part D provider([employer]) [phone] and patient's TM#[id number]. "[place]" requested information r/t patient and advised that she would fax coverage determination form to CM for physician to complete/sign. [employer] did not fax document on 09/11/21. CM called back to company on 09/12/21 and spoke with "Teasia". She advised that records indicated that faxe failed. Cm requested information be refaxed. Physician signed document and CM returned it to [employer]. Patient's antibiotic was approved on 09/12/21. CM spoke with Bjorn Loser at [facility] and patient's PCP office and patient. Medication will be picked up by patient family member.

## 2021-09-12 NOTE — Telephone Encounter (Signed)
[phone] Rani(nurse from [facility] home health) nurse to call once order received.   Domingo Mend, LPN  [id number], [phone]

## 2021-09-13 NOTE — Telephone Encounter (Signed)
Vicente Males, nurse from home health, notified. Voiced understanding.   Domingo Mend, LPN  [id number], [phone]

## 2021-09-15 LAB — ANAEROBIC CULTURE: ANAEROBIC CULTURE: NEGATIVE — AB

## 2021-09-16 ENCOUNTER — Ambulatory Visit (RURAL_HEALTH_CENTER): Payer: Self-pay | Admitting: Internal Medicine

## 2021-09-18 ENCOUNTER — Other Ambulatory Visit (RURAL_HEALTH_CENTER): Payer: Self-pay | Admitting: Internal Medicine

## 2021-09-18 ENCOUNTER — Other Ambulatory Visit (INDEPENDENT_AMBULATORY_CARE_PROVIDER_SITE_OTHER): Payer: Self-pay | Admitting: Surgery

## 2021-09-18 ENCOUNTER — Other Ambulatory Visit: Payer: Self-pay

## 2021-09-18 ENCOUNTER — Inpatient Hospital Stay (INDEPENDENT_AMBULATORY_CARE_PROVIDER_SITE_OTHER)
Admission: RE | Admit: 2021-09-18 | Discharge: 2021-09-18 | Disposition: A | Discharge status: Home / Self Care | Payer: Medicare Other | Source: Ambulatory Visit

## 2021-09-18 ENCOUNTER — Ambulatory Visit (INDEPENDENT_AMBULATORY_CARE_PROVIDER_SITE_OTHER): Payer: Medicare Other | Admitting: Surgery

## 2021-09-18 ENCOUNTER — Encounter (INDEPENDENT_AMBULATORY_CARE_PROVIDER_SITE_OTHER): Payer: Self-pay | Admitting: Surgery

## 2021-09-18 VITALS — BP 135/77 | HR 81 | Temp 97.3°F | Ht 73.0 in | Wt 323.2 lb

## 2021-09-18 DIAGNOSIS — I739 Peripheral vascular disease, unspecified: Secondary | ICD-10-CM

## 2021-09-18 DIAGNOSIS — L97319 Non-pressure chronic ulcer of right ankle with unspecified severity: Secondary | ICD-10-CM

## 2021-09-18 LAB — STERILE SITE CULTURE AND GRAM STAIN, AEROBIC

## 2021-09-18 NOTE — Nursing Note (Signed)
Patient here for debridement of right foot abscess. 2 areas noted. Area to right ankle 3x2.5sc, Area noted to posterior ankle 10x 2.5cm. Wet to dry dressing applied. Patient schedule for appointment next week.   Meriam Sprague, LPN

## 2021-09-18 NOTE — Nursing Note (Addendum)
Attempted to give orders to [facility] home health for daily visit and to continue dressings until wound vac ordered. Office closed. Message left with answering service for nurse to return call.  Domingo Mend, LPN

## 2021-09-18 NOTE — Care Management Notes (Signed)
Referral Information  ++++++ Placed Provider #1 ++++++  Case Manager: Allie Dimmer  Provider Type: Home Health  Provider Name: [facility]  Address:  [address], [address]  [place], [place] [address]  Contact: Olean Ree    Phone: [phone] x  Fax:   Fax: [phone]

## 2021-09-19 ENCOUNTER — Ambulatory Visit: Payer: Medicare Other | Attending: Internal Medicine | Admitting: Internal Medicine

## 2021-09-19 ENCOUNTER — Ambulatory Visit (RURAL_HEALTH_CENTER): Payer: Medicare Other | Attending: Internal Medicine | Admitting: Internal Medicine

## 2021-09-19 ENCOUNTER — Encounter (INDEPENDENT_AMBULATORY_CARE_PROVIDER_SITE_OTHER): Payer: Self-pay | Admitting: Surgery

## 2021-09-19 ENCOUNTER — Encounter (RURAL_HEALTH_CENTER): Payer: Self-pay | Admitting: Internal Medicine

## 2021-09-19 VITALS — BP 144/84 | HR 75 | Temp 98.1°F | Resp 18 | Ht 73.0 in | Wt 321.0 lb

## 2021-09-19 DIAGNOSIS — E1142 Type 2 diabetes mellitus with diabetic polyneuropathy: Secondary | ICD-10-CM | POA: Insufficient documentation

## 2021-09-19 DIAGNOSIS — I480 Paroxysmal atrial fibrillation: Secondary | ICD-10-CM | POA: Insufficient documentation

## 2021-09-19 DIAGNOSIS — L97409 Non-pressure chronic ulcer of unspecified heel and midfoot with unspecified severity: Secondary | ICD-10-CM | POA: Insufficient documentation

## 2021-09-19 DIAGNOSIS — E782 Mixed hyperlipidemia: Secondary | ICD-10-CM | POA: Insufficient documentation

## 2021-09-19 DIAGNOSIS — Z940 Kidney transplant status: Secondary | ICD-10-CM | POA: Insufficient documentation

## 2021-09-19 DIAGNOSIS — E119 Type 2 diabetes mellitus without complications: Secondary | ICD-10-CM

## 2021-09-19 DIAGNOSIS — N1831 Chronic kidney disease, stage 3a (CMS HCC): Secondary | ICD-10-CM | POA: Insufficient documentation

## 2021-09-19 DIAGNOSIS — L89509 Pressure ulcer of unspecified ankle, unspecified stage: Secondary | ICD-10-CM | POA: Insufficient documentation

## 2021-09-19 DIAGNOSIS — E1122 Type 2 diabetes mellitus with diabetic chronic kidney disease: Secondary | ICD-10-CM | POA: Insufficient documentation

## 2021-09-19 DIAGNOSIS — L03115 Cellulitis of right lower limb: Secondary | ICD-10-CM

## 2021-09-19 MED ORDER — AMOXICILLIN 875 MG-POTASSIUM CLAVULANATE 125 MG TABLET
1.0000 | ORAL_TABLET | Freq: Two times a day (BID) | ORAL | 0 refills | Status: AC
Start: 2021-09-19 — End: 2021-09-26

## 2021-09-19 NOTE — Progress Notes (Signed)
INTERNAL MEDICINE, [place]  [address] [address]  Operated by [facility]  Transitional Care Management Note    Name: Martin Porter MRN:  [id number]   Date: 09/19/2021 Age: 56 y.o.     Chief Complaint: Hospital Follow Up (Infection in Right foot ) and Hospital Discharge Transition       SUBJECTIVE:  Martin Porter is a 56 y.o. male presenting today for follow-up after being discharged. The main problem requiring admission was cellulitis and s/p wound debridement on  Right foot  And leg      During hosp course    Patient was admitted to [facility] on May 4th when he presented to [facility] emergency room with erythema edema and wound of right heel.  He also had foul odor reported to the heel.  Patient was admitted for cellulitis and General surgery and Nephrology were consulted.  General surgery did debridement of right posterior heel ankle area.  Patient had element of acute renal failure and he is status post kidney transplant nephrology saw patient and followed his hospital course.  Patient progressively improved and was discharged home on oral antibiotics Zyvox and Augmentin today.  He is recommended to follow with surgery and Nephrology as outpatient.  Home health also has been set up.  Condition and plan was discussed with patient and all questions were answered.  He verbalized understanding and agreement with plan    Pt to fuwiht Dr Monia Pouch   Pt needs fu labs   Dr Venia Minks   Pt has seen surgeon and with the redness he says  He was to have Augmentin but not called in  I will send in the rx       OBJECTIVE:   BP (!) 144/84 (Site: Left, Patient Position: Sitting, Cuff Size: Adult)   Pulse 75   Temp 36.7 C (98.1 F) (Tympanic)   Resp 18   Ht 1.854 m (6\' 1" )   Wt (!) 146 kg (321 lb)   SpO2 96%   BMI 42.35 kg/m      Exam-AMB  Const  General: cooperative, healthy appearing and vision challenged and obese     Orientation/Consciousness: patient oriented x3  HENMT  Ears: hearing grossly normal bilaterally  Eyes  General: appearance normal, both eyes and all related structures  Conjunctivae: conjunctivae normal  Sclera: sclerae normal  EOM: EOM intact bilaterally  Neck  Neck: normal visual inspection and no lymphadenopathy  Thyroid: thyroid normal  Carotids: no bruits  Resp  Effort & Inspection: normal respiratory effort  Auscultation: clear to auscultation bilaterally, no crackles, no rales, no rhonchi and no wheezes  Cardio  Jugular venous pressure: no JVD  Rate: regular rate  Rhythm: regular rhythm  Heart Sounds: S1 normal, S2 normal, no click, no murmurs and no rubs  Bruits: no carotid bruits  GI  Inspection: Yes normal to inspection  Palpation: soft, no hepatosplenomegaly, no guarding, no masses and nontender  Auscultation: normal bowel sounds  Neuro  General: patient oriented x3  Extrem  Right ankle lateral and heel skin break down with  Warmth and erythema to ankle  Minimal tenderness to palpation  Some eschar  Pt following with surgeon    Area cleaned and silvadene  Topical and telfa padded dressing applied for now    Psych  Mental Status: mental status grossly normal  Mood: congruent mood  Affect: normal affect  Insight: insight good  Judgment: judgment good  Transition of Care Contact Information  DIscharge date: Discharge Date: Not Found  Transition Facility Type--Hospital (Inpatient or Observation)  [facility] Interactive Contact(s):  Completed Contact: 09/11/2021  1:43 PM  Contact Method(s)-- Patient/Caregiver Telephone  Clinical Staff Name/Role who York Pellant, RN TCM     Data Reviewed  Medication Reconciliation completed    Assessment and Plan    ICD-10-CM    1. Cellulitis of right lower extremity  L03.115 HGA1C (HEMOGLOBIN A1C WITH EST AVG GLUCOSE)     CBC/DIFF     COMPREHENSIVE METABOLIC PNL, FASTING     THYROID STIMULATING HORMONE (SENSITIVE TSH)     LIPID  PANEL      2. Mixed hyperlipidemia  E78.2 HGA1C (HEMOGLOBIN A1C WITH EST AVG GLUCOSE)     CBC/DIFF     COMPREHENSIVE METABOLIC PNL, FASTING     THYROID STIMULATING HORMONE (SENSITIVE TSH)     LIPID PANEL      3. Paroxysmal atrial fibrillation (CMS HCC)  I48.0 HGA1C (HEMOGLOBIN A1C WITH EST AVG GLUCOSE)     CBC/DIFF     COMPREHENSIVE METABOLIC PNL, FASTING     THYROID STIMULATING HORMONE (SENSITIVE TSH)     LIPID PANEL      4. Type 2 diabetes mellitus without complication, unspecified whether long term insulin use (CMS HCC)  E11.9 HGA1C (HEMOGLOBIN A1C WITH EST AVG GLUCOSE)     CBC/DIFF     COMPREHENSIVE METABOLIC PNL, FASTING     THYROID STIMULATING HORMONE (SENSITIVE TSH)     LIPID PANEL      5. Diabetic peripheral neuropathy (CMS HCC)  E11.42       6. Stage 3a chronic kidney disease (CMS HCC)  N18.31       7. Decubitus ulcer, ankle  L89.509       8. Ulcer of heel, unspecified laterality, unspecified ulcer stage (CMS HCC)  L97.409         Other transition actions (Optional) -: Discharge documentation was reviewed    4 week fu    Keep fu with Dr Monia Pouch and Dr  Venia Minks    Chart reviewed  [facility] records reviewed   Labs in fu   Continue home health      Delaney Meigs, PA-C

## 2021-09-20 ENCOUNTER — Telehealth (INDEPENDENT_AMBULATORY_CARE_PROVIDER_SITE_OTHER): Payer: Self-pay | Admitting: Surgery

## 2021-09-20 LAB — COMPREHENSIVE METABOLIC PNL, FASTING
ALBUMIN/GLOBULIN RATIO: 0.9 (ref 0.8–1.4)
ALBUMIN: 3.4 g/dL — ABNORMAL LOW (ref 3.5–5.7)
ALKALINE PHOSPHATASE: 47 U/L (ref 34–104)
ALT (SGPT): 16 U/L (ref 7–52)
ANION GAP: 8 mmol/L — ABNORMAL LOW (ref 10–20)
AST (SGOT): 19 U/L (ref 13–39)
BILIRUBIN TOTAL: 0.4 mg/dL (ref 0.3–1.2)
BUN/CREA RATIO: 14 (ref 6–22)
BUN: 28 mg/dL — ABNORMAL HIGH (ref 7–25)
CALCIUM, CORRECTED: 9.3 mg/dL (ref 8.9–10.8)
CALCIUM: 8.7 mg/dL (ref 8.6–10.3)
CHLORIDE: 101 mmol/L (ref 98–107)
CO2 TOTAL: 25 mmol/L (ref 21–31)
CREATININE: 2.07 mg/dL — ABNORMAL HIGH (ref 0.60–1.30)
ESTIMATED GFR: 37 mL/min/{1.73_m2} — ABNORMAL LOW (ref >59)
GLOBULIN: 3.8 (ref 2.9–5.4)
GLUCOSE: 117 mg/dL — ABNORMAL HIGH (ref 74–109)
OSMOLALITY, CALCULATED: 275 mOsm/kg (ref 270–290)
POTASSIUM: 4.4 mmol/L (ref 3.5–5.1)
PROTEIN TOTAL: 7.2 g/dL (ref 6.4–8.9)
SODIUM: 134 mmol/L — ABNORMAL LOW (ref 136–145)

## 2021-09-20 LAB — LIPID PANEL
CHOL/HDL RATIO: 4.1
CHOLESTEROL: 134 mg/dL (ref <200)
HDL CHOL: 33 mg/dL (ref 23–92)
LDL CALC: 68 mg/dL (ref 0–100)
TRIGLYCERIDES: 167 mg/dL — ABNORMAL HIGH (ref <=150)
VLDL CALC: 33 mg/dL (ref 0–50)

## 2021-09-20 LAB — CBC WITH DIFF
BASOPHIL #: 0 10*3/uL (ref 0.00–0.30)
BASOPHIL %: 1 % (ref 0–3)
EOSINOPHIL #: 0.1 10*3/uL (ref 0.00–0.80)
EOSINOPHIL %: 2 % (ref 0–7)
HCT: 27.5 % — ABNORMAL LOW (ref 42.0–51.0)
HGB: 9.3 g/dL — ABNORMAL LOW (ref 13.5–18.0)
LYMPHOCYTE #: 0.9 10*3/uL — ABNORMAL LOW (ref 1.10–5.00)
LYMPHOCYTE %: 19 % — ABNORMAL LOW (ref 25–45)
MCH: 31.7 pg (ref 27.0–32.0)
MCHC: 33.8 g/dL (ref 32.0–36.0)
MCV: 93.9 fL (ref 78.0–99.0)
MONOCYTE #: 0.5 10*3/uL (ref 0.00–1.30)
MONOCYTE %: 11 % (ref 0–12)
MPV: 10.4 fL (ref 7.4–10.4)
NEUTROPHIL #: 3.3 10*3/uL (ref 1.80–8.40)
NEUTROPHIL %: 68 % (ref 40–76)
PLATELETS: 199 10*3/uL (ref 140–440)
RBC: 2.93 10*6/uL — ABNORMAL LOW (ref 4.20–6.00)
RDW: 13.5 % (ref 11.6–14.8)
WBC: 4.9 10*3/uL (ref 4.0–10.5)
WBCS UNCORRECTED: 4.9 10*3/uL

## 2021-09-20 LAB — THYROID STIMULATING HORMONE (SENSITIVE TSH): TSH: 1.965 u[IU]/mL (ref 0.450–5.330)

## 2021-09-20 LAB — HGA1C (HEMOGLOBIN A1C WITH EST AVG GLUCOSE): HEMOGLOBIN A1C: 5.9 % (ref 4.0–6.0)

## 2021-09-20 NOTE — Telephone Encounter (Signed)
[facility] nurse given verbal order for daily visits per Dr. Sabino Gasser.   Meriam Sprague, LPN

## 2021-09-21 ENCOUNTER — Encounter (INDEPENDENT_AMBULATORY_CARE_PROVIDER_SITE_OTHER): Payer: Self-pay | Admitting: Surgery

## 2021-09-21 NOTE — Progress Notes (Signed)
[place] [facility]    Progress Note    Name: Martin Porter MRN:  [id number]   Date: 09/18/2021 Age: 56 y.o.          Date of Birth:  1966-04-21  PCP: Delaney Meigs, PA-C  Referring:  Delaney Meigs     HPI:  Martin Porter is a 56 y.o. White male who returns for evaluation of his right heel ulceration.   Has been taking oral Abx as prescribed since discharge from the hospital.  Home health is doing his dressing changes.      Have moderate amount of drainage.  No fever.  Minimal pain.  Still able to ambulate.         Past Medical History:   Diagnosis Date   . Abnormal prostate specific antigen    . Anemia, unspecified    . Atrial fibrillation (CMS HCC)    . Candida infection    . CKD (chronic kidney disease) stage 3, GFR 30-59 ml/min (CMS HCC)    . Cyst of kidney, acquired    . Diabetic peripheral neuropathy (CMS HCC)    . GERD (gastroesophageal reflux disease)    . Herpes simplex    . Hiatal hernia    . Hypogonadism in male    . Hypomagnesemia    . Impotence    . Legal blindness    . Mixed hyperlipidemia    . Obesity, unspecified     12/18 pt would like referral for bariatric surgery, would like to be seen by [facility] because of his hx of transplant   . Obstructive sleep apnea syndrome     4/18 compliant with CPAP, using nasal PILLOW MILD OSA. 12/17 MILD, WILL FOLLOW UP WITH SLEEP MED FOR CPAP MAY  BE BECAUSE FATIGUE   . Peptic ulcer     9/18 seen by gen surg/Reese, EGD and sono pending gastric ulcer, no neoplasm, H pylori negative 3/18 will need to decide on f/u EGD with Dr. Waynette Buttery   . Polyp of colon    . Renal transplant recipient    . Testicular hypofunction     5/18 followed by urology, they will follow PSA and testosterone levels prescribed gel 5/18 PSA 1.4 (slightly lower than last) followed by urology 5/18 PSA velocity increased form 2016-2017, testosterone held, was to have repeat PSA/VitD and T levels in our clinic and CC to Dr. Charlies Silvers Right  testicular hypotrophy   . Tobacco dependence syndrome    . Type 2 diabetes mellitus without complications (CMS HCC)    . Unspecified glaucoma(365.9)    . Vitamin D deficiency       Past Surgical History:   Procedure Laterality Date   . COLONOSCOPY     . EYE SURGERY     . FOOT SURGERY     . HX HERNIA REPAIR     . HX RENAL TRANSPLANT      1/20 pt has fu Feb 5th 12/19 Cr 1.69 GFR 45 6/19 Cr 1.41 GFR 56 3/19 Cr 1.67 GFR 46 11/18 seen by Dr. Marcy Panning, renal fx worsening, no comment on cause 11/18 Cr 1.65 GFR 478/18 Cr 1.95 GFR 38 6/18 Cr 1.5, Ca mildly high, SPE P/UPEPpending and asap f/u with nephrology 5/18 back to baseline cr 1.2 and GFR 68 CKD GFR 59 5/18 and was 59 11/17. Last Creat 1.36 increased from 1.2 11/17.   Marland Kitchen HX UPPER ENDOSCOPY     . KIDNEY SURGERY     .  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 It seems that pt underwent  pancreas tp to "cure" DM/islet cells  a few years ago pt seen by endo and given OHG had pancreatitis, thought due to the DM medicines   . SHOULDER SURGERY        Outpatient Medications Marked as Taking for the 09/18/21 encounter (Office Visit) with Shirlee More, MD   Medication Sig   . aspirin (ECOTRIN) 81 mg Oral Tablet, Delayed Release (E.C.) Take 1 Tablet (81 mg total) by mouth   . docusate sodium (COLACE) 100 mg Oral Capsule Take 1 Capsule (100 mg total) by mouth   . ergocalciferol, vitamin D2, (DRISDOL) 1,250 mcg (50,000 unit) Oral Capsule Take 1 Capsule (50,000 Units total) by mouth      No Known Allergies        BP 135/77 (Site: Left, Patient Position: Sitting)   Pulse 81   Temp 36.3 C (97.3 F)   Ht 1.854 m (6\' 1" )   Wt (!) 147 kg (323 lb 3.2 oz)   SpO2 95%   BMI 42.64 kg/m          General: appropriate for age. in no acute distress.    Vital signs are present above and have been reviewed by me     HEENT: Atraumatic,  Normocephalic.    Lungs: Nonlabored breathing with symmetric expansion    Heart:Regular wth respect to rate and rythmn.    Abdomen:Soft. Nontender. Nondistended     Extremity:  Right heel with multiple ulcerative area that are full thickness skin and subcutaneous tissue.  No exposed tendon.  Moderate amount of drainage from the site                Psychiatric: Alert and oriented to person, place, and time. affect appropriate       Assessment/Plan:  Assessment/Plan   1. Non-healing ulcer of right ankle, unspecified ulcer stage (CMS HCC)         Not significantly improved from discharge date at hospital.  Moderate amount of fibrinous /purulent drainage.  Immunocomprimised    Will try and arrange [facility] Wound VAC    Will also see about [facility] evaluation.      This note was partially created using voice recognition software and is inherently subject to errors including those of syntax and "sound alike " substitutions which may escape proof reading. In such instances, original meaning may be extrapolated by contextual derivation.    Bridgett Larsson MD MBA CPE FACS

## 2021-09-23 ENCOUNTER — Encounter (HOSPITAL_BASED_OUTPATIENT_CLINIC_OR_DEPARTMENT_OTHER): Payer: Medicare Other | Admitting: Infectious Disease

## 2021-09-23 DIAGNOSIS — L97312 Non-pressure chronic ulcer of right ankle with fat layer exposed: Secondary | ICD-10-CM

## 2021-09-23 NOTE — Progress Notes (Signed)
ID E-consult Note    Common Order Information     Diagnosis -> Chronic ulcer of right ankle with fat layer exposed (CMS [employer])     Order Specific Information   Order: Refer to COpAT (Complex Outpatient Oral Antimicrobial Therapy)        Associated Diagnoses    L97.319 Non-healing ulcer of right ankle, unspecified ulcer stage (CMS HCC)    Antimicrobial(s) Choice: -> oral    Total Duration Of Therapy: -> 4     Pertinent Clinical History:  62M, S/P renal transplant 2015 and on immunosuppressives, recently hospitalized (5/3 to 09/10/21) for a Rt foot/ankle soft tissue polymicrobial infection S/P I&D 5/6 with OR cultures positive for Strep, Actino, Anaerobes (no cultures sent for fungal or AFB).  CT with soft tissue gas but no evidence of bone/joint infection.  Has been on oral antimicrobials with Linezolid (till 5/16) and Augmentin (till 5/25) since discharged.  Wound still with moderate drainage at 09/18/21 follow up visit.    Co-morbids:  DM, CKD, legally blind, OSA, BMI 42.    Labs reviewed:    5/4 CRP 50, WBC 12.1  5/18 WBC 4.9, Hb 9.3, Plt 199, B/Cr 28/2.1, A1C 5.9,     Photographs in media tab:  09/05/21 - 2 bullous necrotic lesions; Rt posterior heel> lateral malleolus lesion  09/18/21 - Non healing deep ulcers/surgical wounds with drainage, surrounding erythema and desquamation    Assessment/Recommendations:  Immunocompromised patient with necrotizing polymicrobial Rt foot infection S/P I&D 09/07/21, treated post operatively with PO Linezolid 1 week/Augmentin (on going)    Agree with plans for total 3-4 weeks PO Augmentin after 5/6 formal surgery  May need further I&D's - please send operative samples for fungal and AFB cultures as well  Please monitor weekly CBC with diff, Creatinine, CRP    If concerns that infection is persisting and becoming limb-threatening, may need to be referred to his transplant center or a tertiary care facility    I spent 5 minutes or more reviewing the patient's medical record,   lab/imaging studies, and/or medications.  Total time 30-40 min.    Kharisma Glasner R. Gloriajean Dell, MD

## 2021-09-25 ENCOUNTER — Encounter (INDEPENDENT_AMBULATORY_CARE_PROVIDER_SITE_OTHER): Payer: Self-pay | Admitting: Surgery

## 2021-09-25 ENCOUNTER — Ambulatory Visit (INDEPENDENT_AMBULATORY_CARE_PROVIDER_SITE_OTHER): Payer: Medicare Other | Admitting: Surgery

## 2021-09-25 ENCOUNTER — Other Ambulatory Visit: Payer: Self-pay

## 2021-09-25 VITALS — BP 142/72 | HR 82 | Temp 98.2°F | Ht 72.0 in | Wt 320.8 lb

## 2021-09-25 DIAGNOSIS — L97319 Non-pressure chronic ulcer of right ankle with unspecified severity: Secondary | ICD-10-CM

## 2021-09-25 DIAGNOSIS — E1142 Type 2 diabetes mellitus with diabetic polyneuropathy: Secondary | ICD-10-CM

## 2021-09-25 DIAGNOSIS — E119 Type 2 diabetes mellitus without complications: Secondary | ICD-10-CM

## 2021-09-25 NOTE — Nursing Note (Signed)
Wet to dry dressing applied to patients right ankle and heel. Heel wound measuring 10 x 3. Ankle measures 3 x 1. Tolerated well. Arvella Nigh, LPN

## 2021-09-27 ENCOUNTER — Other Ambulatory Visit (RURAL_HEALTH_CENTER): Payer: Self-pay | Admitting: Internal Medicine

## 2021-09-27 ENCOUNTER — Telehealth (INDEPENDENT_AMBULATORY_CARE_PROVIDER_SITE_OTHER): Payer: Self-pay | Admitting: Surgery

## 2021-09-27 ENCOUNTER — Ambulatory Visit (INDEPENDENT_AMBULATORY_CARE_PROVIDER_SITE_OTHER): Payer: Self-pay

## 2021-09-27 MED ORDER — HYDROCHLOROTHIAZIDE 25 MG TABLET
25.0000 mg | ORAL_TABLET | Freq: Every day | ORAL | 2 refills | Status: DC
Start: 2021-09-27 — End: 2021-12-19

## 2021-09-27 NOTE — Telephone Encounter (Signed)
Routing message to CoPAT and Dr Madolyn Frieze. Ennis Forts, RN

## 2021-09-27 NOTE — Telephone Encounter (Signed)
Discussed with patient over the phone.      Advised to contact Dr. Sabino Gasser - as per recommendations of the ID eConsult note suggest continuing PO Augmentin.      Patient had his transplant at [facility] - advised that he should be seen there if wound is not healing.    Demri Poulton R. Madolyn Frieze MD  Pager [phone]

## 2021-09-27 NOTE — Telephone Encounter (Signed)
Regarding: lab results?  ----- Message from Oneida Arenas sent at 09/27/2021  1:36 PM EDT -----  Merrily Brittle, MD    Pt called in stating his PCP said they had submitted a culture to te "infectious disease lab" roughly 10 days ago and had not received any results back. Saw a note from Dr. Madolyn Frieze and was unsure if this is a situation he was involded in or not. Pt's PCP is not prescribing antibiotics until result comes back. Pt was concerned given the time frame since culture was given. Please call pt to advise. Thank you!

## 2021-09-27 NOTE — Telephone Encounter (Signed)
Patient called stating he was told to call about culture results. Results on 09/07/21 positive for infection. Patient stated he finished Augmentin last night. Please advise Ma Rings, LPN

## 2021-09-28 ENCOUNTER — Encounter (INDEPENDENT_AMBULATORY_CARE_PROVIDER_SITE_OTHER): Payer: Self-pay | Admitting: Surgery

## 2021-09-28 MED ORDER — SULFAMETHOXAZOLE 800 MG-TRIMETHOPRIM 160 MG TABLET
1.0000 | ORAL_TABLET | Freq: Two times a day (BID) | ORAL | 0 refills | Status: DC
Start: 2021-09-28 — End: 2022-07-17

## 2021-09-28 NOTE — Progress Notes (Signed)
[facility] [facility]    Progress Note    Name: Martin Porter MRN:  [id number]   Date: 09/25/2021 Age: 56 y.o.          Date of Birth:  1966/02/26  PCP: Delaney Meigs, PA-C  Referring:  No ref. provider found     HPI:  Martin Porter is a 56 y.o. White male who returns for evaluation of his right healed ulceration.  He has no new complaints.  Has almost completed his antibiotics.  No fever.  Home health has been dressing his wound without difficulty.  We have submitted the paperwork for potential wound VAC but has not been approved yet.        Past Medical History:   Diagnosis Date   . Abnormal prostate specific antigen    . Anemia, unspecified    . Atrial fibrillation (CMS HCC)    . Candida infection    . CKD (chronic kidney disease) stage 3, GFR 30-59 ml/min (CMS HCC)    . Cyst of kidney, acquired    . Diabetic peripheral neuropathy (CMS HCC)    . GERD (gastroesophageal reflux disease)    . Herpes simplex    . Hiatal hernia    . Hypogonadism in male    . Hypomagnesemia    . Impotence    . Legal blindness    . Mixed hyperlipidemia    . Obesity, unspecified     12/18 pt would like referral for bariatric surgery, would like to be seen by [facility] because of his hx of transplant   . Obstructive sleep apnea syndrome     4/18 compliant with CPAP, using nasal PILLOW MILD OSA. 12/17 MILD, WILL FOLLOW UP WITH SLEEP MED FOR CPAP MAY  BE BECAUSE FATIGUE   . Peptic ulcer     9/18 seen by gen surg/Reese, EGD and sono pending gastric ulcer, no neoplasm, H pylori negative 3/18 will need to decide on f/u EGD with Dr. Waynette Buttery   . Polyp of colon    . Renal transplant recipient    . Testicular hypofunction     5/18 followed by urology, they will follow PSA and testosterone levels prescribed gel 5/18 PSA 1.4 (slightly lower than last) followed by urology 5/18 PSA velocity increased form 2016-2017, testosterone held, was to have repeat PSA/VitD and T levels in our clinic and CC to Dr.  Charlies Silvers Right testicular hypotrophy   . Tobacco dependence syndrome    . Type 2 diabetes mellitus without complications (CMS HCC)    . Unspecified glaucoma(365.9)    . Vitamin D deficiency       Past Surgical History:   Procedure Laterality Date   . COLONOSCOPY     . DEBRIDEMENT  FOOT Right    . EYE SURGERY     . FOOT SURGERY     . HX HERNIA REPAIR     . HX RENAL TRANSPLANT      1/20 pt has fu Feb 5th 12/19 Cr 1.69 GFR 45 6/19 Cr 1.41 GFR 56 3/19 Cr 1.67 GFR 46 11/18 seen by Dr. Marcy Panning, renal fx worsening, no comment on cause 11/18 Cr 1.65 GFR 478/18 Cr 1.95 GFR 38 6/18 Cr 1.5, Ca mildly high, SPE P/UPEPpending and asap f/u with nephrology 5/18 back to baseline cr 1.2 and GFR 68 CKD GFR 59 5/18 and was 59 11/17. Last Creat 1.36 increased from 1.2 11/17.   Marland Kitchen HX UPPER ENDOSCOPY     .  KIDNEY SURGERY     . 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 It seems that pt underwent  pancreas tp to "cure" DM/islet cells  a few years ago pt seen by endo and given OHG had pancreatitis, thought due to the DM medicines   . SHOULDER SURGERY        Outpatient Medications Marked as Taking for the 09/25/21 encounter (Office Visit) with Shirlee More, MD   Medication Sig   . amLODIPine (NORVASC) 5 mg Oral Tablet Take 1 Tablet (5 mg total) by mouth Once a day   . [EXPIRED] amoxicillin-pot clavulanate (AUGMENTIN) 875-125 mg Oral Tablet Take 1 Tablet by mouth Twice daily for 7 days   . aspirin (ECOTRIN) 81 mg Oral Tablet, Delayed Release (E.C.) Take 1 Tablet (81 mg total) by mouth   . atropine (ISOPTO ATROPINE) 1 % Ophthalmic Drops 1 Drop Four times a day   . carvediloL (COREG) 25 mg Oral Tablet Take 1 Tablet (25 mg total) by mouth Twice daily with food   . cholecalciferol, vitamin D3, 1,250 mcg (50,000 unit) Oral Capsule TAKE 1 CAPSULE ONCE A WEEEK   . cyanocobalamin (VITAMIN B 12) 1,000 mcg  Oral Tablet Take 2 Tablets (2,000 mcg total) by mouth   . docusate sodium (COLACE) 100 mg Oral Capsule Take 1 Capsule (100 mg total) by mouth   . dorzolamide-timoloL (COSOPT) 22.3-6.8 mg/mL Ophthalmic Drops Administer 1 Drop into the left eye Twice daily   . ergocalciferol, vitamin D2, (DRISDOL) 1,250 mcg (50,000 unit) Oral Capsule Take 1 Capsule (50,000 Units total) by mouth   . famotidine (PEPCID) 40 mg Oral Tablet Take 1 Tablet (40 mg total) by mouth Twice daily   . fenofibrate (LOFIBRA) 160 mg Oral Tablet Take 1 Tablet (160 mg total) by mouth Once a day   . gabapentin (NEURONTIN) 800 mg Oral Tablet TAKE ONE TABLET BY MOUTH THREE TIMES A DAY   . insulin aspart U-100 (NOVOLOG) 100 unit/mL (3 mL) Subcutaneous Insulin Pen Inject 25 Units under the skin Twice a day before meals 25 units every morning  15 units every evening   . insulin glargine 100 unit/mL Subcutaneous injection Inject 45 Units under the skin Every morning with breakfast for 30 days   . insulin glargine 100 unit/mL Subcutaneous injection Inject 35 Units under the skin Every night   . iron ps complex-B12-folic acid (FERREX FORTE) 150-25-1 mg-mcg-mg Oral Capsule Take 1 Capsule by mouth Once a day   . L. acidophilus/L. bifidus (LACTOBACILLUS ACIDOPH-L. BIFID ORAL) Take 2 Tablets by mouth   . magnesium oxide (MAG-OX) 400 mg Oral Tablet TAKE 2 TABLETS DAILY FOR 3 DAYS THEN TAKE 1 TABLET DAILY   . mycophenolate sodium (MYFORTIC) 180 mg Oral Tablet, Delayed Release (E.C.) Take 3 Tablets (540 mg total) by mouth Twice daily   . nystatin (MYCOSTATIN) 100,000 unit/gram Cream Apply topically Twice daily   . ondansetron (ZOFRAN) 4 mg Oral Tablet Take 1 Tablet (4 mg total) by mouth Every 8 hours as needed for Nausea/Vomiting   . pancreatic enzyme replacement (CREON) 12,000-38,000 -60,000 unit Oral Capsule, Delayed Release(E.C.) TAKE FOUR CAPSULES BY MOUTH TWICE A DAY   . pantoprazole (PROTONIX) 40 mg Oral Tablet, Delayed Release (E.C.) Take 1 Tablet (40 mg total)  by mouth Once a day   . potassium citrate (UROCIT-K) 10  mEq (1,080 mg) Oral Tablet Sustained Release Take 1 Tablet (10 mEq total) by mouth Twice daily with food   . pravastatin (PRAVACHOL) 40 mg Oral Tablet Take 1 Tablet (40 mg total) by mouth Every evening   . prednisoLONE acetate (PRED FORTE) 1 % Ophthalmic Drops, Suspension Instill 1 Drop into both eyes Twice daily   . promethazine-dextromethorphan (PHENERGAN-DM) 6.25-15 mg/5 mL Oral Syrup Take 5 mL by mouth Four times a day as needed for Cough   . tacrolimus (PROGRAF) 1 mg Oral Capsule Take 3 Capsules (3 mg total) by mouth Twice daily   . testosterone (ANDROGEL) 20.25 mg/1.25 gram (1.62 %) Transdermal Gel in Metered-dose Pump Place 20.25 mg on the skin Once a day   . trimethoprim-sulfamethoxazole (BACTRIM) 80-400mg  per tablet    . TRUEPLUS PEN NEEDLE 31 gauge x 3/16" Does not apply Needle USE 5 TIMES A DAY   . [DISCONTINUED] hydroCHLOROthiazide (HYDRODIURIL) 25 mg Oral Tablet Take 1 Tablet (25 mg total) by mouth Once a day      No Known Allergies        BP (!) 142/72   Pulse 82   Temp 36.8 C (98.2 F)   Ht 1.829 m (6')   Wt (!) 146 kg (320 lb 12.8 oz)   SpO2 96%   BMI 43.51 kg/m          General: appropriate for age. in no acute distress.    Vital signs are present above and have been reviewed by me     HEENT: Atraumatic, Normocephalic.    Lungs: Nonlabored breathing with symmetric expansion    Heart:Regular wth respect to rate and rythmn.    Abdomen:Soft. Nontender. Nondistended     Extremity:  Still very indurated appearance of his heel.  No exposed tendon.  No improvement from last week.                Psychiatric: Alert and oriented to person, place, and time. affect appropriate       Assessment/Plan:  Assessment/Plan   1. Non-healing ulcer of right ankle, unspecified ulcer stage (CMS HCC)       Very concerned about the appearance of his heel.  Have submitted his case to the Infectious Disease outpatient consulting for through [facility].  Awaiting response from them.  Will plan for an MRI of his foot to make sure his Achilles tendon or joint is not involved.  His immunosuppressants are complicating this significantly.  May need IV antibiotics if cannot make some improvement of this ulceration soon      This note was partially created using voice recognition software and is inherently subject to errors including those of syntax and "sound alike " substitutions which may escape proof reading. In such instances, original meaning may be extrapolated by contextual derivation.    Bridgett Larsson MD MBA CPE FACS

## 2021-10-01 NOTE — Telephone Encounter (Signed)
Left message for patient regarding abx. Ma Rings, LPN  [phone] 70:78

## 2021-10-02 NOTE — Telephone Encounter (Signed)
Patient returned call. Informed of abx. Voiced understanding. Arvella Nigh, LPN

## 2021-10-04 ENCOUNTER — Telehealth (INDEPENDENT_AMBULATORY_CARE_PROVIDER_SITE_OTHER): Payer: Self-pay | Admitting: Surgery

## 2021-10-04 NOTE — Telephone Encounter (Signed)
Wound vac orders faxed to home health Ma Rings, LPN  06/10/35 04:88

## 2021-10-04 NOTE — Telephone Encounter (Signed)
Attempted to contact patient. Left detailed message and informed patient to return call. Ma Rings, LPN  [phone] 58:85

## 2021-10-08 IMAGING — MR MRI FOOT RT WO CONTRAST
5 of 10 series · 19 of 40 positions shown · IV contrast (gadolinium)
Comparison: None available.

﻿EXAM:  [id number]   MRI FOOT RT WO CONTRAST
INDICATION: Diabetic ulcer.
TECHNIQUE: Multiplanar multisequential MRI of the right foot was performed without gadolinium contrast.

[Series 5: T1 · sagittal · right · 3.2mm · 0.49mm/px · 3 of 24 slices shown (1 of 4)]
[im 1/24]
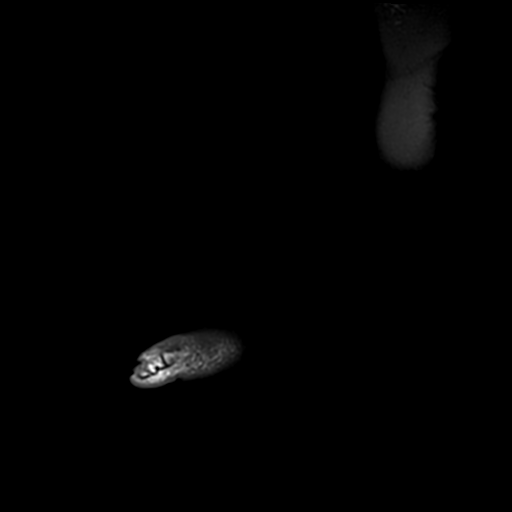
[im 12/24]
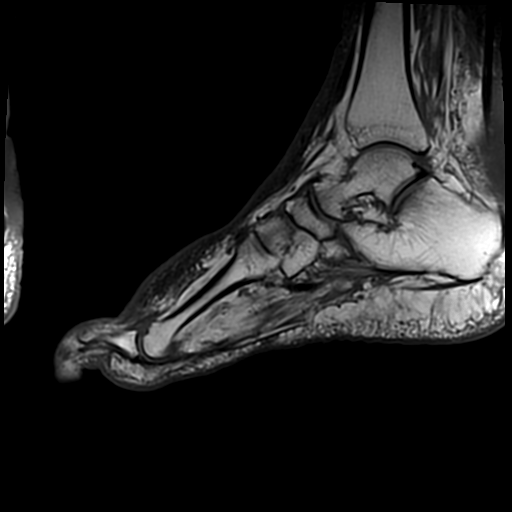
[im 24/24]
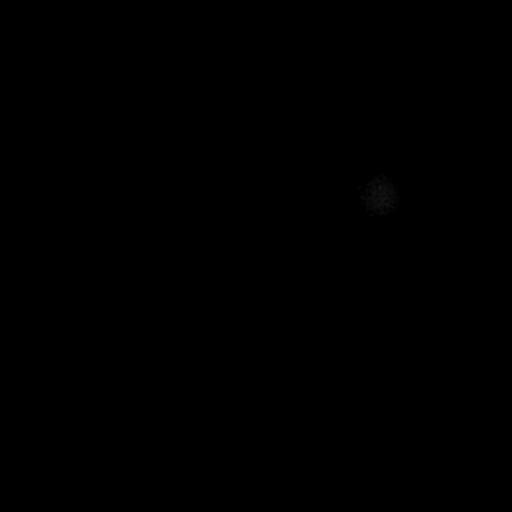

[Series 8: T1 · coronal · right · 5.0mm · 0.29mm/px · 5 of 34 slices shown (2 of 4)]
[im 1/34]
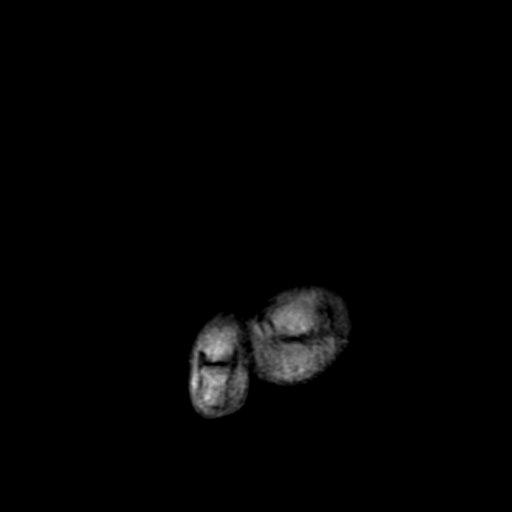
[im 9/34]
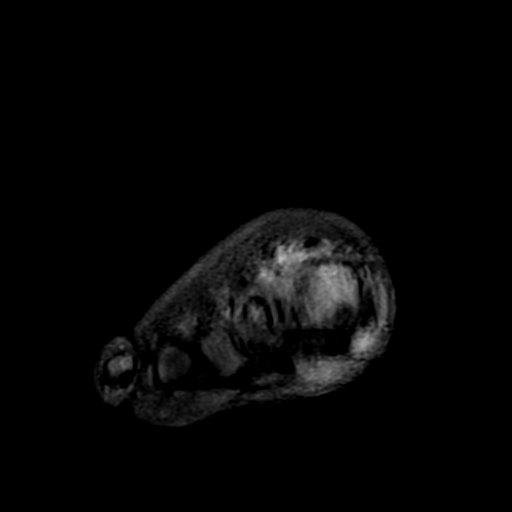
[im 17/34]
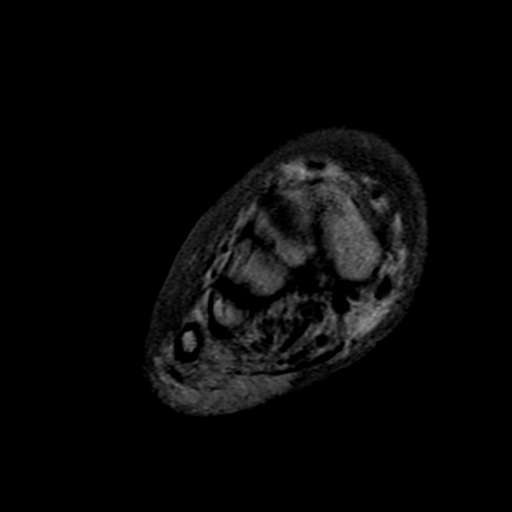
[im 25/34]
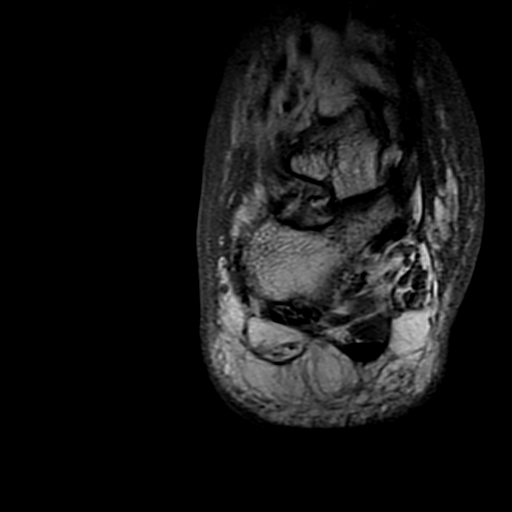
[im 34/34]
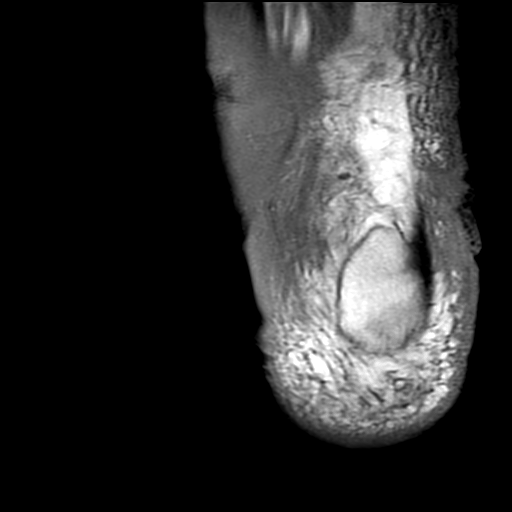

[Series 10: T1 · coronal · right · 5.0mm · 0.29mm/px · 5 of 34 slices shown (3 of 4)]
[im 1/34]
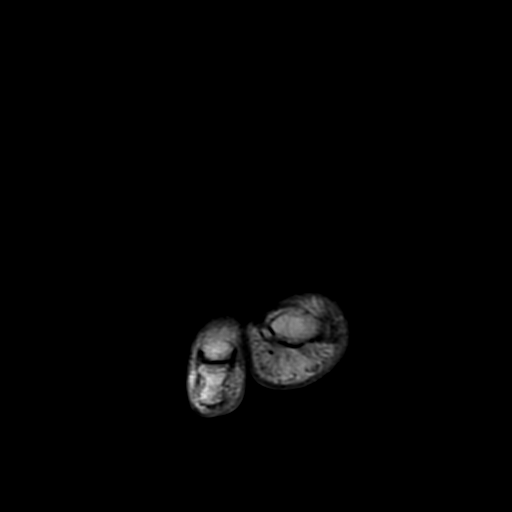
[im 9/34]
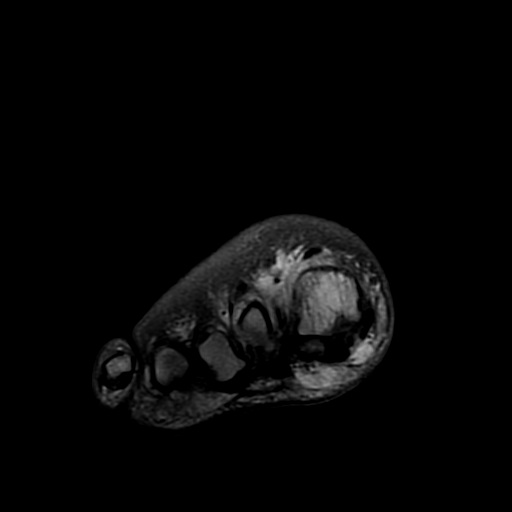
[im 17/34]
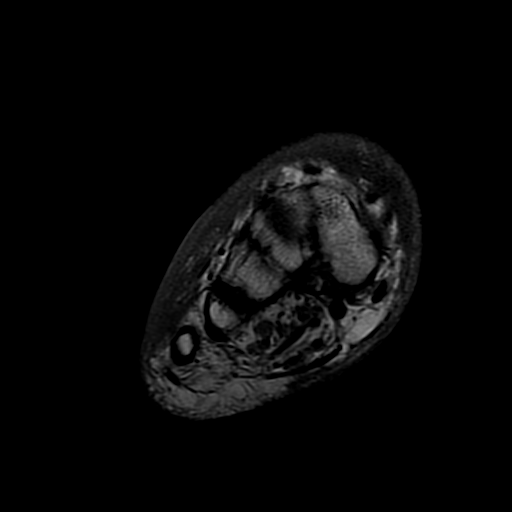
[im 25/34]
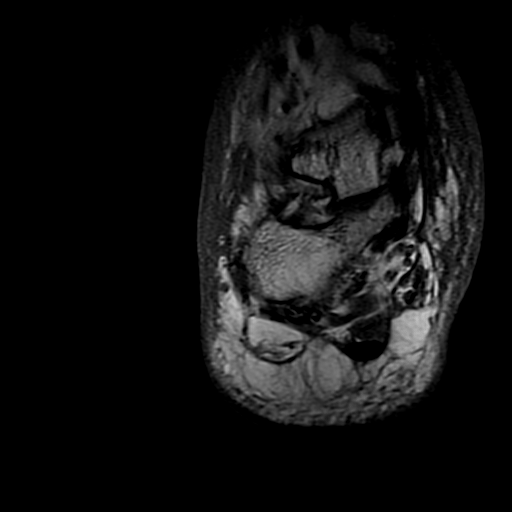
[im 34/34]
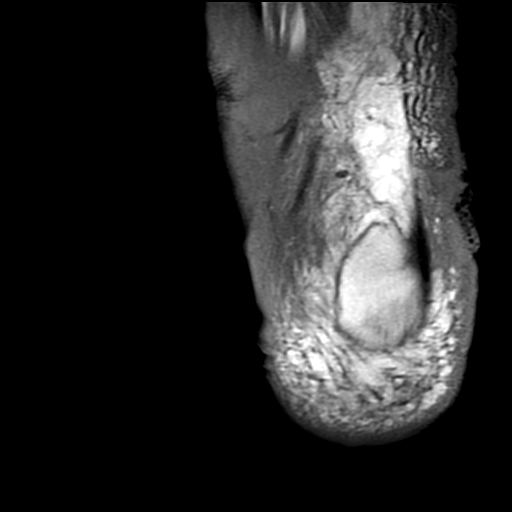

[Series 11: T1 · axial · right · 3.2mm · 0.49mm/px · z∈[-112,-80]mm · 2 of 26 slices shown (4 of 4)]
[im 1/26]
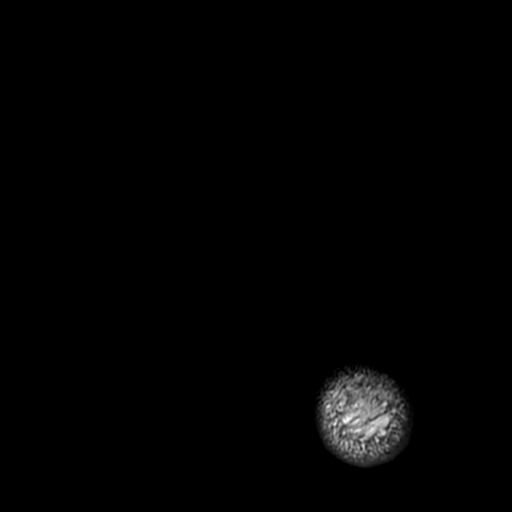
[im 9/26]
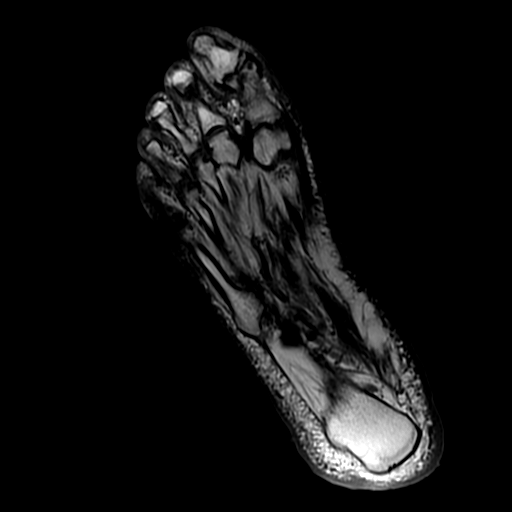

[Series 12: T2 fat-sat · axial · right · 3.2mm · 0.49mm/px · z∈[-112,-12]mm · 4 of 26 slices shown]
[im 1/26]
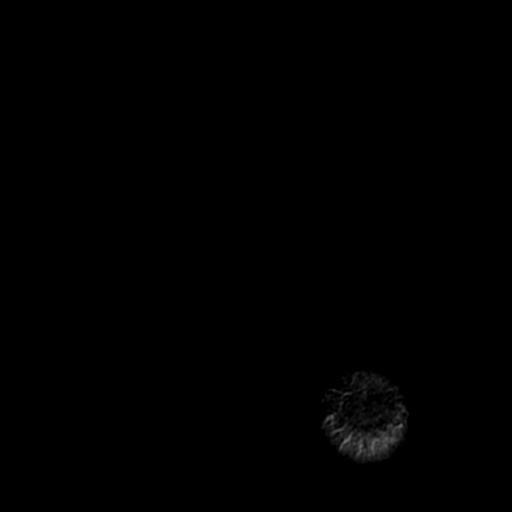
[im 9/26]
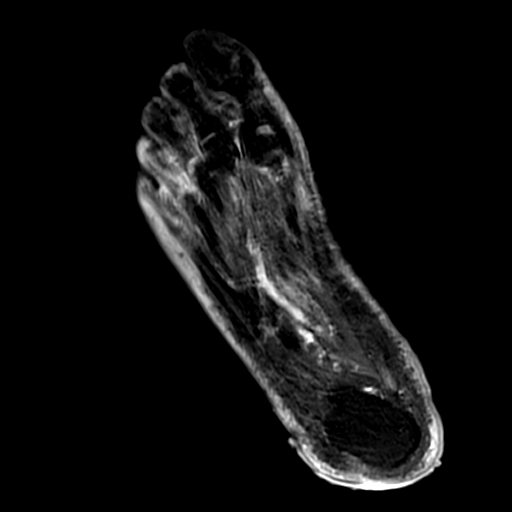
[im 17/26]
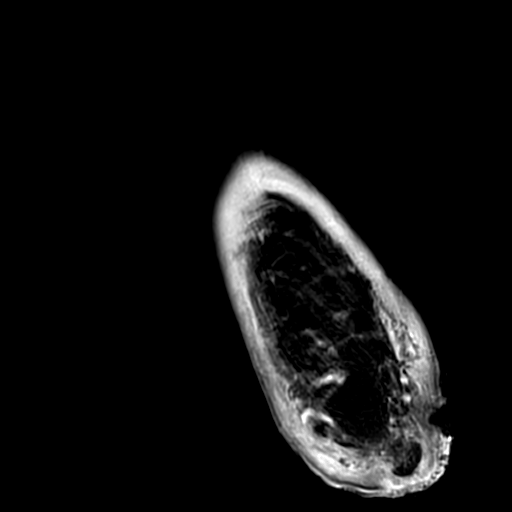
[im 26/26]
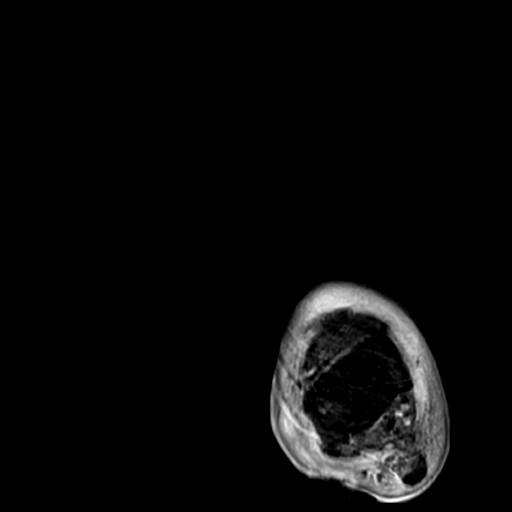

[19 of 40 positions shown; findings below may reference images not displayed]

FINDINGS: Bone marrow signal intensity is normal. There is no acute fracture or subluxation. No definite evidence of osteomyelitis is seen.  There is mild-to-moderate midfoot osteoarthritis. Normal T1 signal intensity is noted within the sinus tarsi.  There is no osteochondral lesion of the talar dome.  Lisfranc ligament appears intact.  There is moderate fatty atrophy involving the intrinsic muscles of the forefoot.  Plantar aponeurosis is normal without evidence of fasciitis, fibromatosis or tear.  Moderate dorsal subcutaneous edema is also seen.
IMPRESSION: Moderate dorsal subcutaneous edema. No definite evidence of osteomyelitis.

## 2021-10-09 ENCOUNTER — Encounter (INDEPENDENT_AMBULATORY_CARE_PROVIDER_SITE_OTHER): Payer: Self-pay | Admitting: Surgery

## 2021-10-09 ENCOUNTER — Other Ambulatory Visit: Payer: Self-pay

## 2021-10-09 ENCOUNTER — Ambulatory Visit (INDEPENDENT_AMBULATORY_CARE_PROVIDER_SITE_OTHER): Payer: Medicare Other | Admitting: Surgery

## 2021-10-09 VITALS — BP 125/65 | HR 72 | Temp 97.1°F | Ht 72.0 in | Wt 319.4 lb

## 2021-10-09 DIAGNOSIS — L97319 Non-pressure chronic ulcer of right ankle with unspecified severity: Secondary | ICD-10-CM

## 2021-10-09 NOTE — Progress Notes (Addendum)
[facility]    Progress Note    Name: Martin Porter MRN:  [id number]   Date: 10/09/2021 Age: 56 y.o.          Date of Birth:  07/18/1965  PCP: Delaney Meigs, PA-C  Referring:  No ref. provider found     HPI:  Martin Porter is a 56 y.o. White male who returns for evaluation of his right posterior heel ulceration.  Several changes since I saw him last.  He was able to get the [employer] wound VAC applied.  He is noticed improvement with that.  He is continued to take the antibiotics prescribed.  In addition had the MRI performed yesterday at [facility].  I do not have the report for that at this point.  He has no new complaints.          Past Medical History:   Diagnosis Date   . Abnormal prostate specific antigen    . Anemia, unspecified    . Atrial fibrillation (CMS HCC)    . Candida infection    . CKD (chronic kidney disease) stage 3, GFR 30-59 ml/min (CMS HCC)    . Cyst of kidney, acquired    . Diabetic peripheral neuropathy (CMS HCC)    . GERD (gastroesophageal reflux disease)    . Herpes simplex    . Hiatal hernia    . Hypogonadism in male    . Hypomagnesemia    . Impotence    . Legal blindness    . Mixed hyperlipidemia    . Obesity, unspecified     12/18 pt would like referral for bariatric surgery, would like to be seen by [facility] because of his hx of transplant   . Obstructive sleep apnea syndrome     4/18 compliant with CPAP, using nasal PILLOW MILD OSA. 12/17 MILD, WILL FOLLOW UP WITH SLEEP MED FOR CPAP MAY  BE BECAUSE FATIGUE   . Peptic ulcer     9/18 seen by gen surg/Reese, EGD and sono pending gastric ulcer, no neoplasm, H pylori negative 3/18 will need to decide on f/u EGD with Dr. Waynette Buttery   . Polyp of colon    . Renal transplant recipient    . Testicular hypofunction     5/18 followed by urology, they will follow PSA and testosterone levels prescribed gel 5/18 PSA 1.4 (slightly lower than last) followed by urology 5/18 PSA velocity  increased form 2016-2017, testosterone held, was to have repeat PSA/VitD and T levels in our clinic and CC to Dr. Charlies Silvers Right testicular hypotrophy   . Tobacco dependence syndrome    . Type 2 diabetes mellitus without complications (CMS HCC)    . Unspecified glaucoma(365.9)    . Vitamin D deficiency       Past Surgical History:   Procedure Laterality Date   . COLONOSCOPY     . DEBRIDEMENT  FOOT Right    . EYE SURGERY     . FOOT SURGERY     . HX HERNIA REPAIR     . HX RENAL TRANSPLANT      1/20 pt has fu Feb 5th 12/19 Cr 1.69 GFR 45 6/19 Cr 1.41 GFR 56 3/19 Cr 1.67 GFR 46 11/18 seen by Dr. Marcy Panning, renal fx worsening, no comment on cause 11/18 Cr 1.65 GFR 478/18 Cr 1.95 GFR 38 6/18 Cr 1.5, Ca mildly high, SPE P/UPEPpending and asap f/u with nephrology 5/18 back to baseline cr  1.2 and GFR 68 CKD GFR 59 5/18 and was 59 11/17. Last Creat 1.36 increased from 1.2 11/17.   Marland Kitchen HX UPPER ENDOSCOPY     . KIDNEY SURGERY     . 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 It seems that pt underwent  pancreas tp to "cure" DM/islet cells  a few years ago pt seen by endo and given OHG had pancreatitis, thought due to the DM medicines   . SHOULDER SURGERY        Outpatient Medications Marked as Taking for the 10/09/21 encounter (Office Visit) with Shirlee More, MD   Medication Sig   . amLODIPine (NORVASC) 5 mg Oral Tablet Take 1 Tablet (5 mg total) by mouth Once a day   . aspirin (ECOTRIN) 81 mg Oral Tablet, Delayed Release (E.C.) Take 1 Tablet (81 mg total) by mouth   . atropine (ISOPTO ATROPINE) 1 % Ophthalmic Drops 1 Drop Four times a day   . carvediloL (COREG) 25 mg Oral Tablet Take 1 Tablet (25 mg total) by mouth Twice daily with food   . cholecalciferol, vitamin D3, 1,250 mcg (50,000 unit) Oral Capsule TAKE 1 CAPSULE ONCE A WEEEK   . cyanocobalamin (VITAMIN B 12) 1,000 mcg Oral  Tablet Take 2 Tablets (2,000 mcg total) by mouth   . docusate sodium (COLACE) 100 mg Oral Capsule Take 1 Capsule (100 mg total) by mouth   . dorzolamide-timoloL (COSOPT) 22.3-6.8 mg/mL Ophthalmic Drops Administer 1 Drop into the left eye Twice daily   . ergocalciferol, vitamin D2, (DRISDOL) 1,250 mcg (50,000 unit) Oral Capsule Take 1 Capsule (50,000 Units total) by mouth   . famotidine (PEPCID) 40 mg Oral Tablet Take 1 Tablet (40 mg total) by mouth Twice daily   . fenofibrate (LOFIBRA) 160 mg Oral Tablet Take 1 Tablet (160 mg total) by mouth Once a day   . gabapentin (NEURONTIN) 800 mg Oral Tablet TAKE ONE TABLET BY MOUTH THREE TIMES A DAY   . hydroCHLOROthiazide (HYDRODIURIL) 25 mg Oral Tablet Take 1 Tablet (25 mg total) by mouth Once a day   . insulin aspart U-100 (NOVOLOG) 100 unit/mL (3 mL) Subcutaneous Insulin Pen Inject 25 Units under the skin Twice a day before meals 25 units every morning  15 units every evening   . insulin glargine 100 unit/mL Subcutaneous injection Inject 45 Units under the skin Every morning with breakfast for 30 days   . insulin glargine 100 unit/mL Subcutaneous injection Inject 35 Units under the skin Every night   . iron ps complex-B12-folic acid (FERREX FORTE) 150-25-1 mg-mcg-mg Oral Capsule Take 1 Capsule by mouth Once a day   . L. acidophilus/L. bifidus (LACTOBACILLUS ACIDOPH-L. BIFID ORAL) Take 2 Tablets by mouth   . magnesium oxide (MAG-OX) 400 mg Oral Tablet TAKE 2 TABLETS DAILY FOR 3 DAYS THEN TAKE 1 TABLET DAILY   . mycophenolate sodium (MYFORTIC) 180 mg Oral Tablet, Delayed Release (E.C.) Take 3 Tablets (540 mg total) by mouth Twice daily   . nystatin (MYCOSTATIN) 100,000 unit/gram Cream Apply topically Twice daily   . ondansetron (ZOFRAN) 4 mg Oral Tablet Take 1 Tablet (4 mg total) by mouth Every 8 hours as needed for Nausea/Vomiting   . pancreatic enzyme replacement (CREON) 12,000-38,000 -60,000 unit Oral Capsule, Delayed Release(E.C.) TAKE FOUR CAPSULES BY MOUTH TWICE A DAY    .  pantoprazole (PROTONIX) 40 mg Oral Tablet, Delayed Release (E.C.) Take 1 Tablet (40 mg total) by mouth Once a day   . potassium citrate (UROCIT-K) 10 mEq (1,080 mg) Oral Tablet Sustained Release Take 1 Tablet (10 mEq total) by mouth Twice daily with food   . pravastatin (PRAVACHOL) 40 mg Oral Tablet Take 1 Tablet (40 mg total) by mouth Every evening   . prednisoLONE acetate (PRED FORTE) 1 % Ophthalmic Drops, Suspension Instill 1 Drop into both eyes Twice daily   . promethazine-dextromethorphan (PHENERGAN-DM) 6.25-15 mg/5 mL Oral Syrup Take 5 mL by mouth Four times a day as needed for Cough   . tacrolimus (PROGRAF) 1 mg Oral Capsule Take 3 Capsules (3 mg total) by mouth Twice daily   . testosterone (ANDROGEL) 20.25 mg/1.25 gram (1.62 %) Transdermal Gel in Metered-dose Pump Place 20.25 mg on the skin Once a day   . trimethoprim-sulfamethoxazole (BACTRIM DS) 160-800mg  per tablet Take 1 Tablet (160 mg total) by mouth Twice daily   . trimethoprim-sulfamethoxazole (BACTRIM) 80-400mg  per tablet    . TRUEPLUS PEN NEEDLE 31 gauge x 3/16" Does not apply Needle USE 5 TIMES A DAY      No Known Allergies        BP 125/65   Pulse 72   Temp 36.2 C (97.1 F)   Ht 1.829 m (6')   Wt (!) 145 kg (319 lb 6.4 oz)   SpO2 95%   BMI 43.32 kg/m          General: appropriate for age. in no acute distress.  Morbidly obese    Vital signs are present above and have been reviewed by me     HEENT: Atraumatic, Normocephalic.    Lungs: Nonlabored breathing with symmetric expansion    Heart:Regular wth respect to rate and rythmn.    Abdomen:Soft. Nontender. Nondistended.  Large dependent pannus    Extremity:  Right posterior heel definitely improved.  Good granulation tissue much less excoriation to the skin.                    Psychiatric: Alert and oriented to person, place, and time. affect appropriate       Assessment/Plan:  Assessment/Plan   1. Non-healing ulcer of right ankle, unspecified ulcer stage (CMS HCC)         Definitely  improved after the wound VAC application.  Hopefully will continue to have improvement with this.  Complete antibiotics as prescribed.  Awaiting MRI report.  Unless MRI shows an abnormality that needs to be addressed sooner I would plan to see him in 2 weeks      This note was partially created using voice recognition software and is inherently subject to errors including those of syntax and "sound alike " substitutions which may escape proof reading. In such instances, original meaning may be extrapolated by contextual derivation.    Bridgett Larsson MD MBA CPE FACS

## 2021-10-14 ENCOUNTER — Other Ambulatory Visit (INDEPENDENT_AMBULATORY_CARE_PROVIDER_SITE_OTHER): Payer: Self-pay | Admitting: Surgery

## 2021-10-14 NOTE — Telephone Encounter (Signed)
Pharmacy is requesting refill on bactrim, he has an appointment on the 28th, Need an appointment first? Shelby Mattocks, LPN  [phone]

## 2021-10-16 NOTE — Telephone Encounter (Signed)
Tried to reach patient no answer left a  Detailed message explaining no refill at this time, contact office with concerns. Margit Hanks, LPN  [phone] 78:67.

## 2021-10-17 ENCOUNTER — Ambulatory Visit (INDEPENDENT_AMBULATORY_CARE_PROVIDER_SITE_OTHER): Payer: Self-pay | Admitting: Internal Medicine

## 2021-10-17 ENCOUNTER — Other Ambulatory Visit (RURAL_HEALTH_CENTER): Payer: Self-pay | Admitting: Internal Medicine

## 2021-10-17 NOTE — Telephone Encounter (Signed)
Patient returned phone call. Informed patient he will not take antibiotics for now. When he comes for office visit on 10/30/21, dr Sabino Gasser will determine if it needs to be refilled. Voiced understanding. Ma Rings, LPN  [phone]/95/[phone] 08:65

## 2021-10-24 ENCOUNTER — Other Ambulatory Visit (INDEPENDENT_AMBULATORY_CARE_PROVIDER_SITE_OTHER): Payer: Self-pay | Admitting: Internal Medicine

## 2021-10-29 NOTE — Progress Notes (Deleted)
[facility]  GENERAL SURGERY, [facility]    Progress Note    Name: Martin Porter MRN:  [id number]   Date: 10/30/2021 Age: 56 y.o.          Date of Birth:  July 22, 1965  PCP: Samuella Cota, PA-C  Referring:  No ref. provider found     HPI:  Martin Porter is a 57 y.o. White male who returns for follow-up of his right posterior heel ulceration.          Past Medical History:   Diagnosis Date   . Abnormal prostate specific antigen    . Anemia, unspecified    . Atrial fibrillation (CMS HCC)    . Candida infection    . CKD (chronic kidney disease) stage 3, GFR 30-59 ml/min (CMS HCC)    . Cyst of kidney, acquired    . Diabetic peripheral neuropathy (CMS HCC)    . GERD (gastroesophageal reflux disease)    . Herpes simplex    . Hiatal hernia    . Hypogonadism in male    . Hypomagnesemia    . Impotence    . Legal blindness    . Mixed hyperlipidemia    . Obesity, unspecified     12/18 pt would like referral for bariatric surgery, would like to be seen by [facility] because of his hx of transplant   . Obstructive sleep apnea syndrome     4/18 compliant with CPAP, using nasal PILLOW MILD OSA. 12/17 MILD, WILL FOLLOW UP WITH SLEEP MED FOR CPAP MAY  BE BECAUSE FATIGUE   . Peptic ulcer     9/18 seen by gen surg/[facility]ese, EGD and sono pending gastric ulcer, no neoplasm, H pylori negative 3/18 will need to decide on f/u EGD with Dr. Despina Arias   . Polyp of colon    . Renal transplant recipient    . Testicular hypofunction     5/18 followed by urology, they will follow PSA and testosterone levels prescribed gel 5/18 PSA 1.4 (slightly lower than last) followed by urology 5/18 PSA velocity increased form 2016-2017, testosterone held, was to have repeat PSA/VitD and T levels in our clinic and CC to Dr. Ricki Miller Right testicular hypotrophy   . Tobacco dependence syndrome    . Type 2 diabetes mellitus without complications (CMS HCC)    . Unspecified glaucoma(365.9)    . Vitamin D deficiency       Past Surgical History:    Procedure Laterality Date   . COLONOSCOPY     . DEBRIDEMENT  FOOT Right    . EYE SURGERY     . FOOT SURGERY     . HX HERNIA REPAIR     . HX RENAL TRANSPLANT      1/20 pt has fu Feb 5th 12/19 Cr 1.69 GFR 45 6/19 Cr 1.41 GFR 56 3/19 Cr 1.67 GFR 46 11/18 seen by Dr. Clyde Canterbury, renal fx worsening, no comment on cause 11/18 Cr 1.65 GFR 478/18 Cr 1.95 GFR 38 6/18 Cr 1.5, Ca mildly high, SPE P/UPEPpending and asap f/u with nephrology 5/18 back to baseline cr 1.2 and GFR 68 CKD GFR 59 5/18 and was 59 11/17. Last Creat 1.36 increased from 1.2 11/17.   Marland Kitchen HX UPPER ENDOSCOPY     . KIDNEY SURGERY     . 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 It seems that pt underwent  pancreas tp to "cure" DM/islet cells  a few years ago pt seen by endo and given OHG had pancreatitis, thought due to the DM medicines   . SHOULDER SURGERY        No outpatient medications have been marked as taking for the 10/30/21 encounter (Appointment) with Esmond Plants, MD.      No Known Allergies        There were no vitals taken for this visit.         General: appropriate for age. in no acute distress.    Vital signs are present above and have been reviewed by me     HEENT: Atraumatic, Normocephalic.    Lungs: Nonlabored breathing with symmetric expansion    Heart:Regular wth respect to rate and rythmn.    Abdomen:Soft. Nontender. Nondistended     Psychiatric: Alert and oriented to person, place, and time. affect appropriate       Assessment/Plan:  Assessment/Plan   1. Non-healing ulcer of right ankle, unspecified ulcer stage (CMS HCC)         ***      This note was partially created using voice recognition software and is inherently subject to errors including those of syntax and "sound alike " substitutions which may escape proof reading. In such instances, original meaning may be extrapolated by  contextual derivation.    Maura Crandall MD MBA CPE FACS

## 2021-10-30 ENCOUNTER — Other Ambulatory Visit: Payer: Self-pay

## 2021-10-30 ENCOUNTER — Encounter (INDEPENDENT_AMBULATORY_CARE_PROVIDER_SITE_OTHER): Payer: Self-pay | Admitting: Surgery

## 2021-10-30 ENCOUNTER — Ambulatory Visit (INDEPENDENT_AMBULATORY_CARE_PROVIDER_SITE_OTHER): Payer: Medicare Other | Admitting: Surgery

## 2021-10-30 VITALS — BP 135/65 | HR 84 | Temp 97.8°F | Ht 73.0 in | Wt 327.0 lb

## 2021-10-30 DIAGNOSIS — L97319 Non-pressure chronic ulcer of right ankle with unspecified severity: Secondary | ICD-10-CM

## 2021-10-31 ENCOUNTER — Other Ambulatory Visit (INDEPENDENT_AMBULATORY_CARE_PROVIDER_SITE_OTHER): Payer: Self-pay | Admitting: Surgery

## 2021-11-01 ENCOUNTER — Encounter (INDEPENDENT_AMBULATORY_CARE_PROVIDER_SITE_OTHER): Payer: Self-pay | Admitting: Surgery

## 2021-11-01 NOTE — Progress Notes (Signed)
[facility]    Progress Note    Name: Martin Porter MRN:  [id number]   Date: 10/30/2021 Age: 56 y.o.          Date of Birth:  1965-06-15  PCP: Delaney Meigs, PA-C  Referring:  No ref. provider found     HPI:  Martin Porter is a 56 y.o. White male who returns for evaluation of his right heel ulceration.  He is currently using the wound VAC for this.  He has no new complaints.  Has the expected odor that he gets from the wound VAC but no fever.  No significant drainage.  No pain.     Has underwent a recent MRI that shows no evidence of osteomyelitis and no involvement of his Achilles tendon         Past Medical History:   Diagnosis Date   . Abnormal prostate specific antigen    . Anemia, unspecified    . Atrial fibrillation (CMS HCC)    . Candida infection    . CKD (chronic kidney disease) stage 3, GFR 30-59 ml/min (CMS HCC)    . Cyst of kidney, acquired    . Diabetic peripheral neuropathy (CMS HCC)    . GERD (gastroesophageal reflux disease)    . Herpes simplex    . Hiatal hernia    . Hypogonadism in male    . Hypomagnesemia    . Impotence    . Legal blindness    . Mixed hyperlipidemia    . Obesity, unspecified     12/18 pt would like referral for bariatric surgery, would like to be seen by [facility] because of his hx of transplant   . Obstructive sleep apnea syndrome     4/18 compliant with CPAP, using nasal PILLOW MILD OSA. 12/17 MILD, WILL FOLLOW UP WITH SLEEP MED FOR CPAP MAY  BE BECAUSE FATIGUE   . Peptic ulcer     9/18 seen by gen surg/Reese, EGD and sono pending gastric ulcer, no neoplasm, H pylori negative 3/18 will need to decide on f/u EGD with Dr. Waynette Buttery   . Polyp of colon    . Renal transplant recipient    . Testicular hypofunction     5/18 followed by urology, they will follow PSA and testosterone levels prescribed gel 5/18 PSA 1.4 (slightly lower than last) followed by urology 5/18 PSA velocity increased form 2016-2017, testosterone held, was  to have repeat PSA/VitD and T levels in our clinic and CC to Dr. Charlies Silvers Right testicular hypotrophy   . Tobacco dependence syndrome    . Type 2 diabetes mellitus without complications (CMS HCC)    . Unspecified glaucoma(365.9)    . Vitamin D deficiency       Past Surgical History:   Procedure Laterality Date   . COLONOSCOPY     . DEBRIDEMENT  FOOT Right    . EYE SURGERY     . FOOT SURGERY     . HX HERNIA REPAIR     . HX RENAL TRANSPLANT      1/20 pt has fu Feb 5th 12/19 Cr 1.69 GFR 45 6/19 Cr 1.41 GFR 56 3/19 Cr 1.67 GFR 46 11/18 seen by Dr. Marcy Panning, renal fx worsening, no comment on cause 11/18 Cr 1.65 GFR 478/18 Cr 1.95 GFR 38 6/18 Cr 1.5, Ca mildly high, SPE P/UPEPpending and asap f/u with nephrology 5/18 back to baseline cr 1.2 and GFR 68 CKD GFR  59 5/18 and was 59 11/17. Last Creat 1.36 increased from 1.2 11/17.   Marland Kitchen HX UPPER ENDOSCOPY     . KIDNEY SURGERY     . 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 It seems that pt underwent  pancreas tp to "cure" DM/islet cells  a few years ago pt seen by endo and given OHG had pancreatitis, thought due to the DM medicines   . SHOULDER SURGERY        Outpatient Medications Marked as Taking for the 10/30/21 encounter (Office Visit) with Esmond Plants, MD   Medication Sig   . amLODIPine (NORVASC) 5 mg Oral Tablet Take 1 Tablet (5 mg total) by mouth Once a day   . aspirin (ECOTRIN) 81 mg Oral Tablet, Delayed Release (E.C.) Take 1 Tablet (81 mg total) by mouth   . atenoloL (TENORMIN) 50 mg Oral Tablet    . atropine (ISOPTO ATROPINE) 1 % Ophthalmic Drops 1 Drop Four times a day   . carvediloL (COREG) 25 mg Oral Tablet Take 1 Tablet (25 mg total) by mouth Twice daily with food   . cholecalciferol, vitamin D3, 1,250 mcg (50,000 unit) Oral Capsule TAKE 1 CAPSULE ONCE A WEEEK   . cyanocobalamin (VITAMIN B 12) 1,000 mcg Oral Tablet  Take 2 Tablets (2,000 mcg total) by mouth   . docusate sodium (COLACE) 100 mg Oral Capsule Take 1 Capsule (100 mg total) by mouth   . dorzolamide-timoloL (COSOPT) 22.3-6.8 mg/mL Ophthalmic Drops Administer 1 Drop into the left eye Twice daily   . ergocalciferol, vitamin D2, (DRISDOL) 1,250 mcg (50,000 unit) Oral Capsule Take 1 Capsule (50,000 Units total) by mouth   . famotidine (PEPCID) 40 mg Oral Tablet Take 1 Tablet (40 mg total) by mouth Twice daily   . fenofibrate (LOFIBRA) 160 mg Oral Tablet Take 1 Tablet (160 mg total) by mouth Once a day   . gabapentin (NEURONTIN) 800 mg Oral Tablet TAKE ONE TABLET BY MOUTH THREE TIMES A DAY   . hydroCHLOROthiazide (HYDRODIURIL) 25 mg Oral Tablet Take 1 Tablet (25 mg total) by mouth Once a day   . insulin aspart U-100 (NOVOLOG) 100 unit/mL (3 mL) Subcutaneous Insulin Pen Inject 25 Units under the skin Twice a day before meals 25 units every morning  15 units every evening   . insulin glargine 100 unit/mL Subcutaneous injection Inject 35 Units under the skin Every night   . iron ps complex-B12-folic acid (FERREX FORTE) 150-25-1 mg-mcg-mg Oral Capsule Take 1 Capsule by mouth Once a day   . L. acidophilus/L. bifidus (LACTOBACILLUS ACIDOPH-L. BIFID ORAL) Take 2 Tablets by mouth   . magnesium oxide (MAG-OX) 400 mg Oral Tablet TAKE 2 TABLETS DAILY FOR 3 DAYS THEN TAKE 1 TABLET DAILY   . mycophenolate sodium (MYFORTIC) 180 mg Oral Tablet, Delayed Release (E.C.) Take 3 Tablets (540 mg total) by mouth Twice daily   . nystatin (MYCOSTATIN) 100,000 unit/gram Cream Apply topically Twice daily   . ondansetron (ZOFRAN) 4 mg Oral Tablet Take 1 Tablet (4 mg total) by mouth Every 8 hours as needed for Nausea/Vomiting   . pancreatic enzyme replacement (CREON) 12,000-38,000 -60,000 unit Oral Capsule, Delayed Release(E.C.) TAKE FOUR CAPSULES BY MOUTH TWICE A DAY   . pantoprazole (PROTONIX) 40 mg Oral Tablet, Delayed Release (E.C.) TAKE ONE TABLET BY MOUTH TWICE A DAY   .  potassium citrate  (UROCIT-K) 10 mEq (1,080 mg) Oral Tablet Sustained Release Take 1 Tablet (10 mEq total) by mouth Twice daily with food   . pravastatin (PRAVACHOL) 40 mg Oral Tablet Take 1 Tablet (40 mg total) by mouth Every evening   . prednisoLONE acetate (PRED FORTE) 1 % Ophthalmic Drops, Suspension Instill 1 Drop into both eyes Twice daily   . promethazine-dextromethorphan (PHENERGAN-DM) 6.25-15 mg/5 mL Oral Syrup Take 5 mL by mouth Four times a day as needed for Cough   . tacrolimus (PROGRAF) 1 mg Oral Capsule Take 3 Capsules (3 mg total) by mouth Twice daily   . testosterone (ANDROGEL) 20.25 mg/1.25 gram (1.62 %) Transdermal Gel in Metered-dose Pump Place 20.25 mg on the skin Once a day   . trimethoprim-sulfamethoxazole (BACTRIM DS) 160-800mg  per tablet Take 1 Tablet (160 mg total) by mouth Twice daily   . trimethoprim-sulfamethoxazole (BACTRIM) 80-400mg  per tablet    . TRUEPLUS PEN NEEDLE 31 gauge x 3/16" Does not apply Needle USE 5 TIMES A DAY      No Known Allergies        BP 135/65 (Site: Right, Patient Position: Sitting)   Pulse 84   Temp 36.6 C (97.8 F)   Ht 1.854 m (6\' 1" )   Wt (!) 148 kg (327 lb)   SpO2 92%   BMI 43.14 kg/m          General: appropriate for age. in no acute distress.  Obese    Vital signs are present above and have been reviewed by me     HEENT: Atraumatic, Normocephalic.    Lungs: Nonlabored breathing with symmetric expansion    Heart:Regular wth respect to rate and rythmn.    Abdomen:Soft. Nontender. Nondistended     Extremity:  Right posterior heel shows superficial areas of ulceration at this point with some induration of the skin from the occlusive dressing.  The depth of all the ulcerations is now level with that of the skin.  Good granulation tissue.  No expressible drainage.  No devitalized tissue.            Psychiatric: Alert and oriented to person, place, and time. affect appropriate       Assessment/Plan:  Assessment/Plan   1. Non-healing ulcer of right ankle, unspecified ulcer  stage (CMS HCC)         Continues to slowly heal.  His immunosuppressants cause delayed healing as well.  No radiographic or physical exam findings that require any other intervention at the current time.  Recheck 2-3 weeks.  Feel that he should have a several day break from the occlusive dressing due to some of the skin changes from it.  Should be able to resume this on Friday.    This note was partially created using voice recognition software and is inherently subject to errors including those of syntax and "sound alike " substitutions which may escape proof reading. In such instances, original meaning may be extrapolated by contextual derivation.    Sunday MD MBA CPE FACS

## 2021-11-01 NOTE — Telephone Encounter (Signed)
Refill refused.   Meriam Sprague, LPN  2/29/7989 21:19

## 2021-11-04 ENCOUNTER — Encounter (INDEPENDENT_AMBULATORY_CARE_PROVIDER_SITE_OTHER): Payer: Self-pay | Admitting: Internal Medicine

## 2021-11-04 ENCOUNTER — Other Ambulatory Visit: Payer: Self-pay

## 2021-11-04 ENCOUNTER — Ambulatory Visit: Payer: Medicare Other | Attending: Internal Medicine | Admitting: Internal Medicine

## 2021-11-04 VITALS — BP 112/60 | HR 84 | Temp 98.6°F | Ht 73.0 in | Wt 306.0 lb

## 2021-11-04 DIAGNOSIS — D649 Anemia, unspecified: Secondary | ICD-10-CM | POA: Insufficient documentation

## 2021-11-04 DIAGNOSIS — E782 Mixed hyperlipidemia: Secondary | ICD-10-CM | POA: Insufficient documentation

## 2021-11-04 DIAGNOSIS — N1831 Chronic kidney disease, stage 3a: Secondary | ICD-10-CM | POA: Insufficient documentation

## 2021-11-04 DIAGNOSIS — I480 Paroxysmal atrial fibrillation: Secondary | ICD-10-CM | POA: Insufficient documentation

## 2021-11-04 DIAGNOSIS — E1122 Type 2 diabetes mellitus with diabetic chronic kidney disease: Secondary | ICD-10-CM

## 2021-11-04 DIAGNOSIS — E162 Hypoglycemia, unspecified: Secondary | ICD-10-CM | POA: Insufficient documentation

## 2021-11-04 DIAGNOSIS — Z940 Kidney transplant status: Secondary | ICD-10-CM | POA: Insufficient documentation

## 2021-11-04 DIAGNOSIS — L89509 Pressure ulcer of unspecified ankle, unspecified stage: Secondary | ICD-10-CM | POA: Insufficient documentation

## 2021-11-04 DIAGNOSIS — E119 Type 2 diabetes mellitus without complications: Secondary | ICD-10-CM | POA: Insufficient documentation

## 2021-11-04 NOTE — Progress Notes (Unsigned)
Name: Martin Porter                       Date of Birth: 09-10-1965   MRN:  [id number]                         Date of visit: 11/04/2021     PCP: Martin Cota, PA-C     Subjective  Martin Porter is a 56 y.o. year old male who presents for Follow Up (Follow up blood pressure in evening has been running a little high)   to clinic.  No specialty comments available.   Patient Active Problem List    Diagnosis Date Noted   . Cellulitis 09/05/2021   . Hypomagnesemia 07/15/2021   . Testicular hypofunction 07/15/2021   . Type 2 diabetes mellitus without complications (CMS HCC) 07/15/2021   . CKD (chronic kidney disease) stage 3, GFR 30-59 ml/min (CMS HCC) 07/15/2021   . Anemia, unspecified 07/15/2021   . Mixed hyperlipidemia 07/15/2021   . Atrial fibrillation (CMS HCC) 07/15/2021   . Diabetic peripheral neuropathy (CMS HCC) 07/15/2021   . GERD (gastroesophageal reflux disease) 07/15/2021   . Renal transplant recipient 07/15/2021   . Impotence 07/15/2021   . Hypogonadism in male 07/15/2021   . Obstructive sleep apnea syndrome 07/15/2021     4/18 compliant with CPAP, using nasal PILLOW MILD OSA. 12/17 MILD, WILL FOLLOW UP WITH SLEEP MED FOR CPAP MAY  BE BECAUSE FATIGUE     . Candida infection 07/15/2021      Current Outpatient Medications   Medication Sig   . amLODIPine (NORVASC) 5 mg Oral Tablet Take 1 Tablet (5 mg total) by mouth Once a day   . aspirin (ECOTRIN) 81 mg Oral Tablet, Delayed Release (E.C.) Take 1 Tablet (81 mg total) by mouth   . atenoloL (TENORMIN) 50 mg Oral Tablet    . atropine (ISOPTO ATROPINE) 1 % Ophthalmic Drops 1 Drop Four times a day   . carvediloL (COREG) 25 mg Oral Tablet Take 1 Tablet (25 mg total) by mouth Twice daily with food   . cholecalciferol, vitamin D3, 1,250 mcg (50,000 unit) Oral Capsule TAKE 1 CAPSULE ONCE A WEEEK   . cyanocobalamin (VITAMIN B 12) 1,000 mcg Oral Tablet Take 2 Tablets (2,000  mcg total) by mouth   . docusate sodium (COLACE) 100 mg Oral Capsule Take 1 Capsule (100 mg total) by mouth   . dorzolamide-timoloL (COSOPT) 22.3-6.8 mg/mL Ophthalmic Drops Administer 1 Drop into the left eye Twice daily   . ergocalciferol, vitamin D2, (DRISDOL) 1,250 mcg (50,000 unit) Oral Capsule Take 1 Capsule (50,000 Units total) by mouth   . famotidine (PEPCID) 40 mg Oral Tablet Take 1 Tablet (40 mg total) by mouth Twice daily   . fenofibrate (LOFIBRA) 160 mg Oral Tablet Take 1 Tablet (160 mg total) by mouth Once a day   . gabapentin (NEURONTIN) 800 mg Oral Tablet TAKE ONE TABLET BY MOUTH THREE TIMES A DAY   . hydroCHLOROthiazide (HYDRODIURIL) 25 mg Oral Tablet Take 1 Tablet (25 mg total) by mouth Once a day   . insulin aspart U-100 (NOVOLOG) 100 unit/mL (3 mL) Subcutaneous Insulin Pen Inject 25 Units under the skin Twice a day before meals 25 units every morning  15 units every evening   . insulin glargine 100 unit/mL Subcutaneous injection Inject 45 Units under the skin Every morning with breakfast for 30 days   . insulin  glargine 100 unit/mL Subcutaneous injection Inject 35 Units under the skin Every night   . iron ps complex-B12-folic acid (FERREX FORTE) 150-25-1 mg-mcg-mg Oral Capsule Take 1 Capsule by mouth Once a day   . L. acidophilus/L. bifidus (LACTOBACILLUS ACIDOPH-L. BIFID ORAL) Take 2 Tablets by mouth   . magnesium oxide (MAG-OX) 400 mg Oral Tablet TAKE 2 TABLETS DAILY FOR 3 DAYS THEN TAKE 1 TABLET DAILY   . mycophenolate sodium (MYFORTIC) 180 mg Oral Tablet, Delayed Release (E.C.) Take 3 Tablets (540 mg total) by mouth Twice daily   . nystatin (MYCOSTATIN) 100,000 unit/gram Cream Apply topically Twice daily   . ondansetron (ZOFRAN) 4 mg Oral Tablet Take 1 Tablet (4 mg total) by mouth Every 8 hours as needed for Nausea/Vomiting   . pancreatic enzyme replacement (CREON) 12,000-38,000 -60,000 unit Oral Capsule, Delayed Release(E.C.) TAKE FOUR CAPSULES BY MOUTH TWICE A DAY   . pantoprazole (PROTONIX)  40 mg Oral Tablet, Delayed Release (E.C.) TAKE ONE TABLET BY MOUTH TWICE A DAY   . potassium citrate (UROCIT-K) 10 mEq (1,080 mg) Oral Tablet Sustained Release Take 1 Tablet (10 mEq total) by mouth Twice daily with food   . pravastatin (PRAVACHOL) 40 mg Oral Tablet Take 1 Tablet (40 mg total) by mouth Every evening   . prednisoLONE acetate (PRED FORTE) 1 % Ophthalmic Drops, Suspension Instill 1 Drop into both eyes Twice daily   . promethazine-dextromethorphan (PHENERGAN-DM) 6.25-15 mg/5 mL Oral Syrup Take 5 mL by mouth Four times a day as needed for Cough   . tacrolimus (PROGRAF) 1 mg Oral Capsule Take 3 Capsules (3 mg total) by mouth Twice daily   . testosterone (ANDROGEL) 20.25 mg/1.25 gram (1.62 %) Transdermal Gel in Metered-dose Pump Place 20.25 mg on the skin Once a day   . trimethoprim-sulfamethoxazole (BACTRIM DS) 160-800mg  per tablet Take 1 Tablet (160 mg total) by mouth Twice daily   . trimethoprim-sulfamethoxazole (BACTRIM) 80-400mg  per tablet    . TRUEPLUS PEN NEEDLE 31 gauge x 3/16" Does not apply Needle USE 5 TIMES A DAY      Chronic Disease Management-AMB  Fu visit    This pt is for fu today of chronic problems  Pt is s/p hospitalization and continues to have treatment to ulcer on ankle and heel   Pt has wound vac and following with surgeon     No new problems per pt however pt has been having low BS    Pt and I discussed low BS and  Adjusting insulin Last aic was 5.9%    fu ckd 3 sees dr Martin Porter fu 1 year fu  hx of transplant dec  2005   and Martin Porter  Appts utd     needs fu with dr Martin Porter     abdominal pain  9/18 EGD Dr. Karenann Porter, antral ulcer 2016 nl HIDA 2017 nl abd ultrasound x for FLD    Anemia  fu labs     atrial fibrillation  pt reports that took coumadin after transplant and recalls 2 yrs of heparin before hand when on dialysis. Pt thinks took coumadin for 9-12 mo after transplant but pt does not recal    cyst of kidney  1/20 pt contacted transplat  team, they are not concerned about the 8 cm cyst at the time, pt scheduled for fu for Feb 5th 1/20 8 cm seen on transplanted kidney, sending to transplant team, need to verify that this is not worrisome    diabetes mellitus type 2  Pt has been in  [place] control but  Too much control  Pt's Aic 5.9%  Pt and I discussed how detrimental low BS can be   Insulin needs to be adjusted   Pt brings up medication oral agents he has been on in the past and he would like to be off insulin and oral agents only   However as he has had a renal transplant and can not take medications he was on in the past       diabetic peripheral neuropathy    essential hypertension bp stable here and at home    gastroesophageal reflux disease currently stable    glaucoma  2020 pt has fu at [facility] severe Right, followed at [facility], last visit 5/17 sees every 11 mo.    Hx of his Herpes simplex    history of renal transplant left  dec  2005 [facility]   1/20 pt has fu Feb 5th 12/19 Cr 1.69 GFR 45 6/19 Cr 1.41 GFR 56 3/19 Cr 1.67 GFR 46 11/18 seen by Dr. Clyde Canterbury, renal fx worsening, no comment on cause 11/18 Cr 1.65 GFR 47    history of transplantation of pancreas  12/19 Tac level nl 8.4 amylase and lipse nl PK txp 2005, pt reports had renal failure and was on HD, qualified for ki tp. 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    hyperlipidemia  Stable on fenofibrate and statin     legal blindness    obesity  12/18 pt would like referral for bariatric surgery, would like to be seen by [facility] because of his hx of transplant    obstructive sleep apnea syndrome  4/18 compliant with CPAP, using nasal pillow, MILD OSA 12/17 mild, will f/u with sleep med for CPAP, may be cause of fatigue (rather than low T)   referred to dr Truddie Crumble  and following cpap    peptic ulcer  9/18 seen by gen surg/[facility], EGD and sono pending 11/17 pathology report from EGD and bx shows gastric ulcer, no neoplasm, H pylori negative 3/18 will  need to decide on f/u EGD with Dr. Pecola Leisure hx of Hiatal hernia    polyp of colon  had cscope 5/17, bx showed melanosis coli only, no colitis    prostate specific antigen abnormal  3/19 PSA 1.5 PSA rise 2016 to 2017 on testosterone, followed by urology/[facility], last seen 3/18    testicular hypofunction  5/18 followed by urology, they will follow PSA and testosterone levels prescribed gel 5/18 PSA 1.4 (slightly lower than last) followed by urology 5/18 PSA velocity increase  Pt needs refill on medication  lab needed    Vitamin D deficiency  On supplement      REVIEW OF SYSTEMS:   Review of Systems  General: No fever.  No chills.  No weight changes.  HEENT: chronic vision  Change   Cardiac: No chest pain. No palpitations.  No dizziness.  No light-headedness.  No near syncope.  Resp: No dyspnea at rest, no dyspnea on exertion; no cough or hemoptysis; no orthopnea or PND.  GI: No N/V. No melena.  No bright red blood per rectum.  E the cough xt: No edema.  No claudication. varicose veins  Wound vac in place on  Right foot  Neuro: No focal weakness.  No numbness.  All other ROS negative.      Objective: BP 112/60   Pulse 84   Temp 37 C (98.6 F)  Ht 1.854 m (6\' 1" )   Wt (!) 139 kg (306 lb)   SpO2 95%   BMI 40.37 kg/m              PHYSICAL EXAM  Physical Exam  Gen: NAD. Alert. obese  HEENT: PERRL; conjunctivae clear. No JVD or carotid bruit.  Cardiac: RRR with normal S1, S2.   Lungs: Clear to auscultation bilaterally. No rales. No wheezing. No rhonchi.  Abdomen: Soft, non-tender.non-distended  nl bowel sounds    Extremities: No edema. No cyanosis. No clubbing.  Neurologic:  Grossly intact  Wound vac in place area was  Not addressed  Pt is following with surgeon   Assessment/Plan  Assessment/Plan   1. Mixed hyperlipidemia    2. Type 2 diabetes mellitus without complication, unspecified whether long term insulin use (CMS HCC)    3. Hypomagnesemia    4. Stage 3a chronic kidney disease (CMS HCC)    5. Renal transplant  recipient    6. Anemia, unspecified type    7. Paroxysmal atrial fibrillation (CMS HCC)    8. Pressure injury of skin of ankle, unspecified injury stage, unspecified laterality    9. Hypoglycemia        Meds reviewed as well as labs.  Chart reviewed and updated.   Continue current treatment.  Keep follow-up appointment.   Vaccine hx reviewed.   Right foot wound vac in place    Labs and urine taken   No glp1  Due to hx of pancreatitis     aic has been stable  He wants to be  Off insulin if he can   35 and  45  Lantus will drop to 30 units and   40 units     Pt still on sliding scale but  aic  Was  5.9 %    Pt has monitor and occ low BS   70%  Time in range in  90 days and  17% low BS dicussed    Fu labs today   Continue to fu with surgeon   Recent note and pictures reviewed

## 2021-11-05 LAB — CBC WITH DIFF
BASOPHIL #: 0.1 10*3/uL (ref 0.00–0.30)
BASOPHIL %: 1 % (ref 0–3)
EOSINOPHIL #: 0.2 10*3/uL (ref 0.00–0.80)
EOSINOPHIL %: 4 % (ref 0–7)
HCT: 28.9 % — ABNORMAL LOW (ref 42.0–51.0)
HGB: 9.8 g/dL — ABNORMAL LOW (ref 13.5–18.0)
LYMPHOCYTE #: 1.6 10*3/uL (ref 1.10–5.00)
LYMPHOCYTE %: 28 % (ref 25–45)
MCH: 31.8 pg (ref 27.0–32.0)
MCHC: 34.1 g/dL (ref 32.0–36.0)
MCV: 93.3 fL (ref 78.0–99.0)
MONOCYTE #: 0.5 10*3/uL (ref 0.00–1.30)
MONOCYTE %: 10 % (ref 0–12)
MPV: 11.3 fL — ABNORMAL HIGH (ref 7.4–10.4)
NEUTROPHIL #: 3.3 10*3/uL (ref 1.80–8.40)
NEUTROPHIL %: 57 % (ref 40–76)
PLATELETS: 252 10*3/uL (ref 140–440)
RBC: 3.1 10*6/uL — ABNORMAL LOW (ref 4.20–6.00)
RDW: 14.4 % (ref 11.6–14.8)
WBC: 5.7 10*3/uL (ref 4.0–10.5)
WBCS UNCORRECTED: 5.7 10*3/uL

## 2021-11-05 LAB — MAGNESIUM: MAGNESIUM: 1.6 mg/dL — ABNORMAL LOW (ref 1.9–2.7)

## 2021-11-05 LAB — COMPREHENSIVE METABOLIC PNL, FASTING
ALBUMIN/GLOBULIN RATIO: 1.2 (ref 0.8–1.4)
ALBUMIN: 4 g/dL (ref 3.5–5.7)
ALKALINE PHOSPHATASE: 39 U/L (ref 34–104)
ALT (SGPT): 17 U/L (ref 7–52)
ANION GAP: 8 mmol/L — ABNORMAL LOW (ref 10–20)
AST (SGOT): 22 U/L (ref 13–39)
BILIRUBIN TOTAL: 0.3 mg/dL (ref 0.3–1.2)
BUN/CREA RATIO: 15 (ref 6–22)
BUN: 28 mg/dL — ABNORMAL HIGH (ref 7–25)
CALCIUM, CORRECTED: 9.4 mg/dL (ref 8.9–10.8)
CALCIUM: 9.4 mg/dL (ref 8.6–10.3)
CHLORIDE: 104 mmol/L (ref 98–107)
CO2 TOTAL: 24 mmol/L (ref 21–31)
CREATININE: 1.88 mg/dL — ABNORMAL HIGH (ref 0.60–1.30)
ESTIMATED GFR: 41 mL/min/{1.73_m2} — ABNORMAL LOW (ref >59)
GLOBULIN: 3.4 (ref 2.9–5.4)
GLUCOSE: 159 mg/dL — ABNORMAL HIGH (ref 74–109)
OSMOLALITY, CALCULATED: 281 mOsm/kg (ref 270–290)
POTASSIUM: 4.6 mmol/L (ref 3.5–5.1)
PROTEIN TOTAL: 7.4 g/dL (ref 6.4–8.9)
SODIUM: 136 mmol/L (ref 136–145)

## 2021-11-05 LAB — LIPID PANEL
CHOL/HDL RATIO: 4.1
CHOLESTEROL: 171 mg/dL (ref <200)
HDL CHOL: 42 mg/dL (ref 23–92)
LDL CALC: 101 mg/dL — ABNORMAL HIGH (ref 0–100)
TRIGLYCERIDES: 139 mg/dL (ref <=150)
VLDL CALC: 28 mg/dL (ref 0–50)

## 2021-11-05 LAB — IRON TRANSFERRIN AND TIBC
IRON (TRANSFERRIN) SATURATION: 18 % — ABNORMAL LOW (ref 20–50)
IRON: 78 ug/dL (ref 50–212)
TOTAL IRON BINDING CAPACITY: 423 ug/dL (ref 250–450)
TRANSFERRIN: 302 mg/dL (ref 203–362)
UIBC: 345 ug/dL (ref 130–375)

## 2021-11-05 LAB — THYROID STIMULATING HORMONE (SENSITIVE TSH): TSH: 2.672 u[IU]/mL (ref 0.450–5.330)

## 2021-11-06 LAB — HGA1C (HEMOGLOBIN A1C WITH EST AVG GLUCOSE): HEMOGLOBIN A1C: 5.1 % (ref 4.0–6.0)

## 2021-11-07 ENCOUNTER — Other Ambulatory Visit (INDEPENDENT_AMBULATORY_CARE_PROVIDER_SITE_OTHER): Payer: Self-pay | Admitting: Internal Medicine

## 2021-11-08 ENCOUNTER — Other Ambulatory Visit (INDEPENDENT_AMBULATORY_CARE_PROVIDER_SITE_OTHER): Payer: Self-pay | Admitting: Internal Medicine

## 2021-11-08 MED ORDER — IRON POLYSACCH CPLX 150 MG IRON-VIT B12 25 MCG-FOLIC ACID 1 MG CAPSULE
2.0000 | ORAL_CAPSULE | Freq: Every day | ORAL | 4 refills | Status: DC
Start: 2021-11-08 — End: 2022-04-30

## 2021-11-08 MED ORDER — MAGNESIUM OXIDE 400 MG (241.3 MG MAGNESIUM) TABLET
400.0000 mg | ORAL_TABLET | Freq: Two times a day (BID) | ORAL | 2 refills | Status: DC
Start: 2021-11-08 — End: 2022-06-18

## 2021-11-11 ENCOUNTER — Other Ambulatory Visit (INDEPENDENT_AMBULATORY_CARE_PROVIDER_SITE_OTHER): Payer: Self-pay | Admitting: Internal Medicine

## 2021-11-15 ENCOUNTER — Other Ambulatory Visit (INDEPENDENT_AMBULATORY_CARE_PROVIDER_SITE_OTHER): Payer: Self-pay | Admitting: Internal Medicine

## 2021-11-15 MED ORDER — ERGOCALCIFEROL (VITAMIN D2) 1,250 MCG (50,000 UNIT) CAPSULE
50000.0000 [IU] | ORAL_CAPSULE | ORAL | 3 refills | Status: DC
Start: 2021-11-15 — End: 2023-06-23

## 2021-11-15 MED ORDER — ATENOLOL 50 MG TABLET
50.0000 mg | ORAL_TABLET | Freq: Every day | ORAL | 3 refills | Status: DC
Start: 2021-11-15 — End: 2022-05-09

## 2021-11-15 MED ORDER — FENOFIBRATE 160 MG TABLET
160.0000 mg | ORAL_TABLET | Freq: Every day | ORAL | 3 refills | Status: DC
Start: 2021-11-15 — End: 2022-07-17

## 2021-11-16 MED ORDER — GABAPENTIN 800 MG TABLET
800.0000 mg | ORAL_TABLET | Freq: Three times a day (TID) | ORAL | 2 refills | Status: DC
Start: 2021-11-16 — End: 2022-03-19

## 2021-11-20 ENCOUNTER — Encounter (INDEPENDENT_AMBULATORY_CARE_PROVIDER_SITE_OTHER): Payer: Self-pay | Admitting: Surgery

## 2021-11-27 ENCOUNTER — Ambulatory Visit (INDEPENDENT_AMBULATORY_CARE_PROVIDER_SITE_OTHER): Payer: Medicare Other | Admitting: Surgery

## 2021-11-27 ENCOUNTER — Other Ambulatory Visit: Payer: Self-pay

## 2021-11-27 ENCOUNTER — Encounter (INDEPENDENT_AMBULATORY_CARE_PROVIDER_SITE_OTHER): Payer: Self-pay | Admitting: Surgery

## 2021-11-27 VITALS — BP 143/70 | HR 76 | Temp 98.2°F | Resp 18 | Ht 73.0 in | Wt 325.0 lb

## 2021-11-27 DIAGNOSIS — L97319 Non-pressure chronic ulcer of right ankle with unspecified severity: Secondary | ICD-10-CM

## 2021-11-27 NOTE — Nursing Note (Signed)
Wound vac removed from right heel/ankle, patients heel wound measures 9L x 2.5W, ankle 2L x 1.3W, leg wound on the right side measures 8L x 2W. Margit Hanks, LPN  5/94/5859 29:24

## 2021-11-28 ENCOUNTER — Encounter (INDEPENDENT_AMBULATORY_CARE_PROVIDER_SITE_OTHER): Payer: Self-pay | Admitting: Surgery

## 2021-11-28 NOTE — Progress Notes (Signed)
[facility]  GENERAL SURGERY, [facility] GENERAL SURGERY    Progress Note    Name: Martin Porter MRN:  [id number]   Date: 11/27/2021 Age: 56 y.o.          Date of Birth:  [birth year]  PCP: Samuella Cota, PA-C  Referring:  No ref. provider found     HPI:  Martin Porter is a 56 y.o. White male who returns for evaluation of the ulceration on the posterior aspect of his right heel.  He has no new complaints.  Has been using the wound VAC without difficulty.  Hoping to have it removed at this time.  Has had skin tear on the anterior surface of the leg from the occlusive dressing from the [place] dressing.        Past Medical History:   Diagnosis Date    Abnormal prostate specific antigen     Anemia, unspecified     Atrial fibrillation (CMS HCC)     Candida infection     CKD (chronic kidney disease) stage 3, GFR 30-59 ml/min (CMS HCC)     Cyst of kidney, acquired     Diabetic peripheral neuropathy (CMS HCC)     GERD (gastroesophageal reflux disease)     Herpes simplex     Hiatal hernia     Hypogonadism in male     Hypomagnesemia     Impotence     Legal blindness     Mixed hyperlipidemia     Obesity, unspecified     12/18 pt would like referral for bariatric surgery, would like to be seen by [facility] because of his hx of transplant    Obstructive sleep apnea syndrome     4/18 compliant with CPAP, using nasal PILLOW MILD OSA. 12/17 MILD, WILL FOLLOW UP WITH SLEEP MED FOR CPAP MAY  BE BECAUSE FATIGUE    Peptic ulcer     9/18 seen by gen surg/[facility], EGD and sono pending gastric ulcer, no neoplasm, H pylori negative 3/18 will need to decide on f/u EGD with Dr. Despina Arias    Polyp of colon     Renal transplant recipient     Testicular hypofunction     5/18 followed by urology, they will follow PSA and testosterone levels prescribed gel 5/18 PSA 1.4 (slightly lower than last) followed by urology 5/18 PSA velocity increased form 2016-2017, testosterone held, was to have repeat PSA/VitD and T levels in our clinic and CC  to Dr. Ricki Miller Right testicular hypotrophy    Tobacco dependence syndrome     Type 2 diabetes mellitus without complications (CMS HCC)     Unspecified glaucoma(365.9)     Vitamin D deficiency       Past Surgical History:   Procedure Laterality Date    COLONOSCOPY      DEBRIDEMENT  FOOT Right     EYE SURGERY      FOOT SURGERY      HX HERNIA REPAIR      HX RENAL TRANSPLANT      1/20 pt has fu Feb 5th 12/19 Cr 1.69 GFR 45 6/19 Cr 1.41 GFR 56 3/19 Cr 1.67 GFR 46 11/18 seen by Dr. Clyde Canterbury, renal fx worsening, no comment on cause 11/18 Cr 1.65 GFR 478/18 Cr 1.95 GFR 38 6/18 Cr 1.5, Ca mildly high, SPE P/UPEPpending and asap f/u with nephrology 5/18 back to baseline cr 1.2 and GFR 68 CKD GFR 59 5/18 and was 59 11/17. Last Creat 1.36 increased from  1.2 11/17.    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 It seems that pt underwent  pancreas tp to "cure" DM/islet cells  a few years ago pt seen by endo and given OHG had pancreatitis, thought due to the DM medicines    SHOULDER SURGERY        Outpatient Medications Marked as Taking for the 11/27/21 encounter (Office Visit) with Shirlee More, MD   Medication Sig    amLODIPine (NORVASC) 5 mg Oral Tablet TAKE ONE TABLET BY MOUTH TWICE A DAY    aspirin (ECOTRIN) 81 mg Oral Tablet, Delayed Release (E.C.) Take 1 Tablet (81 mg total) by mouth    atenoloL (TENORMIN) 50 mg Oral Tablet TAKE ONE TABLET BY MOUTH TWICE A DAY    atenoloL (TENORMIN) 50 mg Oral Tablet Take 1 Tablet (50 mg total) by mouth Once a day    atropine (ISOPTO ATROPINE) 1 % Ophthalmic Drops 1 Drop Four times a day    carvediloL (COREG) 25 mg Oral Tablet Take 1 Tablet (25 mg total) by mouth Twice daily with food    cholecalciferol, vitamin D3, 1,250 mcg (50,000 unit) Oral Capsule TAKE 1 CAPSULE ONCE A WEEEK    cyanocobalamin  (VITAMIN B 12) 1,000 mcg Oral Tablet Take 2 Tablets (2,000 mcg total) by mouth    docusate sodium (COLACE) 100 mg Oral Capsule Take 1 Capsule (100 mg total) by mouth    dorzolamide-timoloL (COSOPT) 22.3-6.8 mg/mL Ophthalmic Drops Administer 1 Drop into the left eye Twice daily    ergocalciferol, vitamin D2, (DRISDOL) 1,250 mcg (50,000 unit) Oral Capsule Take 1 Capsule (50,000 Units total) by mouth Every 7 days    famotidine (PEPCID) 40 mg Oral Tablet Take 1 Tablet (40 mg total) by mouth Twice daily    fenofibrate (LOFIBRA) 160 mg Oral Tablet TAKE ONE TABLET BY MOUTH EVERY DAY    fenofibrate (LOFIBRA) 160 mg Oral Tablet Take 1 Tablet (160 mg total) by mouth Once a day    gabapentin (NEURONTIN) 800 mg Oral Tablet TAKE ONE TABLET BY MOUTH THREE TIMES A DAY    gabapentin (NEURONTIN) 800 mg Oral Tablet Take 1 Tablet (800 mg total) by mouth Three times a day    hydroCHLOROthiazide (HYDRODIURIL) 25 mg Oral Tablet Take 1 Tablet (25 mg total) by mouth Once a day    insulin aspart U-100 (NOVOLOG) 100 unit/mL (3 mL) Subcutaneous Insulin Pen Inject 25 Units under the skin Twice a day before meals 25 units every morning  15 units every evening    insulin glargine 100 unit/mL Subcutaneous injection Inject 35 Units under the skin Every night    iron ps complex-B12-folic acid (FERREX FORTE) 150-25-1 mg-mcg-mg Oral Capsule Take 2 Capsules by mouth Once a day    L. acidophilus/L. bifidus (LACTOBACILLUS ACIDOPH-L. BIFID ORAL) Take 2 Tablets by mouth    magnesium oxide (MAG-OX) 400 mg Oral Tablet Take 1 Tablet (400 mg total) by mouth Twice daily    mycophenolate sodium (MYFORTIC) 180 mg Oral Tablet, Delayed Release (E.C.) Take 3 Tablets (540 mg total) by mouth Twice daily    nystatin (MYCOSTATIN) 100,000 unit/gram Cream Apply topically Twice daily    ondansetron (ZOFRAN) 4 mg Oral Tablet Take 1 Tablet (4 mg total)  by mouth Every 8 hours as needed for Nausea/Vomiting    pancreatic enzyme replacement (CREON) 12,000-38,000 -60,000 unit  Oral Capsule, Delayed Release(E.C.) TAKE FOUR CAPSULES BY MOUTH TWICE A DAY    pantoprazole (PROTONIX) 40 mg Oral Tablet, Delayed Release (E.C.) TAKE ONE TABLET BY MOUTH TWICE A DAY    potassium citrate (UROCIT-K) 10 mEq (1,080 mg) Oral Tablet Sustained Release Take 1 Tablet (10 mEq total) by mouth Twice daily with food    pravastatin (PRAVACHOL) 10 mg Oral Tablet TAKE ONE TABLET BY MOUTH EVERY DAY    pravastatin (PRAVACHOL) 40 mg Oral Tablet Take 1 Tablet (40 mg total) by mouth Every evening    prednisoLONE acetate (PRED FORTE) 1 % Ophthalmic Drops, Suspension Instill 1 Drop into both eyes Twice daily    promethazine-dextromethorphan (PHENERGAN-DM) 6.25-15 mg/5 mL Oral Syrup Take 5 mL by mouth Four times a day as needed for Cough    tacrolimus (PROGRAF) 1 mg Oral Capsule Take 3 Capsules (3 mg total) by mouth Twice daily    testosterone (ANDROGEL) 20.25 mg/1.25 gram (1.62 %) Transdermal Gel in Metered-dose Pump Place 20.25 mg on the skin Once a day    trimethoprim-sulfamethoxazole (BACTRIM DS) 160-800mg  per tablet Take 1 Tablet (160 mg total) by mouth Twice daily    trimethoprim-sulfamethoxazole (BACTRIM) 80-400mg  per tablet TAKE 1 TABLET 3 TIMES A WEEK    TRUEPLUS PEN NEEDLE 31 gauge x 3/16" Does not apply Needle USE 5 TIMES A DAY      No Known Allergies        BP (!) 143/70   Pulse 76   Temp 36.8 C (98.2 F)   Resp 18   Ht 1.854 m (6\' 1" )   Wt (!) 147 kg (325 lb)   SpO2 96%   BMI 42.88 kg/m          General: appropriate for age. in no acute distress.    Vital signs are present above and have been reviewed by me     HEENT: Atraumatic, Normocephalic.    Lungs: Nonlabored breathing with symmetric expansion    Heart:Regular wth respect to rate and rythmn.    Abdomen:Soft. Nontender. Nondistended     Psychiatric: Alert and oriented to person, place, and time. affect appropriate     Extremity:  VAC dressing removed.  Superficial areas of ulcerative changes on the posterior aspect of the heel.  Much improved  with decreased area from prior visualizations.  Some induration of the skin from the occlusive dressing          Assessment/Plan:  Assessment/Plan   1. Non-healing ulcer of right ankle, unspecified ulcer stage (CMS HCC)         Feel that the areas are superficial enough that they do not require continued use of the wound VAC.  Will transfer back to Silvadene dressings.  No plans for any other operative intervention at the current time.  Continues to improve.      This note was partially created using voice recognition software and is inherently subject to errors including those of syntax and "sound alike " substitutions which may escape proof reading. In such instances, original meaning may be extrapolated by contextual derivation.    MD MBA CPE FACS

## 2021-12-03 ENCOUNTER — Other Ambulatory Visit (INDEPENDENT_AMBULATORY_CARE_PROVIDER_SITE_OTHER): Payer: Self-pay | Admitting: Internal Medicine

## 2021-12-18 ENCOUNTER — Encounter (INDEPENDENT_AMBULATORY_CARE_PROVIDER_SITE_OTHER): Payer: Self-pay | Admitting: Surgery

## 2021-12-18 ENCOUNTER — Other Ambulatory Visit: Payer: Self-pay

## 2021-12-18 ENCOUNTER — Ambulatory Visit (INDEPENDENT_AMBULATORY_CARE_PROVIDER_SITE_OTHER): Payer: Medicare Other | Admitting: Surgery

## 2021-12-18 DIAGNOSIS — L97319 Non-pressure chronic ulcer of right ankle with unspecified severity: Secondary | ICD-10-CM

## 2021-12-18 NOTE — Nursing Note (Signed)
Area to ankle 9cm x 2cm. Dressing applied. Home health to come to today to apply silvadene and dressing. Order to continue daily dressings faxed to [facility].   Meriam Sprague, LPN  10/29/9483 46:27

## 2021-12-18 NOTE — Progress Notes (Signed)
Martin Martin Porter    Progress Note    Name: Martin Martin Porter MRN:  [id number]   Date: 12/18/2021 Age: 56 y.o.          Date of Birth:  05-16-1965  PCP: Martin Meigs, PA-C  Referring:  Martin Martin Porter     HPI:  Martin Martin Porter is a 56 y.o. White male who returns for follow-up of his right ankle wound.  Has had daily home health dressing changes with [facility].  No complaints at this time.  No drainage.  No pain.  Is able to ambulate without limitations          Past Medical History:   Diagnosis Date    Abnormal prostate specific antigen     Anemia, unspecified     Atrial fibrillation (CMS HCC)     Candida infection     CKD (chronic kidney disease) stage 3, GFR 30-59 ml/min (CMS HCC)     Cyst of kidney, acquired     Diabetic peripheral neuropathy (CMS HCC)     GERD (gastroesophageal reflux disease)     Herpes simplex     Hiatal hernia     Hypogonadism in male     Hypomagnesemia     Impotence     Legal blindness     Mixed hyperlipidemia     Obesity, unspecified     12/18 pt would like referral for bariatric Martin Porter, would like to be seen by Martin Martin Porter because of his hx of transplant    Obstructive sleep apnea syndrome     4/18 compliant with CPAP, using nasal PILLOW MILD OSA. 12/17 MILD, WILL FOLLOW UP WITH SLEEP MED FOR CPAP MAY  BE BECAUSE FATIGUE    Peptic ulcer     9/18 seen by gen surg/Martin Porter, EGD and sono pending gastric ulcer, no neoplasm, H pylori negative 3/18 will need to decide on f/u EGD with Dr. Waynette Martin Porter    Polyp of colon     Renal transplant recipient     Testicular hypofunction     5/18 followed by urology, they will follow PSA and testosterone levels prescribed gel 5/18 PSA 1.4 (slightly lower than last) followed by urology 5/18 PSA velocity increased form 2016-2017, testosterone held, was to have repeat PSA/VitD and T levels in our clinic and CC to Martin Martin Porter Right testicular hypotrophy    Tobacco dependence syndrome     Type 2 diabetes mellitus without  complications (CMS Martin Martin Porter)     Unspecified glaucoma(365.9)     Vitamin D deficiency       Past Surgical History:   Procedure Laterality Date    COLONOSCOPY      DEBRIDEMENT  FOOT Right     EYE Martin Porter      FOOT Martin Porter      HX HERNIA REPAIR      HX RENAL TRANSPLANT      1/20 pt has fu Feb 5th 12/19 Cr 1.69 GFR 45 6/19 Cr 1.41 GFR 56 3/19 Cr 1.67 GFR 46 11/18 seen by Dr. Marcy Martin Porter, renal fx worsening, no comment on cause 11/18 Cr 1.65 GFR 478/18 Cr 1.95 GFR 38 6/18 Cr 1.5, Ca mildly high, SPE P/UPEPpending and asap f/u with nephrology 5/18 back to baseline cr 1.2 and GFR 68 CKD GFR 59 5/18 and was 59 11/17. Last Creat 1.36 increased from 1.2 11/17.    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 It seems that pt underwent  pancreas tp to "cure" DM/islet cells  a few years ago pt seen by endo and given OHG had pancreatitis, thought due to the DM medicines    SHOULDER Martin Porter        Outpatient Medications Marked as Taking for the 12/18/21 encounter (Office Visit) with Martin Plants, MD   Medication Sig    amLODIPine (NORVASC) 5 mg Oral Tablet TAKE ONE TABLET BY MOUTH TWICE A DAY    aspirin (ECOTRIN) 81 mg Oral Tablet, Delayed Release (E.C.) Take 1 Tablet (81 mg total) by mouth    atenoloL (TENORMIN) 50 mg Oral Tablet TAKE ONE TABLET BY MOUTH TWICE A DAY    atenoloL (TENORMIN) 50 mg Oral Tablet Take 1 Tablet (50 mg total) by mouth Once a day    atropine (ISOPTO ATROPINE) 1 % Ophthalmic Drops 1 Drop Four times a day    carvediloL (COREG) 25 mg Oral Tablet Take 1 Tablet (25 mg total) by mouth Twice daily with food    cholecalciferol, vitamin D3, 1,250 mcg (50,000 unit) Oral Capsule TAKE 1 CAPSULE ONCE A WEEEK    cyanocobalamin (VITAMIN B 12) 1,000 mcg Oral Tablet Take 2 Tablets (2,000 mcg total) by mouth    docusate sodium (COLACE) 100 mg  Oral Capsule Take 1 Capsule (100 mg total) by mouth    dorzolamide-timoloL (COSOPT) 22.3-6.8 mg/mL Ophthalmic Drops Administer 1 Drop into the left eye Twice daily    ergocalciferol, vitamin D2, (DRISDOL) 1,250 mcg (50,000 unit) Oral Capsule Take 1 Capsule (50,000 Units total) by mouth Every 7 days    famotidine (PEPCID) 40 mg Oral Tablet Take 1 Tablet (40 mg total) by mouth Twice daily    fenofibrate (LOFIBRA) 160 mg Oral Tablet TAKE ONE TABLET BY MOUTH EVERY DAY    fenofibrate (LOFIBRA) 160 mg Oral Tablet Take 1 Tablet (160 mg total) by mouth Once a day    gabapentin (NEURONTIN) 800 mg Oral Tablet TAKE ONE TABLET BY MOUTH THREE TIMES A DAY    gabapentin (NEURONTIN) 800 mg Oral Tablet Take 1 Tablet (800 mg total) by mouth Three times a day    hydroCHLOROthiazide (HYDRODIURIL) 25 mg Oral Tablet Take 1 Tablet (25 mg total) by mouth Once a day    insulin aspart U-100 (NOVOLOG) 100 unit/mL (3 mL) Subcutaneous Insulin Pen Inject 25 Units under the skin Twice a day before meals 25 units every morning  15 units every evening    insulin glargine 100 unit/mL Subcutaneous injection Inject 35 Units under the skin Every night    iron ps complex-B12-folic acid (FERREX FORTE) 150-25-1 mg-mcg-mg Oral Capsule Take 2 Capsules by mouth Once a day    L. acidophilus/L. bifidus (LACTOBACILLUS ACIDOPH-L. BIFID ORAL) Take 2 Tablets by mouth    magnesium oxide (MAG-OX) 400 mg Oral Tablet Take 1 Tablet (400 mg total) by mouth Twice daily    mycophenolate sodium (MYFORTIC) 180 mg Oral Tablet, Delayed Release (E.C.) Take 3 Tablets (540 mg total) by mouth Twice daily    nystatin (MYCOSTATIN) 100,000 unit/gram Cream Apply topically Twice daily    ondansetron (ZOFRAN) 4 mg Oral Tablet Take 1 Tablet (4 mg total) by mouth Every 8 hours as needed for Nausea/Vomiting    pancreatic enzyme replacement (CREON) 12,000-38,000 -60,000 unit Oral  Capsule, Delayed Release(E.C.) TAKE FOUR CAPSULES BY MOUTH TWICE A DAY    pantoprazole (PROTONIX) 40 mg Oral  Tablet, Delayed Release (E.C.) TAKE ONE TABLET BY MOUTH TWICE A DAY    potassium citrate (UROCIT-K) 10 mEq (1,080 mg) Oral Tablet Sustained Release Take 1 Tablet (10 mEq total) by mouth Twice daily with food    pravastatin (PRAVACHOL) 10 mg Oral Tablet TAKE ONE TABLET BY MOUTH EVERY DAY    pravastatin (PRAVACHOL) 40 mg Oral Tablet Take 1 Tablet (40 mg total) by mouth Every evening    prednisoLONE acetate (PRED FORTE) 1 % Ophthalmic Drops, Suspension Instill 1 Drop into both eyes Twice daily    promethazine-dextromethorphan (PHENERGAN-DM) 6.25-15 mg/5 mL Oral Syrup Take 5 mL by mouth Four times a day as needed for Cough    tacrolimus (PROGRAF) 1 mg Oral Capsule Take 3 Capsules (3 mg total) by mouth Twice daily    testosterone (ANDROGEL) 20.25 mg/1.25 gram (1.62 %) Transdermal Gel in Metered-dose Pump Place 20.25 mg on the skin Once a day    trimethoprim-sulfamethoxazole (BACTRIM DS) 160-800mg  per tablet Take 1 Tablet (160 mg total) by mouth Twice daily    trimethoprim-sulfamethoxazole (BACTRIM) 80-400mg  per tablet TAKE 1 TABLET 3 TIMES A WEEK    TRUEPLUS PEN NEEDLE 31 gauge x 3/16" Does not apply Needle USE 5 TIMES A DAY      No Known Allergies        BP (!) (P) 153/77 (Site: Right, Patient Position: Sitting)   Pulse (P) 78   Temp (P) 36.4 C (97.6 F)   Wt (!) (P) 148 kg (325 lb 6.4 oz)   SpO2 (P) 93%   BMI (P) 42.93 kg/m          General: appropriate for age. in no acute distress.    Vital signs are present above and have been reviewed by me     HEENT: Atraumatic, Normocephalic.    Lungs: Nonlabored breathing with symmetric expansion    Heart:Regular wth respect to rate and rythmn.    Abdomen:Soft. Nontender. Nondistended     Right lower extremity posterior heel ulceration is dramatically improved.  Very superficial.  Good granulation tissue.  Decreased area.  Almost no surrounding induration.      Psychiatric: Alert and oriented to person, place, and time. affect appropriate        Assessment/Plan:  Assessment/Plan   1. Non-healing ulcer of right ankle, unspecified ulcer stage (CMS HCC)         Continue current wound care.  Much improved.  No plans for any other intervention at this time.  Recheck 3 weeks.      This note was partially created using voice recognition software and is inherently subject to errors including those of syntax and "sound alike " substitutions which may escape proof reading. In such instances, original meaning may be extrapolated by contextual derivation.    Maura Crandall MD MBA CPE FACS

## 2021-12-19 ENCOUNTER — Other Ambulatory Visit (RURAL_HEALTH_CENTER): Payer: Self-pay | Admitting: Internal Medicine

## 2021-12-19 ENCOUNTER — Other Ambulatory Visit (INDEPENDENT_AMBULATORY_CARE_PROVIDER_SITE_OTHER): Payer: Self-pay | Admitting: Surgery

## 2021-12-26 ENCOUNTER — Telehealth (INDEPENDENT_AMBULATORY_CARE_PROVIDER_SITE_OTHER): Payer: Self-pay | Admitting: Surgery

## 2021-12-26 NOTE — Telephone Encounter (Signed)
Cyndia Skeeters from [facility] ([facility]) home health called stating patients insurance is not going to cover for them to go daily for wound care, she would like orders to go twice a week, okay to give? Please advise. Margit Hanks, LPN  1/57/2620 35:59    [phone])

## 2021-12-27 ENCOUNTER — Ambulatory Visit: Payer: Medicare Other | Attending: Internal Medicine | Admitting: Internal Medicine

## 2021-12-27 ENCOUNTER — Encounter (INDEPENDENT_AMBULATORY_CARE_PROVIDER_SITE_OTHER): Payer: Self-pay | Admitting: Internal Medicine

## 2021-12-27 ENCOUNTER — Other Ambulatory Visit: Payer: Self-pay

## 2021-12-27 VITALS — BP 132/78 | HR 94 | Temp 98.0°F | Wt 327.0 lb

## 2021-12-27 DIAGNOSIS — E1142 Type 2 diabetes mellitus with diabetic polyneuropathy: Secondary | ICD-10-CM | POA: Insufficient documentation

## 2021-12-27 DIAGNOSIS — I480 Paroxysmal atrial fibrillation: Secondary | ICD-10-CM | POA: Insufficient documentation

## 2021-12-27 DIAGNOSIS — E782 Mixed hyperlipidemia: Secondary | ICD-10-CM | POA: Insufficient documentation

## 2021-12-27 DIAGNOSIS — Z940 Kidney transplant status: Secondary | ICD-10-CM | POA: Insufficient documentation

## 2021-12-27 DIAGNOSIS — E119 Type 2 diabetes mellitus without complications: Secondary | ICD-10-CM | POA: Insufficient documentation

## 2021-12-27 NOTE — Telephone Encounter (Signed)
Spoke with Cyndia Skeeters from [facility] who stated they will only be going twice daily due to insurance, but they can also go as needed if patient calls and states he needs help. Ma Rings, LPN  01/03/5175 16:07

## 2021-12-27 NOTE — Progress Notes (Unsigned)
Name: Martin Porter                       Date of Birth: [birth year]   MRN:  [id number]                         Date of visit: 12/27/2021     PCP: Delaney Meigs, PA-C     Subjective  Martin Porter is a 56 y.o. year old male who presents for Wound Check (4 week)   to clinic.  No specialty comments available.   Patient Active Problem List    Diagnosis Date Noted   . Cellulitis 09/05/2021   . Hypomagnesemia 07/15/2021   . Testicular hypofunction 07/15/2021   . Type 2 diabetes mellitus without complications (CMS HCC) 123456   . CKD (chronic kidney disease) stage 3, GFR 30-59 ml/min (CMS HCC) 07/15/2021   . Anemia, unspecified 07/15/2021   . Mixed hyperlipidemia 07/15/2021   . Atrial fibrillation (CMS HCC) 07/15/2021   . Diabetic peripheral neuropathy (CMS HCC) 07/15/2021   . GERD (gastroesophageal reflux disease) 07/15/2021   . Renal transplant recipient 07/15/2021   . Impotence 07/15/2021   . Hypogonadism in male 07/15/2021   . Obstructive sleep apnea syndrome 07/15/2021     4/18 compliant with CPAP, using nasal PILLOW MILD OSA. 12/17 MILD, WILL FOLLOW UP WITH SLEEP MED FOR CPAP MAY  BE BECAUSE FATIGUE     . Candida infection 07/15/2021      Current Outpatient Medications   Medication Sig   . amLODIPine (NORVASC) 5 mg Oral Tablet TAKE ONE TABLET BY MOUTH TWICE A DAY   . aspirin (ECOTRIN) 81 mg Oral Tablet, Delayed Release (E.C.) Take 1 Tablet (81 mg total) by mouth   . atenoloL (TENORMIN) 50 mg Oral Tablet TAKE ONE TABLET BY MOUTH TWICE A DAY   . atenoloL (TENORMIN) 50 mg Oral Tablet Take 1 Tablet (50 mg total) by mouth Once a day   . atropine (ISOPTO ATROPINE) 1 % Ophthalmic Drops 1 Drop Four times a day   . carvediloL (COREG) 25 mg Oral Tablet Take 1 Tablet (25 mg total) by mouth Twice daily with food   . cholecalciferol, vitamin D3, 1,250 mcg (50,000 unit) Oral Capsule TAKE 1 CAPSULE ONCE A WEEEK   . cyanocobalamin  (VITAMIN B 12) 1,000 mcg Oral Tablet Take 2 Tablets (2,000 mcg total) by mouth   . docusate sodium (COLACE) 100 mg Oral Capsule Take 1 Capsule (100 mg total) by mouth   . dorzolamide-timoloL (COSOPT) 22.3-6.8 mg/mL Ophthalmic Drops Administer 1 Drop into the left eye Twice daily   . ergocalciferol, vitamin D2, (DRISDOL) 1,250 mcg (50,000 unit) Oral Capsule Take 1 Capsule (50,000 Units total) by mouth Every 7 days   . famotidine (PEPCID) 40 mg Oral Tablet Take 1 Tablet (40 mg total) by mouth Twice daily   . fenofibrate (LOFIBRA) 160 mg Oral Tablet TAKE ONE TABLET BY MOUTH EVERY DAY   . fenofibrate (LOFIBRA) 160 mg Oral Tablet Take 1 Tablet (160 mg total) by mouth Once a day   . gabapentin (NEURONTIN) 800 mg Oral Tablet TAKE ONE TABLET BY MOUTH THREE TIMES A DAY   . gabapentin (NEURONTIN) 800 mg Oral Tablet Take 1 Tablet (800 mg total) by mouth Three times a day   . hydroCHLOROthiazide (HYDRODIURIL) 25 mg Oral Tablet TAKE ONE TABLET BY MOUTH EVERY DAY   . insulin aspart U-100 (NOVOLOG) 100 unit/mL (3  mL) Subcutaneous Insulin Pen Inject 25 Units under the skin Twice a day before meals 25 units every morning  15 units every evening   . insulin glargine 100 unit/mL Subcutaneous injection Inject 45 Units under the skin Every morning with breakfast for 30 days   . insulin glargine 100 unit/mL Subcutaneous injection Inject 35 Units under the skin Every night   . iron ps complex-B12-folic acid (FERREX FORTE) 150-25-1 mg-mcg-mg Oral Capsule Take 2 Capsules by mouth Once a day   . L. acidophilus/L. bifidus (LACTOBACILLUS ACIDOPH-L. BIFID ORAL) Take 2 Tablets by mouth   . magnesium oxide (MAG-OX) 400 mg Oral Tablet Take 1 Tablet (400 mg total) by mouth Twice daily   . mycophenolate sodium (MYFORTIC) 180 mg Oral Tablet, Delayed Release (E.C.) Take 3 Tablets (540 mg total) by mouth Twice daily   . nystatin (MYCOSTATIN) 100,000 unit/gram Cream Apply topically Twice daily   . ondansetron (ZOFRAN) 4 mg Oral Tablet Take 1 Tablet (4  mg total) by mouth Every 8 hours as needed for Nausea/Vomiting   . pancreatic enzyme replacement (CREON) 12,000-38,000 -60,000 unit Oral Capsule, Delayed Release(E.C.) TAKE FOUR CAPSULES BY MOUTH TWICE A DAY   . pantoprazole (PROTONIX) 40 mg Oral Tablet, Delayed Release (E.C.) TAKE ONE TABLET BY MOUTH TWICE A DAY   . potassium citrate (UROCIT-K) 10 mEq (1,080 mg) Oral Tablet Sustained Release Take 1 Tablet (10 mEq total) by mouth Twice daily with food   . pravastatin (PRAVACHOL) 10 mg Oral Tablet TAKE ONE TABLET BY MOUTH EVERY DAY   . pravastatin (PRAVACHOL) 40 mg Oral Tablet Take 1 Tablet (40 mg total) by mouth Every evening   . prednisoLONE acetate (PRED FORTE) 1 % Ophthalmic Drops, Suspension Instill 1 Drop into both eyes Twice daily   . promethazine-dextromethorphan (PHENERGAN-DM) 6.25-15 mg/5 mL Oral Syrup Take 5 mL by mouth Four times a day as needed for Cough   . SSD 1 % Cream APPLY AS DIRECTED ONCE A DAY TO HEEL AND ANKLE   . tacrolimus (PROGRAF) 1 mg Oral Capsule Take 3 Capsules (3 mg total) by mouth Twice daily   . testosterone (ANDROGEL) 20.25 mg/1.25 gram (1.62 %) Transdermal Gel in Metered-dose Pump Place 20.25 mg on the skin Once a day   . trimethoprim-sulfamethoxazole (BACTRIM DS) 160-800mg  per tablet Take 1 Tablet (160 mg total) by mouth Twice daily   . trimethoprim-sulfamethoxazole (BACTRIM) 80-400mg  per tablet TAKE 1 TABLET 3 TIMES A WEEK   . TRUEPLUS PEN NEEDLE 31 gauge x 3/16" Does not apply Needle USE 5 TIMES A DAY        Chronic Disease Management-AMB  Fu visit    This pt is for fu today of chronic problems  Pt is s/p hospitalization and continues to have treatment to ulcer on ankle and heel   Pt has wound vac and following with surgeon     No new problems per pt however pt has been having low BS    Pt and I discussed low BS and  Adjusting insulin Last aic was 5.9%    fu ckd 3 sees dr Marcy Panning fu 1 year fu  hx of transplant dec  2005   and [place]  Appts utd      needs fu with dr Marcy Panning     abdominal pain  9/18 EGD Dr. Karenann Cai, antral ulcer 2016 nl HIDA 2017 nl abd ultrasound x for FLD    Anemia  fu labs     atrial fibrillation  pt  reports that took coumadin after transplant and recalls 2 yrs of heparin before hand when on dialysis. Pt thinks took coumadin for 9-12 mo after transplant but pt does not recal    cyst of kidney  1/20 pt contacted transplat team, they are not concerned about the 8 cm cyst at the time, pt scheduled for fu for Feb 5th 1/20 8 cm seen on transplanted kidney, sending to transplant team, need to verify that this is not worrisome    diabetes mellitus type 2  Pt has been in  [place] control but  Too much control  Pt's Aic 5.9%  Pt and I discussed how detrimental low BS can be   Insulin needs to be adjusted   Pt brings up medication oral agents he has been on in the past and he would like to be off insulin and oral agents only   However as he has had a renal transplant and can not take medications he was on in the past       diabetic peripheral neuropathy    essential hypertension bp stable here and at home    gastroesophageal reflux disease currently stable    glaucoma  2020 pt has fu at [facility] severe Right, followed at [facility], last visit 5/17 sees every 11 mo.    Hx of his Herpes simplex    history of renal transplant left  dec  2005 [facility]   1/20 pt has fu Feb 5th 12/19 Cr 1.69 GFR 45 6/19 Cr 1.41 GFR 56 3/19 Cr 1.67 GFR 46 11/18 seen by Dr. Clyde Canterbury, renal fx worsening, no comment on cause 11/18 Cr 1.65 GFR 47    history of transplantation of pancreas  12/19 Tac level nl 8.4 amylase and lipse nl PK txp 2005, pt reports had renal failure and was on HD, qualified for ki tp. 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    hyperlipidemia  Stable on fenofibrate and statin     legal blindness    obesity  12/18 pt would like referral for bariatric surgery, would like to be seen by [facility] because of his hx  of transplant    obstructive sleep apnea syndrome  4/18 compliant with CPAP, using nasal pillow, MILD OSA 12/17 mild, will f/u with sleep med for CPAP, may be cause of fatigue (rather than low T)   referred to dr Truddie Crumble  and following cpap    peptic ulcer  9/18 seen by gen surg/[facility], EGD and sono pending 11/17 pathology report from EGD and bx shows gastric ulcer, no neoplasm, H pylori negative 3/18 will need to decide on f/u EGD with Dr. Pecola Leisure hx of Hiatal hernia    polyp of colon  had cscope 5/17, bx showed melanosis coli only, no colitis    prostate specific antigen abnormal  3/19 PSA 1.5 PSA rise 2016 to 2017 on testosterone, followed by urology/[place], last seen 3/18    testicular hypofunction  5/18 followed by urology, they will follow PSA and testosterone levels prescribed gel 5/18 PSA 1.4 (slightly lower than last) followed by urology 5/18 PSA velocity increase  Pt needs refill on medication  lab needed    Vitamin D deficiency  On supplement          REVIEW OF SYSTEMS:   Review of Systems  General: No fever.  No chills.  No weight changes.  HEENT: No vision changes.  Cardiac: No chest pain. No palpitations.  No dizziness.  No light-headedness.  No near syncope.  Resp: No dyspnea at rest, no dyspnea on exertion; no cough or hemoptysis; no orthopnea or PND.  GI: No N/V. No melena.  No bright red blood per rectum.  Ext: No edema.  No claudication.  Neuro: No focal weakness.  No numbness.  All other ROS negative.      Objective:   BP 132/78   Pulse 94   Temp 36.7 C (98 F)   Wt (!) 148 kg (327 lb)   BMI 43.14 kg/m              PHYSICAL EXAM  Physical Exam  Gen: NAD. Alert. obese  HEENT: PERRL; conjunctivae clear. No JVD or carotid bruit.  Cardiac: RRR with normal S1, S2.   Lungs: Clear to auscultation bilaterally. No rales. No wheezing. No rhonchi.  Abdomen: Soft, non-tender.non-distended  nl bowel sounds    Extremities: + edema. Bilateral  right leg wrapped   Assessment/Plan  Assessment/Plan   1.  Mixed hyperlipidemia    2. Hypomagnesemia    3. Type 2 diabetes mellitus without complication, unspecified whether long term insulin use (CMS HCC)    4. Paroxysmal atrial fibrillation (CMS HCC)    5. Diabetic peripheral neuropathy (CMS HCC)    6. Renal transplant recipient        Meds reviewed as well as labs.  Chart reviewed and updated.   Continue current treatment.  Keep follow-up appointment.   Vaccine hx reviewed.   Wound to follow  with surgeon    Notes reviewed and wound on pictures looking great   Labs from July  2023  and stable       BS  stable

## 2022-01-02 ENCOUNTER — Telehealth (INDEPENDENT_AMBULATORY_CARE_PROVIDER_SITE_OTHER): Payer: Self-pay | Admitting: Surgery

## 2022-01-02 NOTE — Telephone Encounter (Signed)
Emilee, nurse from home health,  called in regards to dressings on ankle and heel. Wants to know if dressing on ankle can be changed to optifoam and border gauze to heel twice weekly? Says wound is healing well. Patient complaining that other dressing is bulky.   Meriam Sprague, LPN  8/41/3244 01:02

## 2022-01-03 NOTE — Telephone Encounter (Signed)
Emilee from home health notified. Updated on dressing change. Voiced understanding.   Meriam Sprague, LPN  10/08/2945 65:46

## 2022-01-03 NOTE — Telephone Encounter (Signed)
Attempted to notify nurse of dressing change. No answer. Message left with request for her to return call.   Meriam Sprague, LPN  05/11/3843 36:46

## 2022-01-07 ENCOUNTER — Other Ambulatory Visit (RURAL_HEALTH_CENTER): Payer: Self-pay | Admitting: Internal Medicine

## 2022-01-08 ENCOUNTER — Encounter (INDEPENDENT_AMBULATORY_CARE_PROVIDER_SITE_OTHER): Payer: Self-pay | Admitting: Surgery

## 2022-01-13 ENCOUNTER — Encounter (INDEPENDENT_AMBULATORY_CARE_PROVIDER_SITE_OTHER): Payer: Self-pay | Admitting: Surgery

## 2022-01-13 ENCOUNTER — Ambulatory Visit (INDEPENDENT_AMBULATORY_CARE_PROVIDER_SITE_OTHER): Payer: Medicare Other | Admitting: Surgery

## 2022-01-13 ENCOUNTER — Other Ambulatory Visit: Payer: Self-pay

## 2022-01-13 VITALS — BP 151/76 | HR 73 | Temp 96.8°F | Resp 18 | Ht 73.0 in | Wt 330.0 lb

## 2022-01-13 DIAGNOSIS — L97319 Non-pressure chronic ulcer of right ankle with unspecified severity: Secondary | ICD-10-CM

## 2022-01-13 NOTE — Progress Notes (Signed)
[facility]  GENERAL SURGERY, [facility] GENERAL SURGERY    Progress Note    Name: Martin Porter MRN:  [id number]   Date: 01/13/2022 Age: 56 y.o.          Date of Birth:  06-29-1965  PCP: Samuella Cota, PA-C  Referring:  No ref. provider found     HPI:  Martin Porter is a 56 y.o. White male who returns evaluation of his right ankle wound.  Was substantially improved that last office visit.  Has been able to transition to dressing changes without use of the wound VAC. home health has reported ongoing improvement as well.          Past Medical History:   Diagnosis Date    Abnormal prostate specific antigen     Anemia, unspecified     Atrial fibrillation (CMS HCC)     Candida infection     CKD (chronic kidney disease) stage 3, GFR 30-59 ml/min (CMS HCC)     Cyst of kidney, acquired     Diabetic peripheral neuropathy (CMS HCC)     GERD (gastroesophageal reflux disease)     Herpes simplex     Hiatal hernia     Hypogonadism in male     Hypomagnesemia     Impotence     Legal blindness     Mixed hyperlipidemia     Obesity, unspecified     12/18 pt would like referral for bariatric surgery, would like to be seen by [facility] because of his hx of transplant    Obstructive sleep apnea syndrome     4/18 compliant with CPAP, using nasal PILLOW MILD OSA. 12/17 MILD, WILL FOLLOW UP WITH SLEEP MED FOR CPAP MAY  BE BECAUSE FATIGUE    Peptic ulcer     9/18 seen by gen surg/Reese, EGD and sono pending gastric ulcer, no neoplasm, H pylori negative 3/18 will need to decide on f/u EGD with Dr. Despina Arias    Polyp of colon     Renal transplant recipient     Testicular hypofunction     5/18 followed by urology, they will follow PSA and testosterone levels prescribed gel 5/18 PSA 1.4 (slightly lower than last) followed by urology 5/18 PSA velocity increased form 2016-2017, testosterone held, was to have repeat PSA/VitD and T levels in our clinic and CC to Dr. Ricki Miller Right testicular hypotrophy    Tobacco dependence syndrome      Type 2 diabetes mellitus without complications (CMS HCC)     Unspecified glaucoma(365.9)     Vitamin D deficiency       Past Surgical History:   Procedure Laterality Date    COLONOSCOPY      DEBRIDEMENT  FOOT Right     EYE SURGERY      FOOT SURGERY      HX HERNIA REPAIR      HX RENAL TRANSPLANT      1/20 pt has fu Feb 5th 12/19 Cr 1.69 GFR 45 6/19 Cr 1.41 GFR 56 3/19 Cr 1.67 GFR 46 11/18 seen by Dr. Clyde Canterbury, renal fx worsening, no comment on cause 11/18 Cr 1.65 GFR 478/18 Cr 1.95 GFR 38 6/18 Cr 1.5, Ca mildly high, SPE P/UPEPpending and asap f/u with nephrology 5/18 back to baseline cr 1.2 and GFR 68 CKD GFR 59 5/18 and was 59 11/17. Last Creat 1.36 increased from 1.2 11/17.    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 It seems that pt underwent  pancreas tp to "cure" DM/islet cells  a few years ago pt seen by endo and given OHG had pancreatitis, thought due to the DM medicines    SHOULDER SURGERY        No outpatient medications have been marked as taking for the 01/13/22 encounter (Office Visit) with Esmond Plants, MD.      No Known Allergies        BP (!) 151/76   Pulse 73   Temp 36 C (96.8 F)   Resp 18   Ht 1.854 m (6\' 1" )   Wt (!) 150 kg (330 lb)   SpO2 94%   BMI 43.54 kg/m          General: appropriate for age. in no acute distress. Obese    Vital signs are present above and have been reviewed by me     HEENT: Atraumatic, Normocephalic.    Lungs: Nonlabored breathing with symmetric expansion    Heart:Regular wth respect to rate and rythmn.    Abdomen:Soft. Nontender. Nondistended     Right ankle:  Very superficial wound on the posterior aspect of the ankle.  Some mild surrounding hyperpigmentation but no erythema.  Continues to improve.    Psychiatric: Alert and oriented to person, place, and time. affect  appropriate       Assessment/Plan:  Assessment/Plan   1. Non-healing ulcer of right ankle, unspecified ulcer stage (CMS HCC)         Continue local wound care.  No need for any other intervention at this time.  Continues to improve with each visit.  Recheck 2-3 weeks      This note was partially created using voice recognition software and is inherently subject to errors including those of syntax and "sound alike " substitutions which may escape proof reading. In such instances, original meaning may be extrapolated by contextual derivation.    MD MBA CPE FACS

## 2022-01-18 ENCOUNTER — Encounter (INDEPENDENT_AMBULATORY_CARE_PROVIDER_SITE_OTHER): Payer: Self-pay | Admitting: Surgery

## 2022-01-20 ENCOUNTER — Other Ambulatory Visit (INDEPENDENT_AMBULATORY_CARE_PROVIDER_SITE_OTHER): Payer: Self-pay | Admitting: Internal Medicine

## 2022-01-24 ENCOUNTER — Telehealth (INDEPENDENT_AMBULATORY_CARE_PROVIDER_SITE_OTHER): Payer: Self-pay | Admitting: Internal Medicine

## 2022-01-29 ENCOUNTER — Encounter (INDEPENDENT_AMBULATORY_CARE_PROVIDER_SITE_OTHER): Payer: Self-pay | Admitting: Internal Medicine

## 2022-01-29 ENCOUNTER — Ambulatory Visit: Payer: Medicare Other | Attending: Internal Medicine | Admitting: Internal Medicine

## 2022-01-29 ENCOUNTER — Other Ambulatory Visit: Payer: Self-pay

## 2022-01-29 VITALS — BP 160/80 | HR 81 | Temp 98.2°F | Ht 73.0 in | Wt 325.0 lb

## 2022-01-29 DIAGNOSIS — R351 Nocturia: Secondary | ICD-10-CM | POA: Insufficient documentation

## 2022-01-29 DIAGNOSIS — N401 Enlarged prostate with lower urinary tract symptoms: Secondary | ICD-10-CM | POA: Insufficient documentation

## 2022-01-29 DIAGNOSIS — Z23 Encounter for immunization: Secondary | ICD-10-CM | POA: Insufficient documentation

## 2022-01-29 DIAGNOSIS — Z0000 Encounter for general adult medical examination without abnormal findings: Secondary | ICD-10-CM | POA: Insufficient documentation

## 2022-01-29 DIAGNOSIS — E291 Testicular hypofunction: Secondary | ICD-10-CM | POA: Insufficient documentation

## 2022-01-29 MED ORDER — PANTOPRAZOLE 40 MG TABLET,DELAYED RELEASE
40.0000 mg | DELAYED_RELEASE_TABLET | Freq: Two times a day (BID) | ORAL | 3 refills | Status: DC
Start: 2022-01-29 — End: 2022-05-09

## 2022-01-29 MED ORDER — AMLODIPINE 5 MG TABLET
5.0000 mg | ORAL_TABLET | Freq: Two times a day (BID) | ORAL | 2 refills | Status: DC
Start: 2022-01-29 — End: 2022-04-15

## 2022-01-29 MED ORDER — LIPASE-PROTEASE-AMYLASE 24,000-76,000-120,000 UNIT CAPSULE,DELAYED REL
2.0000 | DELAYED_RELEASE_CAPSULE | Freq: Two times a day (BID) | ORAL | 3 refills | Status: DC
Start: 2022-01-29 — End: 2022-05-09

## 2022-01-29 MED ORDER — FAMOTIDINE 40 MG TABLET
40.0000 mg | ORAL_TABLET | Freq: Two times a day (BID) | ORAL | 3 refills | Status: DC
Start: 2022-01-29 — End: 2022-07-10

## 2022-01-29 NOTE — Progress Notes (Incomplete)
INTERNAL MEDICINE, BUILDING A  [address]  [place] [address]  Operated by [facility]  Medicare Annual Wellness Visit    Name: Martin Porter MRN:  [id number]   Date: 01/29/2022 Age: 56 y.o.       SUBJECTIVE:   Martin Porter is a 56 y.o. male for presenting for Medicare Wellness exam.   I have reviewed and reconciled the medication list with the patient today.        01/29/2022     2:35 PM   Comprehensive Health Assessment-Adult   Do you wish to complete this form? Yes   During the past 4 weeks, how would you rate your health in general? Very Good   During the past 4 weeks, how much difficulty have you had doing your usual activities inside and outside your home because of medical or emotional problems? No difficulty at all   During the past 4 weeks, was someone available to help you if you needed and wanted help? Yes, as much as I wanted   In the past year, how many times have you gone to the emergency department or been admitted to a hospital for a health problem? 1 time   Are you generally satisfied with your sleep? Yes   Do you have enough money to buy things you need in everyday life, such as food, clothing, medicines, and housing? Yes, always   Can you get to places beyond walking distance without help?  (For example, can you drive your own car or travel alone on buses)? Yes   Do you fasten your seatbelt when you are in a car? Yes, usually   Do you exercise 20 minutes 3 or more days per week (such as walking, dancing, biking, mowing grass, swimming)? Yes, some of the time   How often do you eat food that is healthy (fruits, vegetables, lean meats) instead of unhealthy (sweets, fast food, junk food, fatty foods)? Most of the time   Have your parents, brothers or sisters had any of the following problems before the age of 62? (check all that apply) Heart problems, or hardening of the arteries;Diabetes (sugar);Alcohol or drug addiction (or abuse);Cancer;High cholesterol   How  often do you have trouble taking medicines the eay you are told to take them? I always take them as prescribed   Do you need any help communicating with your doctors and nurses because of vision or hearing problems? No   During the past 12 months, have you experienced confusion or memory loss that is happening more often or is getting worse? No   Do you have one person you think of as your personal doctor (primary care provider or family doctor)? Yes   If you are seeing a Primary Care Provider (PCP) or family doctor. please list their name Rico Ala   How confident are you that you can control or manage most of your health problems? Very confident         I have reviewed and updated as appropriate the past medical, family and social history. 01/29/2022 as summarized below:  Past Medical History:   Diagnosis Date    Abnormal prostate specific antigen     Anemia, unspecified     Atrial fibrillation (CMS HCC)     Candida infection     CKD (chronic kidney disease) stage 3, GFR 30-59 ml/min (CMS HCC)     Cyst of kidney, acquired     Diabetic peripheral neuropathy (CMS HCC)  GERD (gastroesophageal reflux disease)     Herpes simplex     Hiatal hernia     Hypogonadism in male     Hypomagnesemia     Impotence     Legal blindness     Mixed hyperlipidemia     Obesity, unspecified     12/18 pt would like referral for bariatric surgery, would like to be [facility] by [facility] because of his hx of transplant    Obstructive sleep apnea syndrome     4/18 compliant with CPAP, using nasal PILLOW MILD OSA. 12/17 MILD, WILL FOLLOW UP WITH SLEEP MED FOR CPAP MAY  BE BECAUSE FATIGUE    Peptic ulcer     9/18 seen by gen surg/Reese, EGD and sono pending gastric ulcer, no neoplasm, H pylori negative 3/18 will need to decide on f/u EGD with Dr. Waynette Buttery    Polyp of colon     Renal transplant recipient     Testicular hypofunction     5/18 followed by urology, they will follow PSA and testosterone levels prescribed gel 5/18 PSA 1.4 (slightly lower  than last) followed by urology 5/18 PSA velocity increased form 2016-2017, testosterone held, was to have repeat PSA/VitD and T levels in our clinic and CC to Dr. Charlies Silvers Right testicular hypotrophy    Tobacco dependence syndrome     Type 2 diabetes mellitus without complications (CMS [place])     Unspecified glaucoma(365.9)     Vitamin D deficiency      Past Surgical History:   Procedure Laterality Date    Colonoscopy      Debridement  foot Right     Eye surgery      Foot surgery      Hx hernia repair      Hx renal transplant      Hx upper endoscopy      Kidney surgery      Other surgical history      Shoulder surgery       Current Outpatient Medications   Medication Sig    acetaminophen (TYLENOL) 500 mg Oral Tablet Take 1 Tablet (500 mg total) by mouth Every 4 hours as needed for Pain    amLODIPine (NORVASC) 5 mg Oral Tablet TAKE ONE TABLET BY MOUTH TWICE A DAY    aspirin (ECOTRIN) 81 mg Oral Tablet, Delayed Release (E.C.) Take 1 Tablet (81 mg total) by mouth    atenoloL (TENORMIN) 50 mg Oral Tablet TAKE ONE TABLET BY MOUTH TWICE A DAY    atenoloL (TENORMIN) 50 mg Oral Tablet Take 1 Tablet (50 mg total) by mouth Once a day (Patient not taking: Reported on 01/29/2022)    atropine (ISOPTO ATROPINE) 1 % Ophthalmic Drops 1 Drop Four times a day    carvediloL (COREG) 25 mg Oral Tablet Take 1 Tablet (25 mg total) by mouth Twice daily with food    cholecalciferol, vitamin D3, 1,250 mcg (50,000 unit) Oral Capsule TAKE 1 CAPSULE ONCE A WEEK    CREON 12,000-38,000 -60,000 unit Oral Capsule, Delayed Release(E.C.) TAKE FOUR CAPSULES BY MOUTH TWICE A DAY    cyanocobalamin (VITAMIN B 12) 1,000 mcg Oral Tablet Take 2 Tablets (2,000 mcg total) by mouth    docusate sodium (COLACE) 100 mg Oral Capsule Take 1 Capsule (100 mg total) by mouth    dorzolamide-timoloL (COSOPT) 22.3-6.8 mg/mL Ophthalmic Drops Administer 1 Drop into the left eye Twice daily    ergocalciferol, vitamin D2, (DRISDOL) 1,250 mcg (50,000 unit) Oral Capsule Take 1  Capsule (  50,000 Units total) by mouth Every 7 days    famotidine (PEPCID) 40 mg Oral Tablet Take 1 Tablet (40 mg total) by mouth Twice daily    fenofibrate (LOFIBRA) 160 mg Oral Tablet TAKE ONE TABLET BY MOUTH EVERY DAY    fenofibrate (LOFIBRA) 160 mg Oral Tablet Take 1 Tablet (160 mg total) by mouth Once a day (Patient not taking: Reported on 01/29/2022)    gabapentin (NEURONTIN) 800 mg Oral Tablet TAKE ONE TABLET BY MOUTH THREE TIMES A DAY    gabapentin (NEURONTIN) 800 mg Oral Tablet Take 1 Tablet (800 mg total) by mouth Three times a day    hydroCHLOROthiazide (HYDRODIURIL) 25 mg Oral Tablet TAKE ONE TABLET BY MOUTH EVERY DAY    insulin aspart U-100 (NOVOLOG) 100 unit/mL (3 mL) Subcutaneous Insulin Pen Inject 25 Units under the skin Twice a day before meals 25 units every morning  15 units every evening    insulin glargine 100 unit/mL Subcutaneous injection Inject 45 Units under the skin Every morning with breakfast for 30 days    insulin glargine 100 unit/mL Subcutaneous injection Inject 35 Units under the skin Every night    iron ps complex-B12-folic acid (FERREX FORTE) 150-25-1 mg-mcg-mg Oral Capsule Take 2 Capsules by mouth Once a day    L. acidophilus/L. bifidus (LACTOBACILLUS ACIDOPH-L. BIFID ORAL) Take 2 Tablets by mouth (Patient not taking: Reported on 01/29/2022)    magnesium oxide (MAG-OX) 400 mg Oral Tablet Take 1 Tablet (400 mg total) by mouth Twice daily    multivitamin with iron Oral Tablet Take 1 Tablet by mouth Once a day    mycophenolate sodium (MYFORTIC) 180 mg Oral Tablet, Delayed Release (E.C.) Take 3 Tablets (540 mg total) by mouth Twice daily    nystatin (MYCOSTATIN) 100,000 unit/gram Cream Apply topically Twice daily    ondansetron (ZOFRAN) 4 mg Oral Tablet Take 1 Tablet (4 mg total) by mouth Every 8 hours as needed for Nausea/Vomiting (Patient not taking: Reported on 01/29/2022)    pantoprazole (PROTONIX) 40 mg Oral Tablet, Delayed Release (E.C.) TAKE ONE TABLET BY MOUTH TWICE A DAY     potassium citrate (UROCIT-K) 10 mEq (1,080 mg) Oral Tablet Sustained Release Take 1 Tablet (10 mEq total) by mouth Twice daily with food    pravastatin (PRAVACHOL) 10 mg Oral Tablet TAKE ONE TABLET BY MOUTH EVERY DAY    pravastatin (PRAVACHOL) 40 mg Oral Tablet Take 1 Tablet (40 mg total) by mouth Every evening (Patient not taking: Reported on 01/29/2022)    prednisoLONE acetate (PRED FORTE) 1 % Ophthalmic Drops, Suspension Instill 1 Drop into both eyes Twice daily    promethazine-dextromethorphan (PHENERGAN-DM) 6.25-15 mg/5 mL Oral Syrup Take 5 mL by mouth Four times a day as needed for Cough (Patient not taking: Reported on 01/29/2022)    SSD 1 % Cream APPLY AS DIRECTED ONCE A DAY TO HEEL AND ANKLE    tacrolimus (PROGRAF) 1 mg Oral Capsule Take 3 Capsules (3 mg total) by mouth Twice daily    testosterone (ANDROGEL) 20.25 mg/1.25 gram (1.62 %) Transdermal Gel in Metered-dose Pump Place 20.25 mg on the skin Once a day    trimethoprim-sulfamethoxazole (BACTRIM DS) 160-866m per tablet Take 1 Tablet (160 mg total) by mouth Twice daily (Patient not taking: Reported on 01/29/2022)    trimethoprim-sulfamethoxazole (BACTRIM) 80-4036mper tablet TAKE 1 TABLET 3 TIMES A WEEK    TRUEPLUS PEN NEEDLE 31 gauge x 3/16" Does not apply Needle USE 5 TIMES A DAY    vitamin  E 400 unit Oral Capsule Take 1 Capsule (400 Units total) by mouth Once a day     Family Medical History:       Problem Relation (Age of Onset)    COPD Father    Diabetes type II Mother, Father    Other Maternal Grandfather            Social History     Socioeconomic History    Marital status: Single   Tobacco Use    Smoking status: Never    Smokeless tobacco: Current     Types: Snuff   Vaping Use    Vaping Use: Never used   Substance and Sexual Activity    Alcohol use: Never    Drug use: Never    Sexual activity: Not Currently     Social Determinants of Health     Financial Resource Strain: Low Risk  (11/04/2021)    Financial Resource Strain     SDOH Financial: No    Transportation Needs: High Risk (11/04/2021)    Transportation Needs     SDOH Transportation: Yes, it has kept me from medical appointments or from getting my medications   Social Connections: Medium Risk (11/04/2021)    Social Connections     SDOH Social Isolation: 3 to 5 times a week   Intimate Partner Violence: Low Risk  (11/04/2021)    Intimate Partner Violence     SDOH Domestic Violence: No   Housing Stability: Low Risk  (11/04/2021)    Housing Stability     SDOH Housing Situation: I have housing.     SDOH Housing Worry: No   Health Literacy: Low Risk  (11/04/2021)    Health Literacy     SDOH Health Literacy: Never   Employment Status: High Risk (11/04/2021)    Employment Status     SDOH Employment: Unemployed         List of [place] Providers   Care Team       PCP       Name Type Specialty Phone Number    [place], Janalyn Harder, [place] [facility] [place] [phone]              Care Team       No care team found                      Health Maintenance   Topic Date Due    Diabetic Retinal Exam  Never done    Pneumococcal Vaccine, Age 57-64 (1 - PCV) 08/18/1970    HIV Screening  Never done    Diabetic Kidney Health Microalb/Cr Ratio  Never done    Shingles Vaccine (1 of 2) Never done    Colonoscopy  Never done    Hepatitis B Vaccine (3 of 3 - 19+ 3-dose series) 05/07/2018    Influenza Vaccine (1) 01/03/2022    Diabetic A1C  05/07/2022    Diabetic Kidney Health eGFR  11/05/2022    Depression Screening  01/30/2023    Annual Wellness Visit  01/30/2023    Adult Tdap-Td (2 - Td or Tdap) 06/04/2026    Covid-19 Vaccine  Completed    Meningococcal Vaccine  Aged Out     Medicare Wellness Assessment   Medicare initial or wellness physical in the last year?: Yes  Advance Directives   Does patient have a living will or MPOA: YES   Has patient provided [employer] with a copy?: no  Advance directive information given to the patient today?: no      Activities of Daily Living   Do you need help with  dressing, bathing, or walking?: No   Do you need help with shopping, housekeeping, medications, or finances?: No   Do you have rugs in hallways, broken steps, or poor lighting?: No   Do you have grab bars in your bathroom, non-slip strips in your tub, and hand rails on your stairs?: Yes   Urinary Incontinence Screen       Cognitive Function Screen (1=Yes, 0=No)   What is you age?: Correct   What is the time to the nearest hour?: Correct   What is the year?: Correct   What is the name of this clinic?: Correct   Can the patient recognize two persons (the doctor, the nurse, home help, etc.)?: Correct   What is the date of your birth? (day and month sufficient) : Correct   In what year did World War II end?: Correct   Who is the current president of the [place]?: Correct   Count from 20 down to 1?: Correct   What address did I give you earlier?: Correct   Total Score: 10   Interpretation of Total Score: Greater than 6 Normal   Hearing Screen   Have you noticed any hearing difficulties?: No  After whispering 9-1-6 how many numbers did the patient repeat correctly?: 1  After whispering 4-7-8 how many numbers did the patient repeat correctly?: 3   Fall Risk Screen   Do you feel unsteady when standing or walking?: Yes  Do you worry about falling?: Yes   Vision Screen           Depression Screen     Little interest or pleasure in doing things.: Not at all  Feeling down, depressed, or hopeless: Not at all  PHQ 2 Total: 0     Pain Score        Substance Use-Abuse Screening     Tobacco Use     In Past 12 MONTHS, how often have you used any tobacco product (for example, cigarettes, e-cigarettes, cigars, pipes, or smokeless tobacco)?: Daily  In the PAST 3 MONTHS, did you smoke a cigarette containing tobacco or use any other nicotine delivery product (i.e., e-cigarette, vaping or chewing tobacco)?: Yes  In the PAST 3 MONTHS, did you usually smoke more than 10 cigarettes, vape, use an e-cigarette or chew tobacco more than 10  times each day?: No  In the PAST 3 MONTHS, did you usually smoke/use an e-cigarette, vape or chew tobacco within 30 minutes after waking?: Yes     Alcohol use     In the PAST 12 MONTHS, how often have you had 5 (men)/4 (women) or more drinks containing alcohol in one day?: Never     Prescription Drug Use     In the PAST 12 months, how often have you used any prescription medications just for the feeling, more than prescribed, or that were not prescribed for you? Prescriptions may include: opioids, benzodiazepines, medications for ADHD: Never           Illicit Drug Use   In the PAST 12 MONTHS, how often have you used any drugs, including marijuana, cocaine or crack, heroin, methamphetamine, hallucinogens, ecstasy/MDMA?: Never         OBJECTIVE:   BP (!) 160/80 (Site: Left, Patient Position: Sitting)   Pulse 81   Temp 36.8 C (98.2 F)   Ht  1.854 m ('6\' 1"' )   Wt (!) 147 kg (325 lb)   SpO2 95%   BMI 42.88 kg/m        Other appropriate exam:    Health Maintenance Due   Topic Date Due    Diabetic Retinal Exam  Never done    Pneumococcal Vaccine, Age 55-64 (1 - PCV) 08/18/1970    HIV Screening  Never done    Diabetic Kidney Health Microalb/Cr Ratio  Never done    Shingles Vaccine (1 of 2) Never done    Colonoscopy  Never done    Hepatitis B Vaccine (3 of 3 - 19+ 3-dose series) 05/07/2018    Influenza Vaccine (1) 01/03/2022      ASSESSMENT & PLAN:   Assessment/Plan   1. Medicare annual wellness visit, subsequent       Identified Risk Factors/ Recommended Actions     Fall Risk Follow up plan of care: Discussed optimizing home safety  The PHQ 2 Total: 0 depression screen is interpreted as negative.          No orders of the defined types were placed in this encounter.         The patient has been educated about risk factors and recommended preventive care. Written Prevention Plan completed/ updated and given to patient (see After Visit Summary).    Meds reviewed as well as labs.  Chart reviewed and updated.   Continue  current treatment.  Keep follow-up appointment.   Vaccine hx reviewed.    [place] for refill on androgel  sent to dr bowling       Delaney Meigs, PA-C

## 2022-01-29 NOTE — Patient Instructions (Signed)
Medicare Preventive Services  Medicare coverage information Recommendation for YOU   Heart Disease and Diabetes   Lipid profile every 5 years or more often if at risk for cardiovascular disease  Last Lipid Panel  (Last result in the past 2 years)      Cholesterol   HDL   LDL   Direct LDL   Triglycerides        11/04/21 1543 171   42   101     139            Diabetes Screening with Blood Glucose test or Glucose Tolerance Test  yearly for those at risk for diabetes, up to two tests per year for those with prediabetes  Last Glucose: 159     Diabetes Self-Management Training initial training ten hours per year, and follow-up training two hours per subsequent year. Optional for those with diabetes    Medical Nutrition Therapy three hours of one-on-one counseling in first year, two hours in subsequent years. Optional for those with diabetes, kidney disease   Intensive Behavioral Therapy for Obesity  Face-to-face counseling, first month every week, month 2-6 every other week, month 7-12 every month if continued progress is documented Optional for those with Body Mass Index 30 or higher  Your Body mass index is 42.88 kg/m.   Tobacco Cessation (Quitting) Counseling   Two attempts per year, max 4 sessions per attempt, up to 8 sessions per year Optional for those who use tobacco    Cancer Screening   Colorectal screening   For anyone age 63 to 89 or any age if high risk:  Screening Colonoscopy every 10 yrs if low risk,  more frequent if higher risk  OR  Cologuard Stool DNA test once every 3 years OR  Fecal Occult Blood Testing yearly OR  Flexible  Sigmoidoscopy  every 5 yr OR  CT Colonography every 5 yrs  See Your Schedule below   Prostate Cancer Screening  Prostate Specific Antigen blood test based on joint decision making with your provider for ages 87-69  A joint decision between you and your primary care provider   Abdominal aortic aneurysm screening  One time screening for men 51 or older who have smoked more than 100  cigarettes in their lifetime.  See Your Schedule below   Lung Cancer Screening  Annual low dose computed tomography (LDCT scan) is recommended for those age 53-77 who smoked 20 pack-years and are current smokers or quit smoking within past 15 years (one pack-year= smoking one PPD for one year), after counseling by your doctor or nurse clinician about the possible benefits or harms. See Your Schedule below   Vaccinations   Pneumococcal Vaccine: Recommended routinely age 26+ with one or two separate vaccines based on your risk    Recommended before age 60 if medical conditions with increased risk  Seasonal Influenza Vaccine: Once every flu season   Hepatitis B Vaccine: 3 doses if risk (including anyone with diabetes or liver disease)  Shingles Vaccine: Two doses at age 29 or older  Diphtheria Tetanus Pertussis Vaccine: ONCE as adult, booster every 10 years     Immunization History   Administered Date(s) Administered   . Covid-19 Vaccine,Pfizer Bivalent,48mg/0.3ml,12 yrs+ 07/10/2021   . Covid-19 Vaccine,Pfizer-BioNTech,Gray Top,138yr 07/04/2020   . Covid-19 Vaccine,Pfizer-BioNTech,Purple Top,1225yr01/03/2020, 06/06/2019, 01/20/2020   . HEPATITIS A VACCINE -ADULT 07/21/2017, 11/04/2017   . Hep B, Unspecified Formulation 11/04/2017, 12/08/2017   . Influenza Vaccine, 6 month-adult 05/30/2021   . Pneumococcal,  Unspecified Formulation 05/05/2012   . Tetanus Toxoid/Diphtheria Toxoid/Acellular Pertussis Vaccine, Adsorbed (Tdap) 06/04/2016     Shingles vaccine and Diphtheria Tetanus Pertussis vaccines are available at pharmacies or local health department without a prescription.   Other Screening   Glaucoma Screening   yearly if in high risk group such as diabetes, family history, African American age 71+ or Hispanic American age 100+ See your Eye Care Provider   Hepatitis C Screening recommended ONCE for those born between 1945-1965, or high risk for HCV infection  See Your Schedule below   HIV Testing recommended routinely  at least ONCE, covered every year for age 50 to 48 regardless of risk, and every year for age over 57 who ask for the test or higher risk See Your Schedule belowAbdominal Aortic Aneurysm Screening Ultrasound Recommended ONCE for any male who smoked 100 cigarettes/lifetime OR with family history of aortic aneurysm       Your Personalized Schedule for Preventive Tests   Health Maintenance: Pending and Last Completed       Date Due Completion Date    Diabetic Retinal Exam Never done ---    Pneumococcal Vaccine, Age 24-64 (1 - PCV) 08/18/1970 05/05/2012    HIV Screening Never done ---    Diabetic Kidney Health Microalb/Cr Ratio Never done ---    Shingles Vaccine (1 of 2) Never done ---    Colonoscopy Never done ---    Hepatitis B Vaccine (3 of 3 - 19+ 3-dose series) 05/07/2018 12/08/2017    Influenza Vaccine (1) 01/03/2022 05/30/2021    Diabetic A1C 05/07/2022 11/04/2021    Diabetic Kidney Health eGFR 11/05/2022 11/04/2021    Depression Screening 01/30/2023 01/29/2022    Annual Wellness Visit 01/30/2023 01/29/2022    Adult Tdap-Td (2 - Td or Tdap) 06/04/2026 06/04/2016

## 2022-02-03 ENCOUNTER — Encounter (INDEPENDENT_AMBULATORY_CARE_PROVIDER_SITE_OTHER): Payer: Self-pay | Admitting: Surgery

## 2022-02-08 NOTE — Progress Notes (Signed)
[facility]    Progress Note    Name: Martin Porter MRN:  [id number]   Date: 02/10/2022 Age: 56 y.o.          Date of Birth:  August 08, 1965  PCP: Delaney Meigs, PA-C  Referring:  No ref. provider found     HPI:  Martin Porter is a 56 y.o. White male who returns for repeat evaluation of his right ankle. Has continued to improve.   He has no complaints the current time.  Has continued to use Silvadene for the site.        Past Medical History:   Diagnosis Date    Abnormal prostate specific antigen     Anemia, unspecified     Atrial fibrillation (CMS HCC)     Candida infection     CKD (chronic kidney disease) stage 3, GFR 30-59 ml/min (CMS HCC)     Cyst of kidney, acquired     Diabetic peripheral neuropathy (CMS HCC)     GERD (gastroesophageal reflux disease)     Herpes simplex     Hiatal hernia     Hypogonadism in male     Hypomagnesemia     Impotence     Legal blindness     Mixed hyperlipidemia     Obesity, unspecified     12/18 pt would like referral for bariatric surgery, would like to be [facility] by [facility] because of his hx of transplant    Obstructive sleep apnea syndrome     4/18 compliant with CPAP, using nasal PILLOW MILD OSA. 12/17 MILD, WILL FOLLOW UP WITH SLEEP MED FOR CPAP MAY  BE BECAUSE FATIGUE    Peptic ulcer     9/18 seen by gen surg/[facility], EGD and sono pending gastric ulcer, no neoplasm, H pylori negative 3/18 will need to decide on f/u EGD with Dr. Waynette Buttery    Polyp of colon     Renal transplant recipient     Testicular hypofunction     5/18 followed by urology, they will follow PSA and testosterone levels prescribed gel 5/18 PSA 1.4 (slightly lower than last) followed by urology 5/18 PSA velocity increased form 2016-2017, testosterone held, was to have repeat PSA/VitD and T levels in our clinic and CC to Dr. Charlies Silvers Right testicular hypotrophy    Tobacco dependence syndrome     Type 2 diabetes mellitus without complications (CMS [place])      Unspecified glaucoma(365.9)     Vitamin D deficiency       Past Surgical History:   Procedure Laterality Date    COLONOSCOPY      DEBRIDEMENT  FOOT Right     EYE SURGERY      FOOT SURGERY      HX HERNIA REPAIR      HX RENAL TRANSPLANT      1/20 pt has fu Feb 5th 12/19 Cr 1.69 GFR 45 6/19 Cr 1.41 GFR 56 3/19 Cr 1.67 GFR 46 11/18 seen by Dr. Marcy Panning, renal fx worsening, no comment on cause 11/18 Cr 1.65 GFR 478/18 Cr 1.95 GFR 38 6/18 Cr 1.5, Ca mildly high, SPE P/UPEPpending and asap f/u with nephrology 5/18 back to baseline cr 1.2 and GFR 68 CKD GFR 59 5/18 and was 59 11/17. Last Creat 1.36 increased from 1.2 11/17.    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 It seems that pt underwent  pancreas tp to "cure" DM/islet cells  a few years ago pt seen by endo and given OHG had pancreatitis, thought due to the DM medicines    SHOULDER SURGERY        No outpatient medications have been marked as taking for the 02/10/22 encounter (Office Visit) with Shirlee More, MD.      No Known Allergies        BP (!) 163/79   Pulse 86   Temp 36.3 C (97.4 F)   Resp 18   Ht 1.854 m (6\' 1" )   Wt (!) 148 kg (327 lb)   SpO2 92%   BMI 43.14 kg/m          General: appropriate for age. in no acute distress.    Vital signs are present above and have been reviewed by me     HEENT: Atraumatic, Normocephalic.    Lungs: Nonlabored breathing with symmetric expansion    Heart:Regular wth respect to rate and rythmn.    Abdomen:Soft. Nontender. Nondistended     Right ankle:  Much improved.  Only a small eschar remains at this point.  No erythema.  No open wound.  No drainage.    Psychiatric: Alert and oriented to person, place, and time. affect appropriate       Assessment/Plan:  Assessment/Plan   1. Non-healing ulcer of right ankle, unspecified  ulcer stage (CMS HCC)         Essentially healed at this point with only a small residual eschar.  Feel that he can transition from Silvadene to topical antibiotic ointment.  Does not require any other intervention at the current time.  I would be happy to re-evaluate at any point in the future.      This note was partially created using voice recognition software and is inherently subject to errors including those of syntax and "sound alike " substitutions which may escape proof reading. In such instances, original meaning may be extrapolated by contextual derivation.    Bridgett Larsson MD MBA CPE FACS

## 2022-02-10 ENCOUNTER — Other Ambulatory Visit: Payer: Self-pay

## 2022-02-10 ENCOUNTER — Encounter (INDEPENDENT_AMBULATORY_CARE_PROVIDER_SITE_OTHER): Payer: Self-pay | Admitting: Surgery

## 2022-02-10 ENCOUNTER — Ambulatory Visit (INDEPENDENT_AMBULATORY_CARE_PROVIDER_SITE_OTHER): Payer: Medicare Other | Admitting: Surgery

## 2022-02-10 VITALS — BP 163/79 | HR 86 | Temp 97.4°F | Resp 18 | Ht 73.0 in | Wt 327.0 lb

## 2022-02-10 DIAGNOSIS — L97319 Non-pressure chronic ulcer of right ankle with unspecified severity: Secondary | ICD-10-CM

## 2022-02-10 NOTE — Nursing Note (Signed)
Per Dr Venia Minks triple antibiotic ointment applied to patients right ankle, patient tolerated well with no complications or concerns. Shelby Mattocks, LPN  88/12/9167 45:03

## 2022-03-18 ENCOUNTER — Other Ambulatory Visit (RURAL_HEALTH_CENTER): Payer: Self-pay | Admitting: Internal Medicine

## 2022-03-25 ENCOUNTER — Other Ambulatory Visit (INDEPENDENT_AMBULATORY_CARE_PROVIDER_SITE_OTHER): Payer: Self-pay | Admitting: Internal Medicine

## 2022-03-25 ENCOUNTER — Other Ambulatory Visit (INDEPENDENT_AMBULATORY_CARE_PROVIDER_SITE_OTHER): Payer: Self-pay | Admitting: Surgery

## 2022-03-25 MED ORDER — TACROLIMUS 1 MG CAPSULE, IMMEDIATE-RELEASE
3.0000 mg | ORAL_CAPSULE | Freq: Two times a day (BID) | ORAL | 4 refills | Status: DC
Start: 2022-03-25 — End: 2022-06-18

## 2022-03-26 NOTE — Telephone Encounter (Signed)
Pharmacy is requesting refill on bactrim, need an appointment? Martin Mattocks, LPN  57/26/2035 59:74

## 2022-03-31 ENCOUNTER — Other Ambulatory Visit (INDEPENDENT_AMBULATORY_CARE_PROVIDER_SITE_OTHER): Payer: Self-pay | Admitting: Internal Medicine

## 2022-03-31 ENCOUNTER — Telehealth (INDEPENDENT_AMBULATORY_CARE_PROVIDER_SITE_OTHER): Payer: Self-pay | Admitting: Internal Medicine

## 2022-03-31 MED ORDER — INSULIN GLARGINE 100 UNITS/ML SUBQ - CHARGE BY DOSE
45.0000 [IU] | Freq: Every morning | SUBCUTANEOUS | 2 refills | Status: DC
Start: 2022-03-31 — End: 2022-07-17

## 2022-03-31 MED ORDER — SULFAMETHOXAZOLE 400 MG-TRIMETHOPRIM 80 MG TABLET
ORAL_TABLET | ORAL | 2 refills | Status: DC
Start: 2022-03-31 — End: 2022-07-16

## 2022-03-31 MED ORDER — POTASSIUM CITRATE ER 10 MEQ (1,080 MG) TABLET,EXTENDED RELEASE
10.0000 meq | ORAL_TABLET | Freq: Two times a day (BID) | ORAL | 2 refills | Status: DC
Start: 2022-03-31 — End: 2022-05-09

## 2022-04-15 ENCOUNTER — Other Ambulatory Visit (INDEPENDENT_AMBULATORY_CARE_PROVIDER_SITE_OTHER): Payer: Self-pay | Admitting: Internal Medicine

## 2022-04-25 ENCOUNTER — Telehealth (INDEPENDENT_AMBULATORY_CARE_PROVIDER_SITE_OTHER): Payer: Self-pay | Admitting: Internal Medicine

## 2022-04-25 MED ORDER — AMOXICILLIN 875 MG-POTASSIUM CLAVULANATE 125 MG TABLET
1.0000 | ORAL_TABLET | Freq: Two times a day (BID) | ORAL | 0 refills | Status: AC
Start: 2022-04-25 — End: 2022-05-05

## 2022-04-25 NOTE — Telephone Encounter (Signed)
Per kim Augmentin 875 bid x 10 days   Pt notifed

## 2022-04-30 ENCOUNTER — Other Ambulatory Visit (INDEPENDENT_AMBULATORY_CARE_PROVIDER_SITE_OTHER): Payer: Self-pay | Admitting: Internal Medicine

## 2022-05-09 ENCOUNTER — Ambulatory Visit: Payer: Medicare Other | Attending: Internal Medicine | Admitting: Internal Medicine

## 2022-05-09 ENCOUNTER — Encounter (INDEPENDENT_AMBULATORY_CARE_PROVIDER_SITE_OTHER): Payer: Self-pay | Admitting: Internal Medicine

## 2022-05-09 ENCOUNTER — Other Ambulatory Visit: Payer: Self-pay

## 2022-05-09 DIAGNOSIS — E1142 Type 2 diabetes mellitus with diabetic polyneuropathy: Secondary | ICD-10-CM | POA: Insufficient documentation

## 2022-05-09 DIAGNOSIS — N1831 Chronic kidney disease, stage 3a: Secondary | ICD-10-CM | POA: Insufficient documentation

## 2022-05-09 DIAGNOSIS — Z6841 Body Mass Index (BMI) 40.0 and over, adult: Secondary | ICD-10-CM | POA: Insufficient documentation

## 2022-05-09 DIAGNOSIS — D849 Immunodeficiency, unspecified: Secondary | ICD-10-CM | POA: Insufficient documentation

## 2022-05-09 DIAGNOSIS — R609 Edema, unspecified: Secondary | ICD-10-CM | POA: Insufficient documentation

## 2022-05-09 DIAGNOSIS — R351 Nocturia: Secondary | ICD-10-CM | POA: Insufficient documentation

## 2022-05-09 DIAGNOSIS — E113553 Type 2 diabetes mellitus with stable proliferative diabetic retinopathy, bilateral: Secondary | ICD-10-CM | POA: Insufficient documentation

## 2022-05-09 DIAGNOSIS — L0390 Cellulitis, unspecified: Secondary | ICD-10-CM

## 2022-05-09 DIAGNOSIS — N401 Enlarged prostate with lower urinary tract symptoms: Secondary | ICD-10-CM | POA: Insufficient documentation

## 2022-05-09 DIAGNOSIS — Z9483 Pancreas transplant status: Secondary | ICD-10-CM | POA: Insufficient documentation

## 2022-05-09 DIAGNOSIS — R0609 Other forms of dyspnea: Secondary | ICD-10-CM | POA: Insufficient documentation

## 2022-05-09 DIAGNOSIS — E119 Type 2 diabetes mellitus without complications: Secondary | ICD-10-CM | POA: Insufficient documentation

## 2022-05-09 LAB — COMPREHENSIVE METABOLIC PNL, FASTING
ALBUMIN/GLOBULIN RATIO: 1.5 — ABNORMAL HIGH (ref 0.8–1.4)
ALBUMIN: 4 g/dL (ref 3.5–5.7)
ALKALINE PHOSPHATASE: 39 U/L (ref 34–104)
ALT (SGPT): 17 U/L (ref 7–52)
ANION GAP: 8 mmol/L (ref 4–13)
AST (SGOT): 17 U/L (ref 13–39)
BILIRUBIN TOTAL: 0.4 mg/dL (ref 0.3–1.2)
BUN/CREA RATIO: 13 (ref 6–22)
BUN: 26 mg/dL — ABNORMAL HIGH (ref 7–25)
CALCIUM, CORRECTED: 9.5 mg/dL (ref 8.9–10.8)
CALCIUM: 9.5 mg/dL (ref 8.6–10.3)
CHLORIDE: 107 mmol/L (ref 98–107)
CO2 TOTAL: 26 mmol/L (ref 21–31)
CREATININE: 1.96 mg/dL — ABNORMAL HIGH (ref 0.60–1.30)
ESTIMATED GFR: 39 mL/min/{1.73_m2} — ABNORMAL LOW (ref >59)
GLOBULIN: 2.7 — ABNORMAL LOW (ref 2.9–5.4)
GLUCOSE: 103 mg/dL (ref 74–109)
OSMOLALITY, CALCULATED: 286 mOsm/kg (ref 270–290)
POTASSIUM: 4.4 mmol/L (ref 3.5–5.1)
PROTEIN TOTAL: 6.7 g/dL (ref 6.4–8.9)
SODIUM: 141 mmol/L (ref 136–145)

## 2022-05-09 LAB — VITAMIN D 25 TOTAL: VITAMIN D: 46 ng/mL (ref 30–100)

## 2022-05-09 LAB — CBC WITH DIFF
BASOPHIL #: 0 10*3/uL (ref 0.00–0.10)
BASOPHIL %: 0 % (ref 0–1)
EOSINOPHIL #: 0.2 10*3/uL (ref 0.00–0.50)
EOSINOPHIL %: 3 %
HCT: 32.3 % — ABNORMAL LOW (ref 36.7–47.1)
HGB: 10.9 g/dL — ABNORMAL LOW (ref 12.5–16.3)
LYMPHOCYTE #: 1.6 10*3/uL (ref 1.00–3.00)
LYMPHOCYTE %: 25 % (ref 16–44)
MCH: 31.4 pg (ref 23.8–33.4)
MCHC: 33.8 g/dL (ref 32.5–36.3)
MCV: 92.8 fL (ref 73.0–96.2)
MONOCYTE #: 0.5 10*3/uL (ref 0.30–1.00)
MONOCYTE %: 8 % (ref 5–13)
MPV: 10.6 fL (ref 7.4–11.4)
NEUTROPHIL #: 4.2 10*3/uL (ref 1.85–7.80)
NEUTROPHIL %: 65 % (ref 43–77)
PLATELETS: 245 10*3/uL (ref 140–440)
RBC: 3.48 10*6/uL — ABNORMAL LOW (ref 4.06–5.63)
RDW: 13.7 % (ref 12.1–16.2)
WBC: 6.4 10*3/uL (ref 3.6–10.2)

## 2022-05-09 LAB — LIPID PANEL
CHOL/HDL RATIO: 3.3
CHOLESTEROL: 176 mg/dL (ref <200)
HDL CHOL: 53 mg/dL (ref 23–92)
LDL CALC: 98 mg/dL (ref 0–100)
TRIGLYCERIDES: 127 mg/dL (ref <=150)
VLDL CALC: 25 mg/dL (ref 0–50)

## 2022-05-09 LAB — THYROID STIMULATING HORMONE (SENSITIVE TSH): TSH: 3.716 u[IU]/mL (ref 0.450–5.330)

## 2022-05-09 LAB — HGA1C (HEMOGLOBIN A1C WITH EST AVG GLUCOSE): HEMOGLOBIN A1C: 5.6 % (ref 4.0–6.0)

## 2022-05-09 MED ORDER — FENOFIBRATE 160 MG TABLET
160.0000 mg | ORAL_TABLET | Freq: Every day | ORAL | 4 refills | Status: DC
Start: 2022-05-09 — End: 2022-08-29

## 2022-05-09 MED ORDER — INSULIN ASPART (U-100) 100 UNIT/ML (3 ML) SUBCUTANEOUS PEN
25.0000 [IU] | PEN_INJECTOR | Freq: Two times a day (BID) | SUBCUTANEOUS | 3 refills | Status: DC
Start: 2022-05-09 — End: 2022-07-04

## 2022-05-09 MED ORDER — MYCOPHENOLATE SODIUM 180 MG TABLET,DELAYED RELEASE
540.0000 mg | DELAYED_RELEASE_TABLET | Freq: Two times a day (BID) | ORAL | 3 refills | Status: DC
Start: 2022-05-09 — End: 2023-12-07

## 2022-05-09 MED ORDER — INSULIN GLARGINE (U-100) 100 UNIT/ML (3 ML) SUBCUTANEOUS PEN
PEN_INJECTOR | SUBCUTANEOUS | 3 refills | Status: DC
Start: 2022-05-09 — End: 2022-08-25

## 2022-05-09 MED ORDER — AMLODIPINE 5 MG TABLET
5.0000 mg | ORAL_TABLET | Freq: Two times a day (BID) | ORAL | 2 refills | Status: DC
Start: 2022-05-09 — End: 2022-07-16

## 2022-05-09 MED ORDER — ATENOLOL 50 MG TABLET
50.0000 mg | ORAL_TABLET | Freq: Two times a day (BID) | ORAL | 1 refills | Status: DC
Start: 2022-05-09 — End: 2022-07-16

## 2022-05-09 MED ORDER — INSULIN ASPART (U-100) 100 UNIT/ML (3 ML) SUBCUTANEOUS PEN
25.0000 [IU] | PEN_INJECTOR | Freq: Two times a day (BID) | SUBCUTANEOUS | 3 refills | Status: DC
Start: 2022-05-09 — End: 2022-05-09

## 2022-05-09 MED ORDER — CARVEDILOL 25 MG TABLET
25.0000 mg | ORAL_TABLET | Freq: Two times a day (BID) | ORAL | 2 refills | Status: DC
Start: 2022-05-09 — End: 2022-07-16

## 2022-05-09 MED ORDER — CHOLECALCIFEROL (VITAMIN D3) 1,250 MCG (50,000 UNIT) CAPSULE
50000.0000 [IU] | ORAL_CAPSULE | ORAL | 2 refills | Status: DC
Start: 2022-05-09 — End: 2022-07-16

## 2022-05-09 MED ORDER — PANTOPRAZOLE 40 MG TABLET,DELAYED RELEASE
40.0000 mg | DELAYED_RELEASE_TABLET | Freq: Two times a day (BID) | ORAL | 3 refills | Status: DC
Start: 2022-05-09 — End: 2022-07-16

## 2022-05-09 MED ORDER — POTASSIUM CITRATE ER 10 MEQ (1,080 MG) TABLET,EXTENDED RELEASE
10.0000 meq | ORAL_TABLET | Freq: Two times a day (BID) | ORAL | 2 refills | Status: DC
Start: 2022-05-09 — End: 2022-07-16

## 2022-05-09 MED ORDER — LIPASE-PROTEASE-AMYLASE 24,000-76,000-120,000 UNIT CAPSULE,DELAYED REL
2.0000 | DELAYED_RELEASE_CAPSULE | Freq: Two times a day (BID) | ORAL | 3 refills | Status: DC
Start: 2022-05-09 — End: 2022-07-16

## 2022-05-09 MED ORDER — FUROSEMIDE 20 MG TABLET
20.0000 mg | ORAL_TABLET | Freq: Every day | ORAL | 0 refills | Status: DC
Start: 2022-05-09 — End: 2022-06-18

## 2022-05-09 MED ORDER — PEN NEEDLE, DIABETIC 31 GAUGE X 3/16"
3 refills | Status: DC
Start: 2022-05-09 — End: 2022-09-10

## 2022-05-09 MED ORDER — CEPHALEXIN 500 MG CAPSULE
500.0000 mg | ORAL_CAPSULE | Freq: Four times a day (QID) | ORAL | 0 refills | Status: AC
Start: 2022-05-09 — End: 2022-05-16

## 2022-05-09 MED ORDER — PRAVASTATIN 40 MG TABLET
40.0000 mg | ORAL_TABLET | Freq: Every evening | ORAL | 3 refills | Status: DC
Start: 2022-05-09 — End: 2022-05-12

## 2022-05-09 MED ORDER — GABAPENTIN 800 MG TABLET
800.0000 mg | ORAL_TABLET | Freq: Three times a day (TID) | ORAL | 2 refills | Status: DC
Start: 2022-05-09 — End: 2022-07-17

## 2022-05-09 NOTE — Progress Notes (Unsigned)
Name: Martin Porter                       Date of Birth: 1965-05-28   MRN:  [id number]                         Date of visit: 05/09/2022     PCP: Samuella CotaKimberly A Laurell Coalson, PA-C     Subjective  Martin Porter is a 57 y.o. year old male who presents for Follow Up 3 Months   to clinic.  No specialty comments available.   Patient Active Problem List    Diagnosis Date Noted    History of pancreas transplant (CMS HCC) 05/09/2022    Immunosuppression (CMS HCC) 05/09/2022    Class 3 severe obesity due to excess calories with serious comorbidity and body mass index (BMI) of 45.0 to 49.9 in adult [facility] 05/09/2022    Stable proliferative diabetic retinopathy of both eyes associated with type 2 diabetes mellitus (CMS HCC) 05/09/2022    Cellulitis 09/05/2021    Hypomagnesemia 07/15/2021    Testicular hypofunction 07/15/2021    Type 2 diabetes mellitus without complications (CMS HCC) 07/15/2021    CKD (chronic kidney disease) stage 3, GFR 30-59 ml/min (CMS HCC) 07/15/2021    Anemia, unspecified 07/15/2021    Mixed hyperlipidemia 07/15/2021    Atrial fibrillation (CMS HCC) 07/15/2021    Diabetic peripheral neuropathy (CMS HCC) 07/15/2021    GERD (gastroesophageal reflux disease) 07/15/2021    Renal transplant recipient 07/15/2021    Impotence 07/15/2021    Hypogonadism in male 07/15/2021    Obstructive sleep apnea syndrome 07/15/2021     4/18 compliant with CPAP, using nasal PILLOW MILD OSA. 12/17 MILD, WILL FOLLOW UP WITH SLEEP MED FOR CPAP MAY  BE BECAUSE FATIGUE      Candida infection 07/15/2021      Current Outpatient Medications   Medication Sig    acetaminophen (TYLENOL) 500 mg Oral Tablet Take 1 Tablet (500 mg total) by mouth Every 4 hours as needed for Pain    amLODIPine (NORVASC) 5 mg Oral Tablet Take 1 Tablet (5 mg total) by mouth Twice daily    aspirin (ECOTRIN) 81 mg Oral Tablet, Delayed Release (E.C.) Take 1 Tablet (81 mg total)  by mouth    atenoloL (TENORMIN) 50 mg Oral Tablet Take 1 Tablet (50 mg total) by mouth Twice daily    atropine (ISOPTO ATROPINE) 1 % Ophthalmic Drops 1 Drop Four times a day    carvediloL (COREG) 25 mg Oral Tablet Take 1 Tablet (25 mg total) by mouth Twice daily with food    cephalexin (KEFLEX) 500 mg Oral Capsule Take 1 Capsule (500 mg total) by mouth Four times a day for 7 days    cholecalciferol, vitamin D3, 1,250 mcg (50,000 unit) Oral Capsule Take 1 Capsule (50,000 Units total) by mouth Every 7 days    cyanocobalamin (VITAMIN B 12) 1,000 mcg Oral Tablet Take 2 Tablets (2,000 mcg total) by mouth    docusate sodium (COLACE) 100 mg Oral Capsule Take 1 Capsule (100 mg total) by mouth    dorzolamide-timoloL (COSOPT) 22.3-6.8 mg/mL Ophthalmic Drops Administer 1 Drop into the left eye Twice daily    ergocalciferol, vitamin D2, (DRISDOL) 1,250 mcg (50,000 unit) Oral Capsule Take 1 Capsule (50,000 Units total) by mouth Every 7 days    fenofibrate (LOFIBRA) 160 mg Oral Tablet Take 1 Tablet (160 mg total) by mouth Once a  day (Patient not taking: Reported on 01/29/2022)    fenofibrate (LOFIBRA) 160 mg Oral Tablet Take 1 Tablet (160 mg total) by mouth Once a day    furosemide (LASIX) 20 mg Oral Tablet Take 1 Tablet (20 mg total) by mouth Once a day    gabapentin (NEURONTIN) 800 mg Oral Tablet TAKE ONE TABLET BY MOUTH THREE TIMES A DAY    gabapentin (NEURONTIN) 800 mg Oral Tablet Take 1 Tablet (800 mg total) by mouth Three times a day    hydroCHLOROthiazide (HYDRODIURIL) 25 mg Oral Tablet TAKE ONE TABLET BY MOUTH EVERY DAY    insulin aspart U-100 (NOVOLOG) 100 unit/mL (3 mL) Subcutaneous Insulin Pen Inject 25 Units under the skin Twice a day before meals 25 units every morning  15 units every evening    insulin glargine (LANTUS SOLOSTAR U-100 INSULIN) 100 unit/mL Subcutaneous Insulin Pen INJECT 95 UNITS AT BEDTIME    insulin glargine 100 unit/mL Subcutaneous injection Inject 35 Units under the skin Every night    insulin  glargine 100 unit/mL Subcutaneous injection Inject 45 Units under the skin Every morning with breakfast for 30 days    iron ps complex-B12-folic acid (POLY-IRON 150 FORTE) 150-25-1 mg-mcg-mg Oral Capsule TAKE TWO CAPSULES BY MOUTH EVERY DAY    L. acidophilus/L. bifidus (LACTOBACILLUS ACIDOPH-L. BIFID ORAL) Take 2 Tablets by mouth (Patient not taking: Reported on 01/29/2022)    lipase-protease-amylase 24,000-76,000 -120,000 unit Oral Capsule, Delayed Release(E.C.) Take 2 Capsules by mouth Twice daily    magnesium oxide (MAG-OX) 400 mg Oral Tablet Take 1 Tablet (400 mg total) by mouth Twice daily    multivitamin with iron Oral Tablet Take 1 Tablet by mouth Once a day    mycophenolate sodium (MYFORTIC) 180 mg Oral Tablet, Delayed Release (E.C.) Take 3 Tablets (540 mg total) by mouth Twice daily    nystatin (MYCOSTATIN) 100,000 unit/gram Cream Apply topically Twice daily    ondansetron (ZOFRAN) 4 mg Oral Tablet Take 1 Tablet (4 mg total) by mouth Every 8 hours as needed for Nausea/Vomiting (Patient not taking: Reported on 01/29/2022)    pantoprazole (PROTONIX) 40 mg Oral Tablet, Delayed Release (E.C.) Take 1 Tablet (40 mg total) by mouth Twice daily    Pen Needle, Disposable, (TRUEPLUS PEN NEEDLE) 31 gauge x 3/16" Needle USE 5 TIMES A DAY    potassium citrate (UROCIT-K) 10 mEq (1,080 mg) Oral Tablet Sustained Release Take 1 Tablet (10 mEq total) by mouth Twice daily with food    pravastatin (PRAVACHOL) 10 mg Oral Tablet TAKE ONE TABLET BY MOUTH EVERY DAY    pravastatin (PRAVACHOL) 40 mg Oral Tablet Take 1 Tablet (40 mg total) by mouth Every evening    prednisoLONE acetate (PRED FORTE) 1 % Ophthalmic Drops, Suspension Instill 1 Drop into both eyes Twice daily    promethazine-dextromethorphan (PHENERGAN-DM) 6.25-15 mg/5 mL Oral Syrup Take 5 mL by mouth Four times a day as needed for Cough (Patient not taking: Reported on 01/29/2022)    SSD 1 % Cream APPLY AS DIRECTED ONCE A DAY TO HEEL AND ANKLE    tacrolimus (PROGRAF) 1 mg  Oral Capsule Take 3 Capsules (3 mg total) by mouth Twice daily    testosterone (ANDROGEL) 20.25 mg/1.25 gram (1.62 %) Transdermal Gel in Metered-dose Pump Place 20.25 mg on the skin Once a day    trimethoprim-sulfamethoxazole (BACTRIM DS) 160-800mg  per tablet Take 1 Tablet (160 mg total) by mouth Twice daily (Patient not taking: Reported on 01/29/2022)    trimethoprim-sulfamethoxazole (BACTRIM) 80-400mg  per  tablet TAKE 1 TABLET 3 TIMES A WEEK    vitamin E 400 unit Oral Capsule Take 1 Capsule (400 Units total) by mouth Once a day        HPI  Chronic Disease Management-AMB  Fu visit    This pt  is for  fu today  of chronic problems   Co feet and ankle  swelling  occasional  exertional sob   Pt has multiple risk factors and has not seen cardiology  in years echo  needed  will get  pt appt  with Dr  Renda Rolls as  he say him years ago     S/p fall   tripped  over dog  and  scratched   left  leg and  knee pain  on  left     Toe  nail on the  right        No new problems per pt however pt has been having low BS    Pt and I discussed low BS and  Adjusting insulin Last aic was 5.9%    fu ckd  3  sees dr Clyde Canterbury  fu  1 year fu    hx of  transplant  dec   2005     [place]    Appts  utd         needs fu with  dr Clyde Canterbury      abdominal pain  9/18 EGD Dr. Cheri Kearns, antral ulcer 2016 nl HIDA 2017 nl abd ultrasound x for FLD    Anemia   fu  labs       atrial fibrillation  pt reports that took coumadin after transplant and recalls 2 yrs of heparin before hand when on dialysis. Pt thinks took coumadin for 9-12 mo after transplant but pt does not recal  Nothing  done  since        cyst of kidney  1/20 pt contacted transplat team, they are not concerned about the 8 cm cyst at the time, pt scheduled for fu for Feb 5th 1/20 8 cm seen on transplanted kidney, sending to transplant team, need to verify that this is not worrisome    diabetes mellitus type 2  Pt has been in  [place] control but  Too much control  Pt's Aic 5.9%   Pt and I discussed how detrimental low BS can be   Insulin needs to be adjusted   Pt brings up medication oral agents he has been on in the past and he would like to be off insulin and oral agents only   However as he has had a renal transplant and can not take medications he was on in the past       diabetic peripheral neuropathy    essential hypertension  bp stable here and at home    gastroesophageal reflux disease currently  stable     glaucoma  2020 pt has fu at [facility] severe Right, followed at [facility], last visit 5/17 sees every 11 mo.    Hx of his Herpes simplex    history of renal transplant left   dec   2005  [facility]    1/20 pt has fu Feb 5th 12/19 Cr 1.69 GFR 45 6/19 Cr 1.41 GFR 56 3/19 Cr 1.67 GFR 46 11/18 seen by Dr. Clyde Canterbury, renal fx worsening, no comment on cause 11/18 Cr 1.65 GFR 47    history of transplantation of pancreas  12/19  Tac level nl 8.4 amylase and lipse nl PK txp 2005, pt reports had renal failure and was on HD, qualified for ki tp. 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    hyperlipidemia  Stable on fenofibrate and statin     legal blindness    obesity  12/18 pt would like referral for bariatric surgery, would like to be seen by [facility] because of his hx of transplant    obstructive sleep apnea syndrome  4/18 compliant with CPAP, using nasal pillow, MILD OSA 12/17 mild, will f/u with sleep med for CPAP, may be cause of fatigue (rather than low T)    referred to dr Catha Nottingham   and following  cpap    peptic ulcer  9/18 seen by gen surg/[facility], EGD and sono pending 11/17 pathology report from EGD and bx shows gastric ulcer, no neoplasm, H pylori negative 3/18 will need to decide on f/u EGD with Dr. Ayesha Rumpf hx of Hiatal hernia    polyp of colon  had cscope 5/17, bx showed melanosis coli only, no colitis    prostate specific antigen abnormal  3/19 PSA 1.5 PSA rise 2016 to 2017 on testosterone, followed by urology/[place], last seen 3/18    testicular  hypofunction  5/18 followed by urology, they will follow PSA and testosterone levels prescribed gel 5/18 PSA 1.4 (slightly lower than last) followed by urology 5/18 PSA velocity increase  Pt needs  refill on medication   lab  needed     Vitamin D deficiency  On supplement      REVIEW OF SYSTEMS:   Review of Systems  General: No fever.  No chills.  No weight changes.  HEENT: No vision changes.  Cardiac: No chest pain. No palpitations.  No dizziness.  No light-headedness.  No near syncope.  Resp: No dyspnea at rest, no dyspnea on exertion; no cough or hemoptysis; no orthopnea or PND.  GI: No N/V. No melena.  No bright red blood per rectum.  Ext: No edema.  No claudication.  Neuro: No focal weakness.  No numbness.  All other ROS negative.      Objective:   BP (!) 140/82 (Site: Right, Patient Position: Sitting)   Pulse 79   Temp 36.8 C (98.2 F) (Tympanic)   Ht 1.854 m (6\' 1" )   Wt (!) 154 kg (339 lb 6.4 oz)   SpO2 96%   BMI 44.78 kg/m              PHYSICAL EXAM  Physical Exam  Gen: NAD. Alert.   HEENT: PERRL; conjunctivae clear. No JVD or carotid bruit.  Cardiac: RRR with normal S1, S2.   Lungs: Clear to auscultation bilaterally. No rales. No wheezing. No rhonchi.  Abdomen: Soft, non-tender.non-distended  nl bowel sounds    Extremities: No edema. No cyanosis. No clubbing.  Neurologic:  Grossly intact  Avulsion of toe nail on the  right  great  toe  no redness area cleaned with saline and  silvadene  applied     Abrasion on left  anterior  shin with  redness around  and  area cleaned + odor  and pus discharge  area cleaned with saline  and  silvadene  applied  and dressing    Lab Results   Component Value Date    CHOLESTEROL 176 05/09/2022    HDLCHOL 53 05/09/2022    LDLCHOL 98 05/09/2022    TRIG 127 05/09/2022       COMPLETE  BLOOD COUNT   Lab Results   Component Value Date    WBC 6.4 05/09/2022    HGB 10.9 (L) 05/09/2022    HCT 32.3 (L) 05/09/2022    PLTCNT 245 05/09/2022    BANDS 3 (L) 09/09/2021        DIFFERENTIAL  Lab Results   Component Value Date    PMNS 65 05/09/2022    LYMPHOCYTES 25 05/09/2022    MYELOCYTES 3 09/08/2021    MONOCYTES 8 05/09/2022    EOSINOPHIL 3 05/09/2022    BASOPHILS 0 05/09/2022    BASOPHILS 0.00 05/09/2022    PMNABS 4.20 05/09/2022    LYMPHSABS 1.60 05/09/2022    EOSABS 0.20 05/09/2022    MONOSABS 0.50 05/09/2022        COMPREHENSIVE METABOLIC PANEL   Lab Results   Component Value Date    SODIUM 141 05/09/2022    POTASSIUM 4.4 05/09/2022    CHLORIDE 107 05/09/2022    CO2 26 05/09/2022    ANIONGAP 8 05/09/2022    BUN 26 (H) 05/09/2022    CREATININE 1.96 (H) 05/09/2022    GLUCOSENF 103 05/09/2022    CALCIUM 9.5 05/09/2022    ALBUMIN 4.0 05/09/2022    TOTALPROTEIN 6.7 05/09/2022    ALKPHOS 39 05/09/2022    AST 17 05/09/2022    ALT 17 05/09/2022            THYROID STIMULATING HORMONE  Lab Results   Component Value Date    TSH 3.716 05/09/2022        Lab Results   Component Value Date    HA1C 5.6 05/09/2022     Lab Results   Component Value Date    VITD 46 05/09/2022         Assessment/Plan  Problem List Items Addressed This Visit          Neurologic    Diabetic peripheral neuropathy (CMS HCC)    Relevant Orders    HGA1C (HEMOGLOBIN A1C WITH EST AVG GLUCOSE) (Completed)    VITAMIN D 25 TOTAL (Completed)    PSA DIAGNOSTIC WITH FREE PSA REFLEX (Completed)    CBC/DIFF (Completed)    COMPREHENSIVE METABOLIC PNL, FASTING (Completed)    THYROID STIMULATING HORMONE (SENSITIVE TSH) (Completed)    LIPID PANEL (Completed)       Nephrology    CKD (chronic kidney disease) stage 3, GFR 30-59 ml/min (CMS HCC)    Relevant Orders    HGA1C (HEMOGLOBIN A1C WITH EST AVG GLUCOSE) (Completed)    VITAMIN D 25 TOTAL (Completed)    PSA DIAGNOSTIC WITH FREE PSA REFLEX (Completed)    CBC/DIFF (Completed)    COMPREHENSIVE METABOLIC PNL, FASTING (Completed)    THYROID STIMULATING HORMONE (SENSITIVE TSH) (Completed)    LIPID PANEL (Completed)       Endocrine    Stable proliferative diabetic retinopathy of both eyes  associated with type 2 diabetes mellitus (CMS HCC)    Relevant Orders    HGA1C (HEMOGLOBIN A1C WITH EST AVG GLUCOSE) (Completed)    VITAMIN D 25 TOTAL (Completed)    PSA DIAGNOSTIC WITH FREE PSA REFLEX (Completed)    CBC/DIFF (Completed)    COMPREHENSIVE METABOLIC PNL, FASTING (Completed)    THYROID STIMULATING HORMONE (SENSITIVE TSH) (Completed)    LIPID PANEL (Completed)       Hematologic/Lymphatic    Immunosuppression (CMS HCC)    Relevant Orders    HGA1C (HEMOGLOBIN A1C WITH EST AVG GLUCOSE) (Completed)    VITAMIN D 25 TOTAL (Completed)  PSA DIAGNOSTIC WITH FREE PSA REFLEX (Completed)    CBC/DIFF (Completed)    COMPREHENSIVE METABOLIC PNL, FASTING (Completed)    THYROID STIMULATING HORMONE (SENSITIVE TSH) (Completed)    LIPID PANEL (Completed)       Dermatology    Cellulitis    Relevant Orders    HGA1C (HEMOGLOBIN A1C WITH EST AVG GLUCOSE) (Completed)    VITAMIN D 25 TOTAL (Completed)    PSA DIAGNOSTIC WITH FREE PSA REFLEX (Completed)    CBC/DIFF (Completed)    COMPREHENSIVE METABOLIC PNL, FASTING (Completed)    THYROID STIMULATING HORMONE (SENSITIVE TSH) (Completed)    LIPID PANEL (Completed)       Other    History of pancreas transplant (CMS HCC)    Relevant Orders    HGA1C (HEMOGLOBIN A1C WITH EST AVG GLUCOSE) (Completed)    VITAMIN D 25 TOTAL (Completed)    PSA DIAGNOSTIC WITH FREE PSA REFLEX (Completed)    CBC/DIFF (Completed)    COMPREHENSIVE METABOLIC PNL, FASTING (Completed)    THYROID STIMULATING HORMONE (SENSITIVE TSH) (Completed)    LIPID PANEL (Completed)    Class 3 severe obesity due to excess calories with serious comorbidity and body mass index (BMI) of 45.0 to 49.9 in adult (CMS HCC)    Relevant Orders    HGA1C (HEMOGLOBIN A1C WITH EST AVG GLUCOSE) (Completed)    VITAMIN D 25 TOTAL (Completed)    PSA DIAGNOSTIC WITH FREE PSA REFLEX (Completed)    CBC/DIFF (Completed)    COMPREHENSIVE METABOLIC PNL, FASTING (Completed)    THYROID STIMULATING HORMONE (SENSITIVE TSH) (Completed)    LIPID PANEL  (Completed)     Other Visit Diagnoses       Type 2 diabetes mellitus (CMS HCC)        Relevant Orders    HGA1C (HEMOGLOBIN A1C WITH EST AVG GLUCOSE) (Completed)    VITAMIN D 25 TOTAL (Completed)    PSA DIAGNOSTIC WITH FREE PSA REFLEX (Completed)    CBC/DIFF (Completed)    COMPREHENSIVE METABOLIC PNL, FASTING (Completed)    THYROID STIMULATING HORMONE (SENSITIVE TSH) (Completed)    LIPID PANEL (Completed)    Edema, unspecified type        Relevant Orders    Referral to External Provider (AMB)    HGA1C (HEMOGLOBIN A1C WITH EST AVG GLUCOSE) (Completed)    VITAMIN D 25 TOTAL (Completed)    PSA DIAGNOSTIC WITH FREE PSA REFLEX (Completed)    CBC/DIFF (Completed)    COMPREHENSIVE METABOLIC PNL, FASTING (Completed)    THYROID STIMULATING HORMONE (SENSITIVE TSH) (Completed)    LIPID PANEL (Completed)    Exertional dyspnea        Relevant Orders    Referral to External Provider (AMB)    HGA1C (HEMOGLOBIN A1C WITH EST AVG GLUCOSE) (Completed)    VITAMIN D 25 TOTAL (Completed)    PSA DIAGNOSTIC WITH FREE PSA REFLEX (Completed)    CBC/DIFF (Completed)    COMPREHENSIVE METABOLIC PNL, FASTING (Completed)    THYROID STIMULATING HORMONE (SENSITIVE TSH) (Completed)    LIPID PANEL (Completed)    BPH associated with nocturia        Relevant Orders    PSA DIAGNOSTIC WITH FREE PSA REFLEX (Completed)             Orders Placed This Encounter    HGA1C (HEMOGLOBIN A1C WITH EST AVG GLUCOSE)    VITAMIN D 25 TOTAL    PSA DIAGNOSTIC WITH FREE PSA REFLEX    CBC/DIFF    COMPREHENSIVE METABOLIC PNL, FASTING  THYROID STIMULATING HORMONE (SENSITIVE TSH)    LIPID PANEL    CBC WITH DIFF    Referral to External Provider (AMB)    CANCELED: TRANSTHORACIC ECHOCARDIOGRAM - ADULT    amLODIPine (NORVASC) 5 mg Oral Tablet    atenoloL (TENORMIN) 50 mg Oral Tablet    carvediloL (COREG) 25 mg Oral Tablet    cholecalciferol, vitamin D3, 1,250 mcg (50,000 unit) Oral Capsule    fenofibrate (LOFIBRA) 160 mg Oral Tablet    gabapentin (NEURONTIN) 800 mg Oral Tablet     insulin glargine (LANTUS SOLOSTAR U-100 INSULIN) 100 unit/mL Subcutaneous Insulin Pen    lipase-protease-amylase 24,000-76,000 -120,000 unit Oral Capsule, Delayed Release(E.C.)    mycophenolate sodium (MYFORTIC) 180 mg Oral Tablet, Delayed Release (E.C.)    pantoprazole (PROTONIX) 40 mg Oral Tablet, Delayed Release (E.C.)    potassium citrate (UROCIT-K) 10 mEq (1,080 mg) Oral Tablet Sustained Release    pravastatin (PRAVACHOL) 40 mg Oral Tablet    Pen Needle, Disposable, (TRUEPLUS PEN NEEDLE) 31 gauge x 3/16" Needle    cephalexin (KEFLEX) 500 mg Oral Capsule    insulin aspart U-100 (NOVOLOG) 100 unit/mL (3 mL) Subcutaneous Insulin Pen    furosemide (LASIX) 20 mg Oral Tablet        Meds reviewed as well as labs.  Chart reviewed and updated.   Continue current treatment.  Keep follow-up appointment.   Vaccine hx reviewed.   All meds sent    Chart discussed   Yearly  fu with transplant team     Keflex added  for  cellulitis   clear  wound on left  leg bid    Labs in fu  today     Referral to Dr  Viann Shove  pt saw him  yrs ago   but doe to edema and sob  will do referral   Wounds were cleaned and dressed    pt tolerated  the cleaning and dressing   On the day of the encounter, a total of  45 minutes was spent on this patient encounter including review of historical information, examination, documentation and post-visit activities.

## 2022-05-10 LAB — PSA DIAGNOSTIC WITH FREE PSA REFLEX: PSA: 1.39 ng/mL (ref <=4.00)

## 2022-05-12 ENCOUNTER — Other Ambulatory Visit (INDEPENDENT_AMBULATORY_CARE_PROVIDER_SITE_OTHER): Payer: Self-pay | Admitting: Internal Medicine

## 2022-05-12 DIAGNOSIS — Z9483 Pancreas transplant status: Secondary | ICD-10-CM

## 2022-05-12 DIAGNOSIS — E782 Mixed hyperlipidemia: Secondary | ICD-10-CM

## 2022-05-12 MED ORDER — PRAVASTATIN 20 MG TABLET
20.0000 mg | ORAL_TABLET | Freq: Every evening | ORAL | 1 refills | Status: DC
Start: 2022-05-12 — End: 2023-01-01

## 2022-05-14 ENCOUNTER — Telehealth (INDEPENDENT_AMBULATORY_CARE_PROVIDER_SITE_OTHER): Payer: Self-pay | Admitting: Internal Medicine

## 2022-05-16 ENCOUNTER — Telehealth (INDEPENDENT_AMBULATORY_CARE_PROVIDER_SITE_OTHER): Payer: Self-pay | Admitting: Internal Medicine

## 2022-05-22 ENCOUNTER — Telehealth (INDEPENDENT_AMBULATORY_CARE_PROVIDER_SITE_OTHER): Payer: Self-pay | Admitting: Internal Medicine

## 2022-05-27 IMAGING — MR MRI BRAIN W/O CONTRAST
8 series · 48 of 48 positions shown · non-contrast
Comparison: None available.

﻿EXAM:  MRI BRAIN W/O CONTRAST
INDICATION: 56-year-old with visual disturbance on the right side.  No history of trauma or craniotomy.
TECHNIQUE: Multiplanar, multisequential MRI of the brain was performed without administration of contrast.

[Series 5: DWI · axial · 5.0mm · 1.35mm/px · z∈[-28,+98]mm · 16 of 88 slices shown (1 of 3)]
[im 1/88]
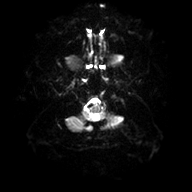
[im 6/88]
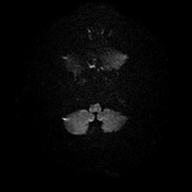
[im 12/88]
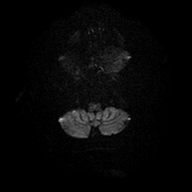
[im 18/88]
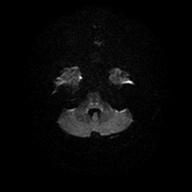
[im 24/88]
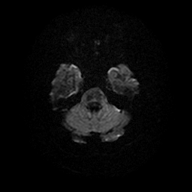
[im 30/88]
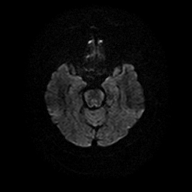
[im 35/88]
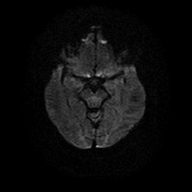
[im 41/88]
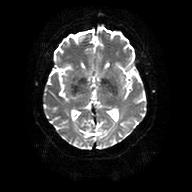
[im 47/88]
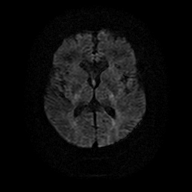
[im 53/88]
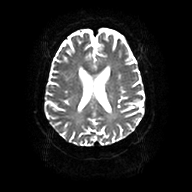
[im 59/88]
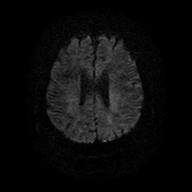
[im 64/88]
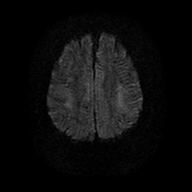
[im 70/88]
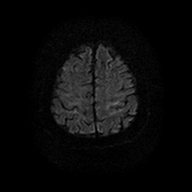
[im 76/88]
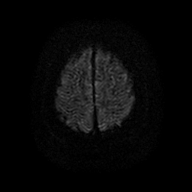
[im 82/88]
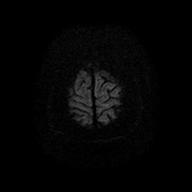
[im 88/88]
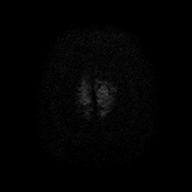

[Series 6: DWI · axial · 5.0mm · 1.35mm/px · z∈[-28,+98]mm · 4 of 22 slices shown (2 of 3)]
[im 1/22]
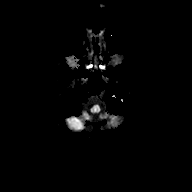
[im 8/22]
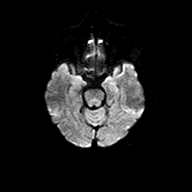
[im 15/22]
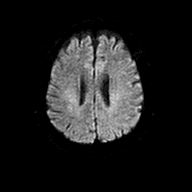
[im 22/22]
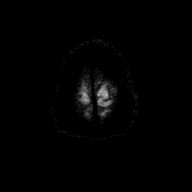

[Series 7: DWI · axial · 5.0mm · 1.35mm/px · z∈[-28,+98]mm · 4 of 21 slices shown (3 of 3)]
[im 1/21]
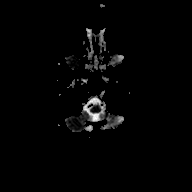
[im 7/21]
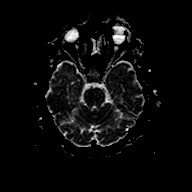
[im 14/21]
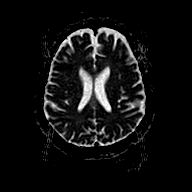
[im 21/21]
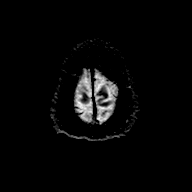

[Series 8: FLAIR · sagittal · 4.0mm · 0.75mm/px · 5 of 26 slices shown (1 of 2)]
[im 1/26]
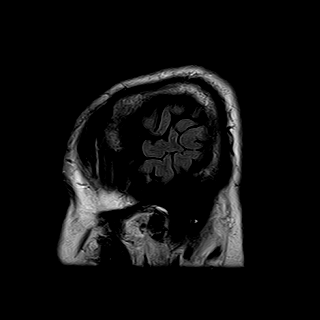
[im 7/26]
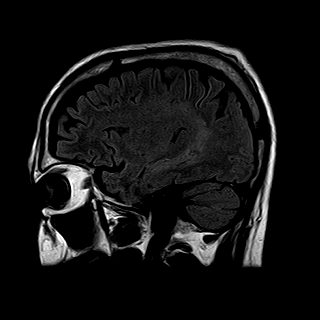
[im 13/26]
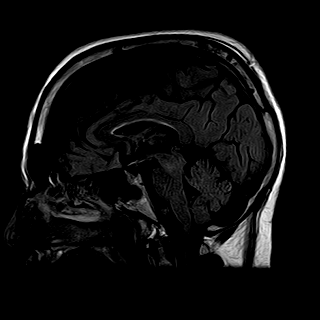
[im 19/26]
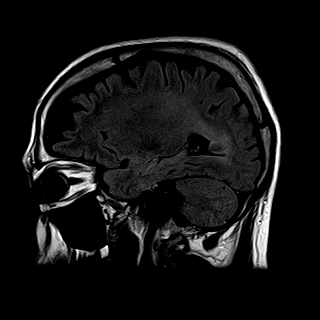
[im 26/26]
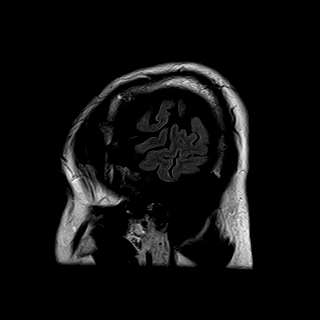

[Series 9: T2 · axial · 5.0mm · 0.43mm/px · z∈[-38,+106]mm · 5 of 25 slices shown]
[im 1/25]
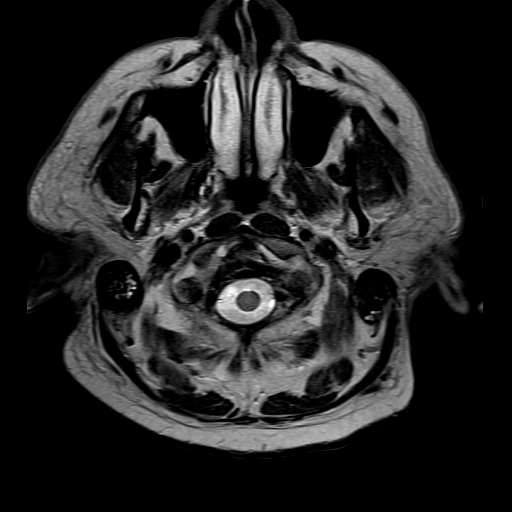
[im 7/25]
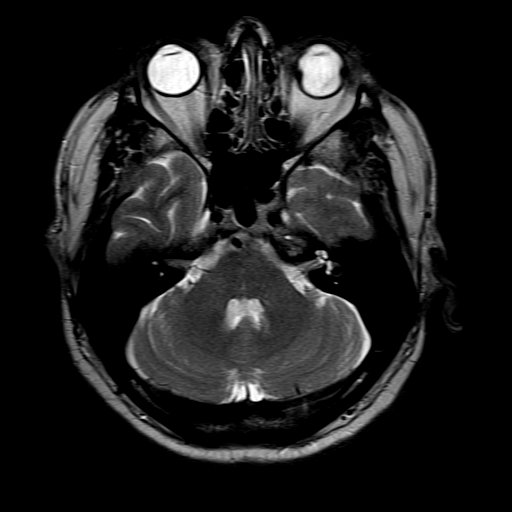
[im 13/25]
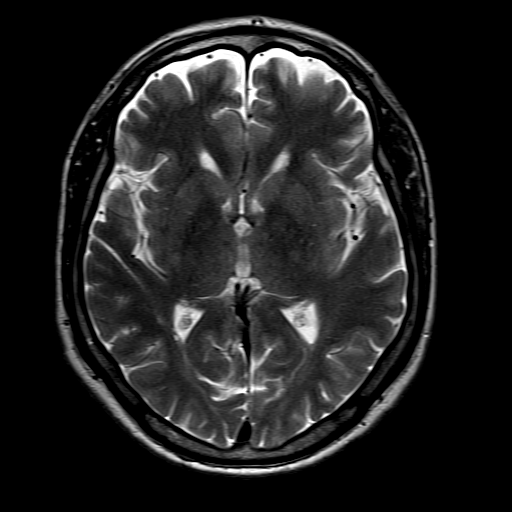
[im 19/25]
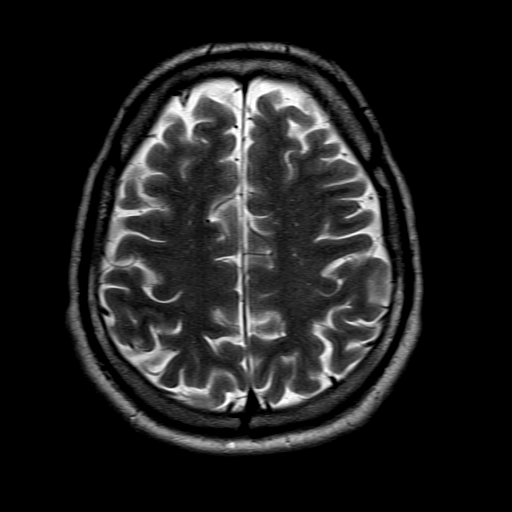
[im 25/25]
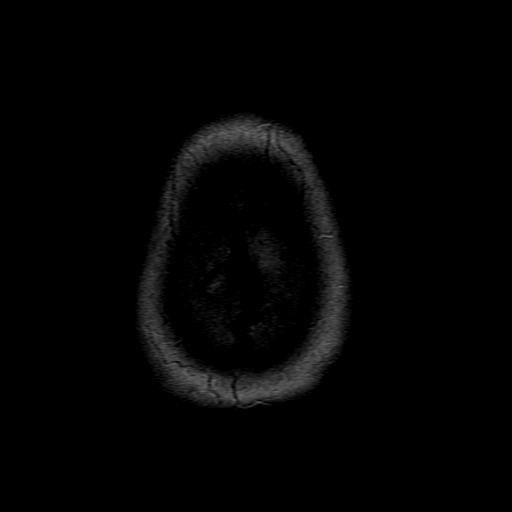

[Series 10: FLAIR · axial · 5.0mm · 0.76mm/px · z∈[-29,+97]mm · 4 of 22 slices shown (2 of 2)]
[im 1/22]
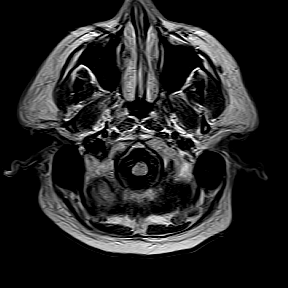
[im 8/22]
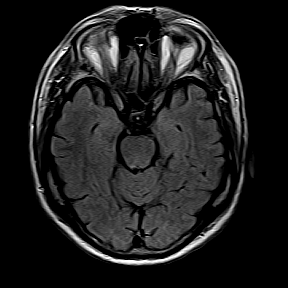
[im 15/22]
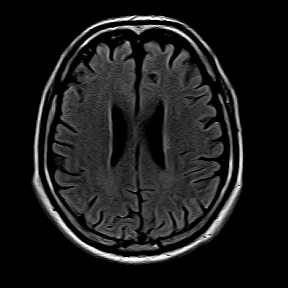
[im 22/22]
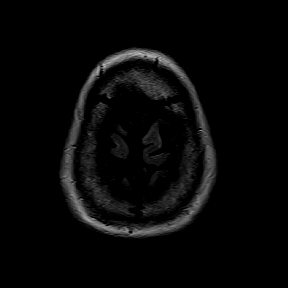

[Series 11: T1 · axial · 5.0mm · 0.69mm/px · z∈[-72,+72]mm · 5 of 25 slices shown]
[im 1/25]
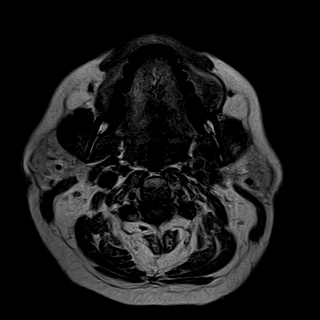
[im 7/25]
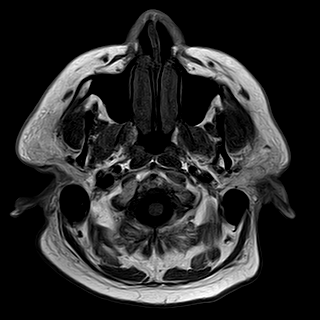
[im 13/25]
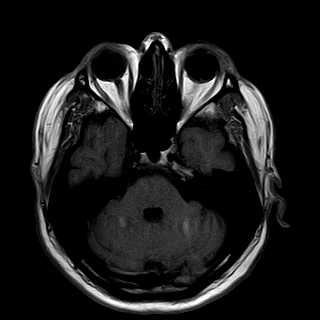
[im 19/25]
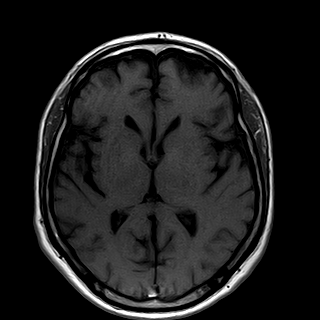
[im 25/25]
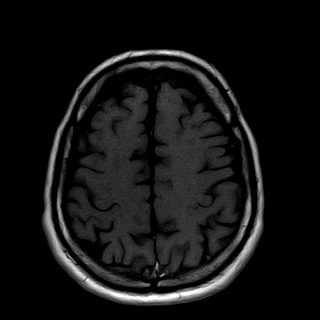

[Series 12: T2-star · axial · 5.0mm · 0.69mm/px · z∈[-38,+106]mm · 5 of 25 slices shown]
[im 1/25]
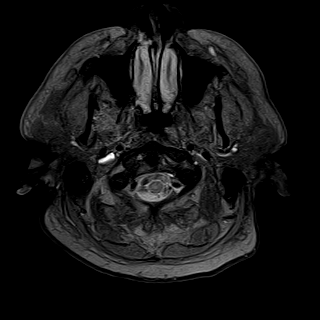
[im 7/25]
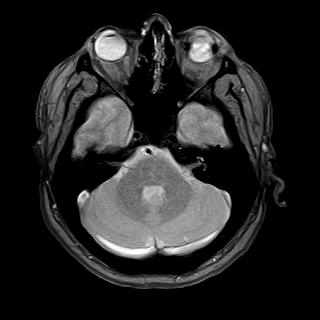
[im 13/25]
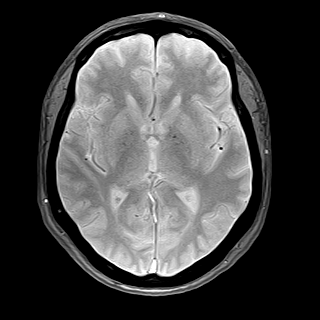
[im 19/25]
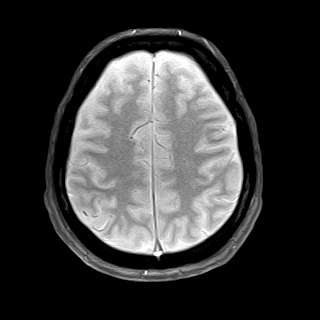
[im 25/25]
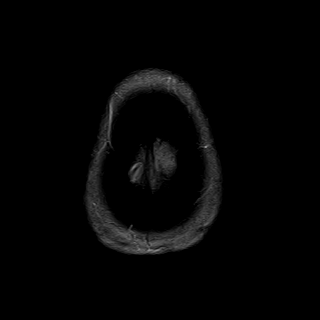

[48 of 48 positions shown; findings below may reference images not displayed]

FINDINGS: No acute ischemic change on the diffusion sequence.  No intracranial bleed or extra-axial collections are seen.

Axial and sagittal FLAIR images show no evidence of chronic ischemic change or demyelinating process.  No evidence of sellar or suprasellar mass is noted.  Pituitary infundibulum is in the midline.  Major vascular structures of circle of Willis are patent.
IMPRESSION: 1. No evidence of acute ischemia, chronic ischemia or demyelinating process is noted.

2. No intracranial space-occupying lesions, ventriculomegaly are seen.

3. No focal lesions of the pituitary fossa or suprasellar region are seen.

4. Mild inflammatory changes of ethmoid sinuses on both sides.

## 2022-06-13 ENCOUNTER — Other Ambulatory Visit (INDEPENDENT_AMBULATORY_CARE_PROVIDER_SITE_OTHER): Payer: Self-pay | Admitting: Internal Medicine

## 2022-06-18 ENCOUNTER — Other Ambulatory Visit (INDEPENDENT_AMBULATORY_CARE_PROVIDER_SITE_OTHER): Payer: Self-pay | Admitting: Internal Medicine

## 2022-06-18 MED ORDER — HYDROCHLOROTHIAZIDE 25 MG TABLET
25.0000 mg | ORAL_TABLET | Freq: Every day | ORAL | 2 refills | Status: DC
Start: 2022-06-18 — End: 2022-07-16

## 2022-06-18 MED ORDER — MAGNESIUM OXIDE 400 MG (241.3 MG MAGNESIUM) TABLET
400.0000 mg | ORAL_TABLET | Freq: Two times a day (BID) | ORAL | 2 refills | Status: DC
Start: 2022-06-18 — End: 2022-09-11

## 2022-06-18 MED ORDER — FUROSEMIDE 20 MG TABLET
20.0000 mg | ORAL_TABLET | Freq: Every day | ORAL | 0 refills | Status: DC
Start: 2022-06-18 — End: 2022-07-17

## 2022-07-04 ENCOUNTER — Other Ambulatory Visit (INDEPENDENT_AMBULATORY_CARE_PROVIDER_SITE_OTHER): Payer: Self-pay | Admitting: Internal Medicine

## 2022-07-10 ENCOUNTER — Other Ambulatory Visit (INDEPENDENT_AMBULATORY_CARE_PROVIDER_SITE_OTHER): Payer: Self-pay | Admitting: Internal Medicine

## 2022-07-15 ENCOUNTER — Ambulatory Visit (INDEPENDENT_AMBULATORY_CARE_PROVIDER_SITE_OTHER): Payer: Self-pay | Admitting: Internal Medicine

## 2022-07-16 ENCOUNTER — Other Ambulatory Visit: Payer: Self-pay

## 2022-07-16 ENCOUNTER — Ambulatory Visit: Payer: Medicare Other | Attending: Internal Medicine | Admitting: Internal Medicine

## 2022-07-16 ENCOUNTER — Encounter (INDEPENDENT_AMBULATORY_CARE_PROVIDER_SITE_OTHER): Payer: Self-pay | Admitting: Internal Medicine

## 2022-07-16 DIAGNOSIS — E1122 Type 2 diabetes mellitus with diabetic chronic kidney disease: Secondary | ICD-10-CM

## 2022-07-16 DIAGNOSIS — E119 Type 2 diabetes mellitus without complications: Secondary | ICD-10-CM

## 2022-07-16 DIAGNOSIS — E782 Mixed hyperlipidemia: Secondary | ICD-10-CM

## 2022-07-16 DIAGNOSIS — N1831 Chronic kidney disease, stage 3a (CMS HCC): Secondary | ICD-10-CM

## 2022-07-16 DIAGNOSIS — I480 Paroxysmal atrial fibrillation: Secondary | ICD-10-CM

## 2022-07-16 DIAGNOSIS — K219 Gastro-esophageal reflux disease without esophagitis: Secondary | ICD-10-CM

## 2022-07-16 MED ORDER — POTASSIUM CITRATE ER 10 MEQ (1,080 MG) TABLET,EXTENDED RELEASE
10.0000 meq | ORAL_TABLET | Freq: Two times a day (BID) | ORAL | 4 refills | Status: DC
Start: 2022-07-16 — End: 2022-08-28

## 2022-07-16 MED ORDER — HYDROCHLOROTHIAZIDE 25 MG TABLET
25.0000 mg | ORAL_TABLET | Freq: Every day | ORAL | 4 refills | Status: DC
Start: 2022-07-16 — End: 2022-08-28

## 2022-07-16 MED ORDER — SULFAMETHOXAZOLE 400 MG-TRIMETHOPRIM 80 MG TABLET
ORAL_TABLET | ORAL | 4 refills | Status: DC
Start: 2022-07-16 — End: 2023-12-07

## 2022-07-16 MED ORDER — PANTOPRAZOLE 40 MG TABLET,DELAYED RELEASE
40.0000 mg | DELAYED_RELEASE_TABLET | Freq: Two times a day (BID) | ORAL | 4 refills | Status: DC
Start: 2022-07-16 — End: 2023-03-10

## 2022-07-16 MED ORDER — FAMOTIDINE 40 MG TABLET
40.0000 mg | ORAL_TABLET | Freq: Two times a day (BID) | ORAL | 4 refills | Status: DC
Start: 2022-07-16 — End: 2023-05-14

## 2022-07-16 MED ORDER — AMLODIPINE 5 MG TABLET
5.0000 mg | ORAL_TABLET | Freq: Two times a day (BID) | ORAL | 4 refills | Status: DC
Start: 2022-07-16 — End: 2023-03-10

## 2022-07-16 MED ORDER — CARVEDILOL 25 MG TABLET
25.0000 mg | ORAL_TABLET | Freq: Two times a day (BID) | ORAL | 4 refills | Status: DC
Start: 2022-07-16 — End: 2023-01-19

## 2022-07-16 MED ORDER — ATENOLOL 50 MG TABLET
50.0000 mg | ORAL_TABLET | Freq: Two times a day (BID) | ORAL | 4 refills | Status: DC
Start: 2022-07-16 — End: 2023-01-13

## 2022-07-16 MED ORDER — CHOLECALCIFEROL (VITAMIN D3) 1,250 MCG (50,000 UNIT) CAPSULE
50000.0000 [IU] | ORAL_CAPSULE | ORAL | 4 refills | Status: DC
Start: 2022-07-16 — End: 2023-02-26

## 2022-07-16 MED ORDER — LIPASE-PROTEASE-AMYLASE 24,000-76,000-120,000 UNIT CAPSULE,DELAYED REL
2.0000 | DELAYED_RELEASE_CAPSULE | Freq: Two times a day (BID) | ORAL | 4 refills | Status: DC
Start: 2022-07-16 — End: 2022-08-28

## 2022-07-16 NOTE — Progress Notes (Signed)
Name: Martin Porter                       Date of Birth: [birth year]   MRN:  [id number]                         Date of visit: 07/16/2022     PCP: Delaney Meigs, PA-C     Subjective  Martin Porter is a 56 y.o. year old male who presents for Follow Up (Follow up no problems )   to clinic.  No specialty comments available.   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 12/17 MILD, WILL FOLLOW UP WITH SLEEP MED FOR CPAP MAY  BE BECAUSE FATIGUE      Candida infection 07/15/2021      Current Outpatient Medications   Medication Sig    acetaminophen (TYLENOL) 500 mg Oral Tablet Take 1 Tablet (500 mg total) by mouth Every 4 hours as needed for Pain    amLODIPine (NORVASC) 5 mg Oral Tablet Take 1 Tablet (5 mg total) by mouth Twice daily    aspirin (ECOTRIN) 81 mg Oral Tablet, Delayed Release (E.C.) Take 1 Tablet (81 mg total) by mouth    atenoloL (TENORMIN) 50 mg Oral Tablet Take 1 Tablet (50 mg  total) by mouth Twice daily    atropine (ISOPTO ATROPINE) 1 % Ophthalmic Drops 1 Drop Four times a day    carvediloL (COREG) 25 mg Oral Tablet Take 1 Tablet (25 mg total) by mouth Twice daily with food    cholecalciferol, vitamin D3, 1,250 mcg (50,000 unit) Oral Capsule Take 1 Capsule (50,000 Units total) by mouth Every 7 days    cyanocobalamin (VITAMIN B 12) 1,000 mcg Oral Tablet Take 2 Tablets (2,000 mcg total) by mouth    docusate sodium (COLACE) 100 mg Oral Capsule Take 1 Capsule (100 mg total) by mouth    dorzolamide-timoloL (COSOPT) 22.3-6.8 mg/mL Ophthalmic Drops Administer 1 Drop into the left eye Twice daily    ergocalciferol, vitamin D2, (DRISDOL) 1,250 mcg (50,000 unit) Oral Capsule Take 1 Capsule (50,000 Units total) by mouth Every 7 days    famotidine (PEPCID) 40 mg Oral Tablet Take 1 Tablet (40 mg total) by mouth Twice daily    fenofibrate (LOFIBRA) 160 mg Oral Tablet Take 1 Tablet (160 mg total) by mouth Once a day  fenofibrate (LOFIBRA) 160 mg Oral Tablet Take 1 Tablet (160 mg total) by mouth Once a day    furosemide (LASIX) 20 mg Oral Tablet TAKE ONE TABLET BY MOUTH EVERY DAY    furosemide (LASIX) 20 mg Oral Tablet Take 1 Tablet (20 mg total) by mouth Once a day    gabapentin (NEURONTIN) 800 mg Oral Tablet TAKE ONE TABLET BY MOUTH THREE TIMES A DAY    gabapentin (NEURONTIN) 800 mg Oral Tablet Take 1 Tablet (800 mg total) by mouth Three times a day    hydroCHLOROthiazide (HYDRODIURIL) 25 mg Oral Tablet Take 1 Tablet (25 mg total) by mouth Once a day    insulin glargine (LANTUS SOLOSTAR U-100 INSULIN) 100 unit/mL Subcutaneous Insulin Pen INJECT 95 UNITS AT BEDTIME    insulin glargine 100 unit/mL Subcutaneous injection Inject 35 Units under the skin Every night    insulin glargine 100 unit/mL Subcutaneous injection Inject 45 Units under the skin Every morning with breakfast for 30 days    iron ps complex-B12-folic acid (POLY-IRON Q000111Q FORTE) 150-25-1 mg-mcg-mg Oral Capsule TAKE TWO CAPSULES BY MOUTH  EVERY DAY    L. acidophilus/L. bifidus (LACTOBACILLUS ACIDOPH-L. BIFID ORAL) Take 2 Tablets by mouth    lipase-protease-amylase 24,000-76,000 -120,000 unit Oral Capsule, Delayed Release(E.C.) Take 2 Capsules by mouth Twice daily    magnesium oxide (MAG-OX) 400 mg Oral Tablet Take 1 Tablet (400 mg total) by mouth Twice daily    multivitamin with iron Oral Tablet Take 1 Tablet by mouth Once a day    mycophenolate sodium (MYFORTIC) 180 mg Oral Tablet, Delayed Release (E.C.) Take 3 Tablets (540 mg total) by mouth Twice daily    NOVOLOG FLEXPEN U-100 INSULIN 100 unit/mL (3 mL) Subcutaneous Insulin Pen INJECT 80 UNITS ONCE A DAY FOR 90 DAYS    nystatin (MYCOSTATIN) 100,000 unit/gram Cream Apply topically Twice daily    ondansetron (ZOFRAN) 4 mg Oral Tablet Take 1 Tablet (4 mg total) by mouth Every 8 hours as needed for Nausea/Vomiting    pantoprazole (PROTONIX) 40 mg Oral Tablet, Delayed Release (E.C.) Take 1 Tablet (40 mg total) by mouth Twice daily    Pen Needle, Disposable, (TRUEPLUS PEN NEEDLE) 31 gauge x 3/16" Needle USE 5 TIMES A DAY    potassium citrate (UROCIT-K) 10 mEq (1,080 mg) Oral Tablet Sustained Release Take 1 Tablet (10 mEq total) by mouth Twice daily with food    pravastatin (PRAVACHOL) 20 mg Oral Tablet Take 1 Tablet (20 mg total) by mouth Every evening    prednisoLONE acetate (PRED FORTE) 1 % Ophthalmic Drops, Suspension Instill 1 Drop into both eyes Twice daily    promethazine-dextromethorphan (PHENERGAN-DM) 6.25-15 mg/5 mL Oral Syrup Take 5 mL by mouth Four times a day as needed for Cough    SSD 1 % Cream APPLY AS DIRECTED ONCE A DAY TO HEEL AND ANKLE    tacrolimus (PROGRAF) 1 mg Oral Capsule TAKE THREE CAPSULES BY MOUTH TWICE A DAY FOR KIDNEY TRANSPLANT    testosterone (ANDROGEL) 20.25 mg/1.25 gram (1.62 %) Transdermal Gel in Metered-dose Pump Place 20.25 mg on the skin Once a day    trimethoprim-sulfamethoxazole (BACTRIM DS) 160-'800mg'$  per tablet Take 1 Tablet (160 mg total) by mouth Twice daily     trimethoprim-sulfamethoxazole (BACTRIM) 80-'400mg'$  per tablet TAKE 1 TABLET 3 TIMES A WEEK    vitamin E 400 unit Oral Capsule Take 1 Capsule (400 Units total) by mouth Once a day            Chronic  Disease Management-AMB  Fu visit    This pt  is for  fu today  of chronic problems     fu ckd  3  sees dr Marcy Panning  fu  1 year fu    hx of  transplant  dec   2005     and  [place]    Appts  utd         needs fu with  dr Marcy Panning      abdominal pain  9/18 EGD Dr. Karenann Cai, antral ulcer 2016 nl HIDA 2017 nl abd ultrasound x for FLD    Anemia   fu  labs       atrial fibrillation  pt reports that took coumadin after transplant and recalls 2 yrs of heparin before hand when on dialysis. Pt thinks took coumadin for 9-12 mo after transplant but pt does not recal  Nothing  done  since        cyst of kidney  1/20 pt contacted transplat team, they are not concerned about the 8 cm cyst at the time, pt scheduled for fu for Feb 5th 1/20 8 cm seen on transplanted kidney, sending to transplant team, need to verify that this is not worrisome    diabetes mellitus type 2  Pt has been in  [place] control but  Too much control  Pt's Aic 5.9%  Pt and I discussed how detrimental low BS can be   Insulin needs to be adjusted   Pt brings up medication oral agents he has been on in the past and he would like to be off insulin and oral agents only   However as he has had a renal transplant and can not take medications he was on in the past       diabetic peripheral neuropathy    essential hypertension  bp stable here and at home    gastroesophageal reflux disease currently  stable     glaucoma  2020 pt has fu at [facility] severe Right, followed at [facility], last visit 5/17 sees every 11 mo.    Hx of his Herpes simplex    history of renal transplant left   dec   2005  [facility]    1/20 pt has fu Feb 5th 12/19 Cr 1.69 GFR 45 6/19 Cr 1.41 GFR 56 3/19 Cr 1.67 GFR 46 11/18 seen by Dr. Marcy Panning, renal fx worsening, no comment on cause  11/18 Cr 1.65 GFR 47    history of transplantation of pancreas  12/19 [place] nl 8.4 amylase and lipse nl PK txp 2005, pt reports had renal failure and was on HD, qualified for ki tp. 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    hyperlipidemia  Stable on fenofibrate and statin     legal blindness    obesity  12/18 pt would like referral for bariatric surgery, would like to be seen by [facility] because of his hx of transplant    obstructive sleep apnea syndrome  4/18 compliant with CPAP, using nasal pillow, MILD OSA 12/17 mild, will f/u with sleep med for CPAP, may be cause of fatigue (rather than low T)    referred to dr Catha Nottingham   and following  cpap    peptic ulcer  9/18 seen by gen surg/[facility], EGD and sono pending 11/17 pathology report from EGD and bx shows gastric ulcer, no neoplasm, H pylori negative 3/18 will need to decide on f/u  EGD with Dr. Pecola Leisure hx of Hiatal hernia    polyp of colon  had cscope 5/17, bx showed melanosis coli only, no colitis    prostate specific antigen abnormal  3/19 PSA 1.5 PSA rise 2016 to 2017 on testosterone, followed by urology/[place], last seen 3/18    testicular hypofunction  5/18 followed by urology, they will follow PSA and testosterone levels prescribed gel 5/18 PSA 1.4 (slightly lower than last) followed by urology 5/18 PSA velocity increase  Pt needs  refill on medication   lab  needed     Vitamin D deficiency  On supplement                REVIEW OF SYSTEMS:   Review of Systems  Review of Systems have been reviewed  ROS  Const  Reports system reviewed and no additional complaints, except as documented  ENT  Reports system reviewed and no additional complaints, except as documented  Resp  Reports system reviewed and no additional complaints, except as documented  GI  Reports system reviewed and no additional complaints, except as documented   Objective:   There were no vitals taken for this visit.             PHYSICAL EXAM  Physical Exam  Gen: NAD. Alert.    Exam  Const  General: cooperative, no acute distress and alert  Resp  Effort & Inspection: able to speak in complete sentences  Psych  Mental Status: mental status grossly normal  Mood: congruent mood  Affect: normal affect  Attitude: cooperative  Insight: insight good  Judgment: judgment good     Assessment/Plan  Problem List Items Addressed This Visit          Cardiovascular System    Mixed hyperlipidemia    Atrial fibrillation (CMS [facility]) - Primary       Nephrology    CKD (chronic kidney disease) stage 3, GFR 30-59 ml/min (CMS [facility])       Digestive    GERD (gastroesophageal reflux disease)       Endocrine    Type 2 diabetes mellitus without complications (CMS [facility])          Orders Placed This Encounter    amLODIPine (NORVASC) 5 mg Oral Tablet    atenoloL (TENORMIN) 50 mg Oral Tablet    carvediloL (COREG) 25 mg Oral Tablet    cholecalciferol, vitamin D3, 1,250 mcg (50,000 unit) Oral Capsule    famotidine (PEPCID) 40 mg Oral Tablet    hydroCHLOROthiazide (HYDRODIURIL) 25 mg Oral Tablet    lipase-protease-amylase 24,000-76,000 -120,000 unit Oral Capsule, Delayed Release(E.C.)    pantoprazole (PROTONIX) 40 mg Oral Tablet, Delayed Release (E.C.)    potassium citrate (UROCIT-K) 10 mEq (1,080 mg) Oral Tablet Sustained Release    trimethoprim-sulfamethoxazole (BACTRIM) 80-400mg  per tablet      Eye appt   and   Transplant  team  WF     Meds reviewed as well as labs.  Chart reviewed and updated.   Continue current treatment.  Keep follow-up appointment.   Vaccine hx reviewed.    Still has not seen dr Renda Rolls  due to making payments       Wounds healed    BS  81 today no low BS  per pt    Feet still swelling   on and off   No sob     Time spent with pt  14 min   I personally offered the service to the patient, and obtained  verbal consent to provide this service.    Delaney Meigs, PA-C

## 2022-07-16 NOTE — Nursing Note (Signed)
Follow up no problems

## 2022-08-12 ENCOUNTER — Ambulatory Visit (INDEPENDENT_AMBULATORY_CARE_PROVIDER_SITE_OTHER): Payer: Self-pay | Admitting: Internal Medicine

## 2022-08-25 ENCOUNTER — Other Ambulatory Visit (INDEPENDENT_AMBULATORY_CARE_PROVIDER_SITE_OTHER): Payer: Self-pay | Admitting: Internal Medicine

## 2022-08-28 ENCOUNTER — Other Ambulatory Visit: Payer: Self-pay

## 2022-08-28 ENCOUNTER — Ambulatory Visit: Payer: Medicare Other | Attending: Internal Medicine | Admitting: Internal Medicine

## 2022-08-28 ENCOUNTER — Encounter (INDEPENDENT_AMBULATORY_CARE_PROVIDER_SITE_OTHER): Payer: Self-pay | Admitting: Internal Medicine

## 2022-08-28 VITALS — BP 130/70 | HR 84 | Temp 97.6°F | Ht 73.0 in | Wt 335.6 lb

## 2022-08-28 DIAGNOSIS — R351 Nocturia: Secondary | ICD-10-CM | POA: Insufficient documentation

## 2022-08-28 DIAGNOSIS — E291 Testicular hypofunction: Secondary | ICD-10-CM | POA: Insufficient documentation

## 2022-08-28 DIAGNOSIS — N401 Enlarged prostate with lower urinary tract symptoms: Secondary | ICD-10-CM | POA: Insufficient documentation

## 2022-08-28 DIAGNOSIS — Z13820 Encounter for screening for osteoporosis: Secondary | ICD-10-CM | POA: Insufficient documentation

## 2022-08-28 DIAGNOSIS — E119 Type 2 diabetes mellitus without complications: Secondary | ICD-10-CM | POA: Insufficient documentation

## 2022-08-28 DIAGNOSIS — E162 Hypoglycemia, unspecified: Secondary | ICD-10-CM

## 2022-08-28 DIAGNOSIS — Z940 Kidney transplant status: Secondary | ICD-10-CM | POA: Insufficient documentation

## 2022-08-28 DIAGNOSIS — I480 Paroxysmal atrial fibrillation: Secondary | ICD-10-CM | POA: Insufficient documentation

## 2022-08-28 DIAGNOSIS — Z0000 Encounter for general adult medical examination without abnormal findings: Secondary | ICD-10-CM | POA: Insufficient documentation

## 2022-08-28 MED ORDER — BASAGLAR KWIKPEN U-100 INSULIN 100 UNIT/ML (3 ML) SUBCUTANEOUS
PEN_INJECTOR | SUBCUTANEOUS | 5 refills | Status: DC
Start: 2022-08-28 — End: 2022-12-12

## 2022-08-28 MED ORDER — POTASSIUM CITRATE ER 10 MEQ (1,080 MG) TABLET,EXTENDED RELEASE
10.0000 meq | ORAL_TABLET | Freq: Two times a day (BID) | ORAL | 5 refills | Status: DC
Start: 2022-08-28 — End: 2023-12-07

## 2022-08-28 MED ORDER — POLY-IRON 150 FORTE 150 MG-25 MCG-1 MG CAPSULE
2.0000 | ORAL_CAPSULE | Freq: Every day | ORAL | 5 refills | Status: DC
Start: 2022-08-28 — End: 2023-06-09

## 2022-08-28 MED ORDER — LIPASE-PROTEASE-AMYLASE 36,000-114,000-180,000 UNIT CAPSULE,DELAY REL
2.0000 | DELAYED_RELEASE_CAPSULE | Freq: Three times a day (TID) | ORAL | 3 refills | Status: DC
Start: 2022-08-28 — End: 2023-01-19

## 2022-08-28 MED ORDER — HYDROCHLOROTHIAZIDE 25 MG TABLET
25.0000 mg | ORAL_TABLET | Freq: Every day | ORAL | 5 refills | Status: DC
Start: 2022-08-28 — End: 2023-07-20

## 2022-08-28 NOTE — Progress Notes (Unsigned)
Name: Martin Porter                       Date of Birth: 16-Nov-1965   MRN:  [id number]                         Date of visit: 08/28/2022     PCP: Samuella Cota, PA-C     Subjective  Martin Porter is a 57 y.o. year old male who presents for Follow Up 3 Months (No problems)   to clinic.  No specialty comments available.   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 12/17 MILD, WILL FOLLOW UP WITH SLEEP MED FOR CPAP MAY  BE BECAUSE FATIGUE      Candida infection 07/15/2021      Current Outpatient Medications   Medication Sig    acetaminophen (TYLENOL) 500 mg Oral Tablet Take 1 Tablet (500 mg total) by mouth Every 4 hours as needed for Pain    amLODIPine (NORVASC) 5 mg Oral Tablet Take 1 Tablet (5 mg total) by mouth Twice daily    aspirin (ECOTRIN) 81 mg Oral Tablet, Delayed Release (E.C.) Take 1 Tablet  (81 mg total) by mouth    atenoloL (TENORMIN) 50 mg Oral Tablet Take 1 Tablet (50 mg total) by mouth Twice daily    atropine (ISOPTO ATROPINE) 1 % Ophthalmic Drops 1 Drop Four times a day    carvediloL (COREG) 25 mg Oral Tablet Take 1 Tablet (25 mg total) by mouth Twice daily with food    cholecalciferol, vitamin D3, 1,250 mcg (50,000 unit) Oral Capsule Take 1 Capsule (50,000 Units total) by mouth Every 7 days    cyanocobalamin (VITAMIN B 12) 1,000 mcg Oral Tablet Take 2 Tablets (2,000 mcg total) by mouth    docusate sodium (COLACE) 100 mg Oral Capsule Take 1 Capsule (100 mg total) by mouth    dorzolamide-timoloL (COSOPT) 22.3-6.8 mg/mL Ophthalmic Drops Administer 1 Drop into the left eye Twice daily    ergocalciferol, vitamin D2, (DRISDOL) 1,250 mcg (50,000 unit) Oral Capsule Take 1 Capsule (50,000 Units total) by mouth Every 7 days    famotidine (PEPCID) 40 mg Oral Tablet Take 1 Tablet (40 mg total) by mouth Twice daily    fenofibrate (LOFIBRA) 160 mg Oral Tablet Take 1 Tablet (160 mg total) by mouth Once a day  furosemide (LASIX) 20 mg Oral Tablet TAKE ONE TABLET BY MOUTH EVERY DAY    gabapentin (NEURONTIN) 800 mg Oral Tablet TAKE ONE TABLET BY MOUTH THREE TIMES A DAY    hydroCHLOROthiazide (HYDRODIURIL) 25 mg Oral Tablet Take 1 Tablet (25 mg total) by mouth Once a day    insulin glargine (BASAGLAR KWIKPEN U-100 INSULIN) 100 unit/mL Subcutaneous Insulin Pen INJECT 95 UNITS UNDER THE SKIN AT BEDTIME    iron ps complex-B12-folic acid (POLY-IRON 150 FORTE) 150-25-1 mg-mcg-mg Oral Capsule Take 2 Capsules by mouth Once a day    lipase-protease-amylase 24,000-76,000 -120,000 unit Oral Capsule, Delayed Release(E.C.) Take 2 Capsules by mouth Twice daily    magnesium oxide (MAG-OX) 400 mg Oral Tablet Take 1 Tablet (400 mg total) by mouth Twice daily    multivitamin with iron Oral Tablet Take 1 Tablet by mouth Once a day    mycophenolate sodium (MYFORTIC) 180 mg Oral Tablet, Delayed Release (E.C.) Take 3 Tablets (540 mg  total) by mouth Twice daily    NOVOLOG FLEXPEN U-100 INSULIN 100 unit/mL (3 mL) Subcutaneous Insulin Pen INJECT 80 UNITS ONCE A DAY FOR 90 DAYS (Patient taking differently: Inject 100 Units under the skin Once a day)    nystatin (MYCOSTATIN) 100,000 unit/gram Cream Apply topically Twice daily    pantoprazole (PROTONIX) 40 mg Oral Tablet, Delayed Release (E.C.) Take 1 Tablet (40 mg total) by mouth Twice daily    Pen Needle, Disposable, (TRUEPLUS PEN NEEDLE) 31 gauge x 3/16" Needle USE 5 TIMES A DAY    potassium citrate (UROCIT-K) 10 mEq (1,080 mg) Oral Tablet Sustained Release Take 1 Tablet (10 mEq total) by mouth Twice daily with food    pravastatin (PRAVACHOL) 20 mg Oral Tablet Take 1 Tablet (20 mg total) by mouth Every evening    prednisoLONE acetate (PRED FORTE) 1 % Ophthalmic Drops, Suspension Instill 1 Drop into both eyes Twice daily    SSD 1 % Cream APPLY AS DIRECTED ONCE A DAY TO HEEL AND ANKLE    tacrolimus (PROGRAF) 1 mg Oral Capsule TAKE THREE CAPSULES BY MOUTH TWICE A DAY FOR KIDNEY TRANSPLANT    testosterone (ANDROGEL) 20.25 mg/1.25 gram (1.62 %) Transdermal Gel in Metered-dose Pump Place 20.25 mg on the skin Once a day    trimethoprim-sulfamethoxazole (BACTRIM) 80-400mg  per tablet TAKE 1 TABLET 3 TIMES A WEEK    vitamin E 400 unit Oral Capsule Take 1 Capsule (400 Units total) by mouth Once a day        Chronic Disease Management-AMB  Fu visit    This pt  is for  fu today  of chronic problems     Seen by Derm one    and lesion on lip removed path  pending       Fu  aic   6%   low BS here today   52  50 and addressed   Insulin discussed with pt that he needs to drop   10 units  am and pm  and monitor  closely    He is too tightly  controlled    Urine  micro needed       fu ckd  4 sees dr Clyde Canterbury  fu  1 year fu    hx of  transplant  dec   2005     and  [place]    Appts  utd           Hx of abdominal pain   9/18 EGD Dr. Pecola Leisure  [facility], antral ulcer 2016 nl HIDA 2017 nl abd ultrasound x for  FLD    Anemia   fu  labs       atrial fibrillation  pt reports that took coumadin after transplant and recalls 2 yrs of heparin before hand when on dialysis. Pt thinks took coumadin for 9-12 mo after transplant but pt does not recal  Nothing  done  since        cyst of kidney  1/20 pt contacted transplat team, they are not concerned about the 8 cm cyst at the time, pt scheduled for fu for Feb 5th 1/20 8 cm seen on transplanted kidney, sending to transplant team, need to verify that this is not worrisome    diabetes mellitus type 2  Pt has been in  [place] control but  Too much control  Pt's Aic 5.9%  Pt and I discussed how detrimental low BS can be   Insulin needs to be adjusted   Pt brings up medication oral agents he has been on in the past and he would like to be off insulin and oral agents only   However as he has had a renal transplant and can not take medications he was on in the past       diabetic peripheral neuropathy    essential hypertension  bp stable here and at home    gastroesophageal reflux disease currently  stable     glaucoma  2020 pt has fu at [facility] severe Right, followed at [facility], last visit 5/17 sees every 11 mo.    Hx of his Herpes simplex    history of renal transplant left   dec   2005  [facility]    1/20 pt has fu Feb 5th 12/19 Cr 1.69 GFR 45 6/19 Cr 1.41 GFR 56 3/19 Cr 1.67 GFR 46 11/18 seen by Dr. Clyde Canterbury, renal fx worsening, no comment on cause 11/18 Cr 1.65 GFR 47    history of transplantation of pancreas  12/19 [place] nl 8.4 amylase and lipse nl PK txp 2005, pt reports had renal failure and was on HD, qualified for ki tp. 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    hyperlipidemia  Stable on fenofibrate and statin     legal blindness    obesity  12/18 pt would like referral for bariatric surgery, would like to be seen by [facility] because of his hx of transplant    obstructive sleep apnea syndrome  4/18 compliant with CPAP, using nasal pillow, MILD OSA  12/17 mild, will f/u with sleep med for CPAP, may be cause of fatigue (rather than low T)    referred to dr Truddie Crumble   and following  cpap    peptic ulcer  9/18 seen by gen surg/[facility], EGD and sono pending 11/17 pathology report from EGD and bx shows gastric ulcer, no neoplasm, H pylori negative 3/18 will need to decide on f/u EGD with Dr. Pecola Leisure hx of Hiatal hernia    polyp of colon  had cscope 5/17, bx showed melanosis coli only, no colitis    prostate specific antigen abnormal  3/19 PSA 1.5 PSA rise 2016 to 2017 on testosterone, followed by urology/[place], last seen 3/18    testicular hypofunction  5/18 followed by urology, they will follow PSA and testosterone levels prescribed gel 5/18 PSA 1.4 (slightly lower than last) followed by urology 5/18 PSA velocity increase  Pt needs  refill on medication  lab  needed     Vitamin D deficiency  On supplement              REVIEW OF SYSTEMS:   Review of Systems  General: No fever.  No chills.  No weight changes.  HEENT: No vision changes.  Cardiac: No chest pain. No palpitations.  No dizziness.  No light-headedness.  No near syncope.  Resp: No dyspnea at rest, no dyspnea on exertion; no cough or hemoptysis; no orthopnea or PND.  GI: No N/V. No melena.  No bright red blood per rectum.  Ext: No edema.  No claudication.  Neuro: No focal weakness.  No numbness.  All other ROS negative.      Objective:   BP 130/70 (Site: Left, Patient Position: Sitting)   Pulse 84   Temp 36.4 C (97.6 F) (Tympanic)   Ht 1.854 m (6\' 1" )   Wt (!) 152 kg (335 lb 9.6 oz)   SpO2 97%   BMI 44.28 kg/m              PHYSICAL EXAM  Physical Exam  Gen: NAD. Alert.   Upper lip  lesion  s/p bx       HEENT: PERRL; conjunctivae clear. No JVD or carotid bruit.  Cardiac: RRR with normal S1, S2.   Lungs: Clear to auscultation bilaterally. No rales. No wheezing. No rhonchi.  Abdomen: Soft, non-tender.non-distended  nl bowel sounds    Extremities: No edema. No cyanosis. No clubbing.  Neurologic:  Grossly  intact    Lab Results   Component Value Date    CHOLESTEROL 176 05/09/2022    HDLCHOL 53 05/09/2022    LDLCHOL 98 05/09/2022    TRIG 127 05/09/2022       COMPLETE BLOOD COUNT   Lab Results   Component Value Date    WBC 6.4 05/09/2022    HGB 10.9 (L) 05/09/2022    HCT 32.3 (L) 05/09/2022    PLTCNT 245 05/09/2022    BANDS 3 (L) 09/09/2021       DIFFERENTIAL  Lab Results   Component Value Date    PMNS 65 05/09/2022    LYMPHOCYTES 25 05/09/2022    MYELOCYTES 3 09/08/2021    MONOCYTES 8 05/09/2022    EOSINOPHIL 3 05/09/2022    BASOPHILS 0 05/09/2022    BASOPHILS 0.00 05/09/2022    PMNABS 4.20 05/09/2022    LYMPHSABS 1.60 05/09/2022    EOSABS 0.20 05/09/2022    MONOSABS 0.50 05/09/2022        COMPREHENSIVE METABOLIC PANEL   Lab Results   Component Value Date    SODIUM 141 05/09/2022    POTASSIUM 4.4 05/09/2022    CHLORIDE 107 05/09/2022    CO2 26 05/09/2022    ANIONGAP 8 05/09/2022    BUN 26 (H) 05/09/2022    CREATININE 1.96 (H) 05/09/2022    GLUCOSENF 103 05/09/2022    CALCIUM 9.5 05/09/2022    ALBUMIN 4.0 05/09/2022    TOTALPROTEIN 6.7 05/09/2022    ALKPHOS 39 05/09/2022    AST 17 05/09/2022    ALT 17 05/09/2022            THYROID STIMULATING HORMONE  Lab Results   Component Value Date    TSH 3.716 05/09/2022        Lab Results   Component Value Date    HA1C 5.6 05/09/2022     Lab Results   Component Value Date    VITD 46 05/09/2022  Assessment/Plan  Problem List Items Addressed This Visit          Cardiovascular System    Atrial fibrillation (CMS HCC)    Relevant Orders    HEPATITIS C ANTIBODY SCREEN WITH REFLEX TO HCV PCR    MICROALBUMIN/CREATININE RATIO, URINE, RANDOM       Nephrology    Renal transplant recipient    Relevant Orders    HEPATITIS C ANTIBODY SCREEN WITH REFLEX TO HCV PCR    MICROALBUMIN/CREATININE RATIO, URINE, RANDOM       Endocrine    Type 2 diabetes mellitus without complications (CMS HCC)    Relevant Orders    HEPATITIS C ANTIBODY SCREEN WITH REFLEX TO HCV PCR    MICROALBUMIN/CREATININE  RATIO, URINE, RANDOM     Other Visit Diagnoses       Screening for osteoporosis    -  Primary    Relevant Orders    DEXA BONE DENSITOMETRY    HEPATITIS C ANTIBODY SCREEN WITH REFLEX TO HCV PCR    MICROALBUMIN/CREATININE RATIO, URINE, RANDOM             Orders Placed This Encounter    DEXA BONE DENSITOMETRY    HEPATITIS C ANTIBODY SCREEN WITH REFLEX TO HCV PCR    MICROALBUMIN/CREATININE RATIO, URINE, RANDOM    insulin glargine (BASAGLAR KWIKPEN U-100 INSULIN) 100 unit/mL Subcutaneous Insulin Pen    potassium citrate (UROCIT-K) 10 mEq (1,080 mg) Oral Tablet Sustained Release    hydroCHLOROthiazide (HYDRODIURIL) 25 mg Oral Tablet    iron ps complex-B12-folic acid (POLY-IRON 150 FORTE) 150-25-1 mg-mcg-mg Oral Capsule        Meds reviewed as well as labs.  Chart reviewed and updated.   Continue current treatment.  Keep follow-up appointment.   Vaccine hx reviewed.   Increase mg   3 per day  and  mg level today     Keep fu at [facility]      To see  Dr  Clyde Canterbury  this summer   Eye exam  March      Drip insulin  from  35 units  and 45 units    to   25units am    35units pm as  BS  60 at home and  aic  6%  Hept c  testing  screening    Will ge Dr  Pecola Leisure  last colonoscopy     Urine  micro ordered    52  BS  here  15 of glucose given  and  he ate  crackers   monitor did not  go off    BS  repeat   50     repeat   q     48    than   52  ( peripheral)    53   and   72 now at   1135 am   [place]  and  gram crackers peanut butter given      Increase creon  36000  due to dumping with eating

## 2022-08-29 ENCOUNTER — Other Ambulatory Visit (INDEPENDENT_AMBULATORY_CARE_PROVIDER_SITE_OTHER): Payer: Self-pay | Admitting: Internal Medicine

## 2022-08-29 MED ORDER — FENOFIBRATE 160 MG TABLET
160.0000 mg | ORAL_TABLET | Freq: Every day | ORAL | 4 refills | Status: DC
Start: 2022-08-29 — End: 2023-05-22

## 2022-08-30 LAB — PSA DIAGNOSTIC WITH FREE PSA REFLEX: PSA: 1.46 ng/mL (ref <=4.00)

## 2022-08-30 LAB — HEPATITIS C ANTIBODY SCREEN WITH REFLEX TO HCV PCR: HCV ANTIBODY QUALITATIVE: NEGATIVE

## 2022-09-01 LAB — TESTOSTERONE FREE (DIALYSIS) AND TOTAL,MS
TESTOSTERONE, FREE: 46 pg/mL (ref 35.0–155.0)
TESTOSTERONE,TOTAL,LC/MS/MS: 338 ng/dL (ref 250–1100)

## 2022-09-03 ENCOUNTER — Telehealth (INDEPENDENT_AMBULATORY_CARE_PROVIDER_SITE_OTHER): Payer: Self-pay | Admitting: Internal Medicine

## 2022-09-04 ENCOUNTER — Encounter (INDEPENDENT_AMBULATORY_CARE_PROVIDER_SITE_OTHER): Payer: Self-pay | Admitting: Internal Medicine

## 2022-09-08 ENCOUNTER — Encounter (INDEPENDENT_AMBULATORY_CARE_PROVIDER_SITE_OTHER): Payer: Self-pay | Admitting: Internal Medicine

## 2022-09-10 ENCOUNTER — Other Ambulatory Visit (INDEPENDENT_AMBULATORY_CARE_PROVIDER_SITE_OTHER): Payer: Self-pay | Admitting: Internal Medicine

## 2022-09-11 ENCOUNTER — Other Ambulatory Visit (INDEPENDENT_AMBULATORY_CARE_PROVIDER_SITE_OTHER): Payer: Self-pay | Admitting: Internal Medicine

## 2022-09-11 MED ORDER — MAGNESIUM OXIDE 400 MG (241.3 MG MAGNESIUM) TABLET
400.0000 mg | ORAL_TABLET | Freq: Three times a day (TID) | ORAL | 3 refills | Status: DC
Start: 2022-09-11 — End: 2023-01-13

## 2022-09-22 ENCOUNTER — Telehealth (INDEPENDENT_AMBULATORY_CARE_PROVIDER_SITE_OTHER): Payer: Self-pay | Admitting: Internal Medicine

## 2022-09-22 MED ORDER — NYSTATIN 100,000 UNIT/GRAM TOPICAL CREAM: TOPICAL_CREAM | Freq: Two times a day (BID) | CUTANEOUS | 2 refills | Status: DC

## 2022-09-24 ENCOUNTER — Institutional Professional Consult (permissible substitution) (HOSPITAL_BASED_OUTPATIENT_CLINIC_OR_DEPARTMENT_OTHER): Payer: Self-pay

## 2022-10-01 ENCOUNTER — Encounter (HOSPITAL_BASED_OUTPATIENT_CLINIC_OR_DEPARTMENT_OTHER): Payer: Self-pay

## 2022-10-01 ENCOUNTER — Inpatient Hospital Stay (HOSPITAL_BASED_OUTPATIENT_CLINIC_OR_DEPARTMENT_OTHER)
Admission: RE | Admit: 2022-10-01 | Discharge: 2022-10-01 | Disposition: A | Discharge status: Still In Hospital | Payer: Medicare Other | Source: Ambulatory Visit | Attending: RADIATION ONCOLOGY | Admitting: RADIATION ONCOLOGY

## 2022-10-01 ENCOUNTER — Other Ambulatory Visit: Payer: Self-pay

## 2022-10-01 DIAGNOSIS — Z79899 Other long term (current) drug therapy: Secondary | ICD-10-CM | POA: Insufficient documentation

## 2022-10-01 DIAGNOSIS — E782 Mixed hyperlipidemia: Secondary | ICD-10-CM | POA: Insufficient documentation

## 2022-10-01 DIAGNOSIS — E1142 Type 2 diabetes mellitus with diabetic polyneuropathy: Secondary | ICD-10-CM | POA: Insufficient documentation

## 2022-10-01 DIAGNOSIS — K219 Gastro-esophageal reflux disease without esophagitis: Secondary | ICD-10-CM | POA: Insufficient documentation

## 2022-10-01 DIAGNOSIS — I4891 Unspecified atrial fibrillation: Secondary | ICD-10-CM | POA: Insufficient documentation

## 2022-10-01 DIAGNOSIS — C44519 Basal cell carcinoma of skin of other part of trunk: Secondary | ICD-10-CM | POA: Insufficient documentation

## 2022-10-01 DIAGNOSIS — C4401 Basal cell carcinoma of skin of lip: Secondary | ICD-10-CM | POA: Insufficient documentation

## 2022-10-01 DIAGNOSIS — Z940 Kidney transplant status: Secondary | ICD-10-CM | POA: Insufficient documentation

## 2022-10-01 DIAGNOSIS — Z510 Encounter for antineoplastic radiation therapy: Secondary | ICD-10-CM | POA: Insufficient documentation

## 2022-10-01 NOTE — Progress Notes (Signed)
Treatment Setup Simple Simulation [phone])     Name: Martin Porter MRN:  [id number]   Date: 10/01/2022 Age: 57 y.o.                Following the successful creation of  Martin Porter's electron treatment plan, [name]  was taken into the treatment suite and placed in the exact treatment position used during the simulation and planning process.  The appropriate immobilization devices were employed in an effort to minimize the patient's movement during the setup and treatment procedure.    In an effort to maximize the accuracy of the patient's daily position, fiducial marks have been placed on the patient.  Laser positioning systems within the room and have been programmed to move to the alignment position which are specific to this patient.      Once the patient was properly positioned, the movement of the table range specific for the patient's treatment was tested to ensure the planned range was achievable.  The clearance of the patient's extremities as well as the immobilization devices were evaluated to ensure there would be no collision between the patient or the patient's devices and the gantry housing.      The movement of the lasers to the patient specific fiducial location was evaluated to ensure the proper patient setup parameters were achieved.     Finally, following the position verification and visualization of the electron treatment field position in comparison with the treatment planning images, I approved the start of treatment.  Images showing field shaping and positioning were verified for each treatment gantry position.  Molli Barrows, MD

## 2022-10-01 NOTE — Addendum Note (Signed)
Encounter addended by: Ranee Gosselin, RT (T) on: 10/01/2022 11:07 AM   Actions taken: Charge Capture section accepted

## 2022-10-01 NOTE — Progress Notes (Signed)
RADIATION ONCOLOGY CONSULTATION    Patient Name: Martin Porter  Med Record #: [id number]  Date of Birth:  Jun 24, 1965    Diagnosis/Stage:  Basal cell carcinoma of the left upper lip      Chief Complaint:  Skin cancer      Prior Radiation:  None     Prior Chemotherapy:  None    Pain Assessment:  None      Subjective:   History of Present Illness :   Martin Porter is a 57 y.o. male with a history of multiple organ transplant and has been on Prograf for nearly 20 years.  He has developed a small basal cell carcinoma of the left upper lip.  This has been biopsied by Dr. Ria Bush.  He has declined surgery.  He has no other skin cancers noted but has small bothersome area on the right hip.      Implantable Cardiac Device:  None  Pregnancy Status: not applicable      Past Medical/Surgical History:  Past Medical History:   Diagnosis Date    Abnormal prostate specific antigen     Anemia, unspecified     Atrial fibrillation (CMS HCC)     Candida infection     CKD (chronic kidney disease) stage 3, GFR 30-59 ml/min (CMS HCC)     Cyst of kidney, acquired     Diabetic peripheral neuropathy (CMS HCC)     GERD (gastroesophageal reflux disease)     Herpes simplex     Hiatal hernia     Hypogonadism in male     Hypomagnesemia     Impotence     Legal blindness     Mixed hyperlipidemia     Obesity, unspecified     12/18 pt would like referral for bariatric surgery, would like to be seen by [facility] because of his hx of transplant    Obstructive sleep apnea syndrome     4/18 compliant with CPAP, using nasal PILLOW MILD OSA. 12/17 MILD, WILL FOLLOW UP WITH SLEEP MED FOR CPAP MAY  BE BECAUSE FATIGUE    Peptic ulcer     9/18 seen by gen surg/[facility], EGD and sono pending gastric ulcer, no neoplasm, H pylori negative 3/18 will need to decide on f/u EGD with Dr. Despina Arias    Polyp of colon     Renal transplant recipient     Testicular hypofunction     5/18 followed by urology, they will follow PSA and testosterone levels prescribed gel 5/18 PSA  1.4 (slightly lower than last) followed by urology 5/18 PSA velocity increased form 2016-2017, testosterone held, was to have repeat PSA/VitD and T levels in our clinic and CC to Dr. Ricki Miller Right testicular hypotrophy    Tobacco dependence syndrome     Type 2 diabetes mellitus without complications (CMS HCC)     Unspecified glaucoma(365.9)     Vitamin D deficiency          Past Surgical History:   Procedure Laterality Date    COLONOSCOPY      DEBRIDEMENT  FOOT Right     EYE SURGERY      FOOT SURGERY      HX HERNIA REPAIR      HX RENAL TRANSPLANT      1/20 pt has fu Feb 5th 12/19 Cr 1.69 GFR 45 6/19 Cr 1.41 GFR 56 3/19 Cr 1.67 GFR 46 11/18 seen by Dr. Clyde Canterbury, renal fx worsening, no comment on cause 11/18 Cr 1.65 GFR 478/18  Cr 1.95 GFR 38 6/18 Cr 1.5, Ca mildly high, SPE P/UPEPpending and asap f/u with nephrology 5/18 back to baseline cr 1.2 and GFR 68 CKD GFR 59 5/18 and was 59 11/17. Last Creat 1.36 increased from 1.2 11/17.    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 It seems that pt underwent  pancreas tp to "cure" DM/islet cells  a few years ago pt seen by endo and given OHG had pancreatitis, thought due to the DM medicines    SHOULDER SURGERY             Medication History:  Current Outpatient Medications   Medication Sig    acetaminophen (TYLENOL) 500 mg Oral Tablet Take 1 Tablet (500 mg total) by mouth Every 4 hours as needed for Pain    amLODIPine (NORVASC) 5 mg Oral Tablet Take 1 Tablet (5 mg total) by mouth Twice daily    aspirin (ECOTRIN) 81 mg Oral Tablet, Delayed Release (E.C.) Take 1 Tablet (81 mg total) by mouth    atenoloL (TENORMIN) 50 mg Oral Tablet Take 1 Tablet (50 mg total) by mouth Twice daily    atropine (ISOPTO ATROPINE) 1 % Ophthalmic Drops 1 Drop Four times a day    carvediloL (COREG) 25 mg Oral  Tablet Take 1 Tablet (25 mg total) by mouth Twice daily with food    cholecalciferol, vitamin D3, 1,250 mcg (50,000 unit) Oral Capsule Take 1 Capsule (50,000 Units total) by mouth Every 7 days    cyanocobalamin (VITAMIN B 12) 1,000 mcg Oral Tablet Take 2 Tablets (2,000 mcg total) by mouth    docusate sodium (COLACE) 100 mg Oral Capsule Take 1 Capsule (100 mg total) by mouth    dorzolamide-timoloL (COSOPT) 22.3-6.8 mg/mL Ophthalmic Drops Administer 1 Drop into the left eye Twice daily    ergocalciferol, vitamin D2, (DRISDOL) 1,250 mcg (50,000 unit) Oral Capsule Take 1 Capsule (50,000 Units total) by mouth Every 7 days    famotidine (PEPCID) 40 mg Oral Tablet Take 1 Tablet (40 mg total) by mouth Twice daily    fenofibrate (LOFIBRA) 160 mg Oral Tablet Take 1 Tablet (160 mg total) by mouth Once a day    furosemide (LASIX) 20 mg Oral Tablet TAKE ONE TABLET BY MOUTH EVERY DAY    gabapentin (NEURONTIN) 800 mg Oral Tablet TAKE ONE TABLET BY MOUTH THREE TIMES A DAY    hydroCHLOROthiazide (HYDRODIURIL) 25 mg Oral Tablet Take 1 Tablet (25 mg total) by mouth Once a day    insulin glargine (BASAGLAR KWIKPEN U-100 INSULIN) 100 unit/mL Subcutaneous Insulin Pen INJECT 95 UNITS UNDER THE SKIN AT BEDTIME    iron ps complex-B12-folic acid (POLY-IRON 150 FORTE) 150-25-1 mg-mcg-mg Oral Capsule Take 2 Capsules by mouth Once a day    magnesium oxide (MAG-OX) 400 mg Oral Tablet Take 1 Tablet (400 mg total) by mouth Three times a day    multivitamin with iron Oral Tablet Take 1 Tablet by mouth Once a day    mycophenolate sodium (MYFORTIC) 180 mg Oral Tablet, Delayed Release (E.C.) Take 3 Tablets (540 mg total) by mouth Twice daily    NOVOLOG FLEXPEN U-100 INSULIN 100 unit/mL (3 mL) Subcutaneous Insulin Pen INJECT 80 UNITS ONCE A DAY FOR 90 DAYS (Patient taking  differently: Inject 100 Units under the skin Once a day)    nystatin (MYCOSTATIN) 100,000 unit/gram Cream Apply topically Twice daily    pantoprazole (PROTONIX) 40 mg Oral Tablet,  Delayed Release (E.C.) Take 1 Tablet (40 mg total) by mouth Twice daily    Pen Needle, Disposable, (BD UF MINI PEN NEEDLE) 31 gauge x 3/16" Needle USE 5 TIMES A DAY    potassium citrate (UROCIT-K) 10 mEq (1,080 mg) Oral Tablet Sustained Release Take 1 Tablet (10 mEq total) by mouth Twice daily with food    pravastatin (PRAVACHOL) 20 mg Oral Tablet Take 1 Tablet (20 mg total) by mouth Every evening    prednisoLONE acetate (PRED FORTE) 1 % Ophthalmic Drops, Suspension Instill 1 Drop into both eyes Twice daily    SSD 1 % Cream APPLY AS DIRECTED ONCE A DAY TO HEEL AND ANKLE    tacrolimus (PROGRAF) 1 mg Oral Capsule TAKE THREE CAPSULES BY MOUTH TWICE A DAY FOR KIDNEY TRANSPLANT    testosterone (ANDROGEL) 20.25 mg/1.25 gram (1.62 %) Transdermal Gel in Metered-dose Pump Place 20.25 mg on the skin Once a day    trimethoprim-sulfamethoxazole (BACTRIM) 80-400mg  per tablet TAKE 1 TABLET 3 TIMES A WEEK    vitamin E 400 unit Oral Capsule Take 1 Capsule (400 Units total) by mouth Once a day     No Known Allergies    Family History:   Family Medical History:       Problem Relation (Age of Onset)    COPD Father    Diabetes type II Mother, Father    Other Maternal Grandfather              Social History:   Social History     Socioeconomic History    Marital status: Single     Spouse name: Not on file    Number of children: Not on file    Years of education: Not on file    Highest education level: Not on file   Occupational History    Not on file   Tobacco Use    Smoking status: Never    Smokeless tobacco: Current     Types: Snuff   Vaping Use    Vaping status: Never Used   Substance and Sexual Activity    Alcohol use: Never    Drug use: Never    Sexual activity: Not Currently   Other Topics Concern    Not on file   Social History Narrative    Not on file     Social Determinants of Health     Financial Resource Strain: Low Risk  (11/04/2021)    Financial Resource Strain     SDOH Financial: No   Transportation Needs: High Risk  (11/04/2021)    Transportation Needs     SDOH Transportation: Yes, it has kept me from medical appointments or from getting my medications   Social Connections: Medium Risk (11/04/2021)    Social Connections     SDOH Social Isolation: 3 to 5 times a week   Intimate Partner Violence: Low Risk  (11/04/2021)    Intimate Partner Violence     SDOH Domestic Violence: No   Housing Stability: Low Risk  (11/04/2021)    Housing Stability     SDOH Housing Situation: I have housing.     SDOH Housing Worry: No       Review of Systems:  Constitutional:  negative for fevers, chills, anorexia, fatigue and weight loss  Eyes:  negative for  visual disturbance and irritation  Ears, nose, mouth, throat:  negative for hearing loss, nasal congestion, sore throat, hoarseness and voice change  Cardiac/vascular:  negative for chest pain, palpitations and lower extremity edema  Respiratory:  negative for hemoptysis, pleurisy/chest pain, cough and dyspnea  Gastrointestinal:  negative for dysphagia, nausea, vomiting, melena, diarrhea and abdominal pain  Genitourinary:  negative for dysuria, urinary incontinence and hematuria  Musculoskeletal:  negative for myalgias, muscle weakness, neck pain, back pain and hip pain   Neurological:  negative for seizures, speech problems, gait problems, dizziness and headaches  Behavioral/Psych:  negative for behavior problems  Hematologic/lymphatic:  negative for bleeding and lymphadenopathy  Integumentary (skin, breast):  negative for rash, skin lesion(s), pruritus and dryness  Endocrine:  negative  Allergic/Immunologic:  negative      Objective:     Vitals:    10/01/22 0956   BP: (!) 171/75   Pulse: 71   Resp: 18   Temp: 36.4 C (97.6 F)   Weight: (!) 141 kg (310 lb)   Height: 1.854 m (6\' 1" )           CONSTITUTIONAL: No acute distress, KPS 90% - capable of normal activity, few symptoms or signs of disease, ECOG (1) Restricted in physically strenuous activity, ambulatory and able to do work of light nature  EYES:  conjunctiva clear, pupils equal round and reactive to light  ENT: Normocephalic, atraumatic, moist oral cavity mucous membranes.  1 cm basal cell carcinoma of the left lip.  NECK: No masses or palpable lymphadenopathy  RESPIRATORY: Clear to auscultation bilaterally  CARDIOVASCULAR:Regular rate and rhythm, no murmurs appreciated.  GASTROINTESTINAL: Soft, non-tender, non-distended, no palpable hepatosplenomegaly or masses.    GENITOURINARY:  Deferred  MUSCULOSKELETAL:  No tenderness to palpation in joints or spine.  Right hip with mild palpable firmness in a focal area consistent with lipoma.  NEUROLOGIC: AOx3, cranial nerves II-XII intact, grossly normal motor and sensory exams  LYMPHATICS: No palpable lymphadenopathy  PSYCHIATRIC: Pleasant, normal affect    LABS/IMAGING: All relevant labs and imaging were reviewed as per HPI.    Assessment/Recommendations:   Mr. Gilleland is a pleasant 57 year old with basal cell carcinoma of the left lip.  I have offered him definitive radiotherapy.  We have discussed the potential side effects at length.  He has had ample time to have his questions answered and wishes to pursue this treatment.  He will undergo planning today.      The indications, time course, benefits, risks and side effects of radiation treatment were explained to the patient, and his questions were answered to his apparent satisfaction. I encouraged him to contact us at any time should he have any further questions or concerns. I personally saw and examined the patient, and reviewed all prior imaging and pathologic findings with him. I spent greater than 50% of a 45 minute visit in discussion of the patient's diagnosis and management.    Molli Barrows, MD 10/01/2022, 10:26

## 2022-10-02 ENCOUNTER — Inpatient Hospital Stay (HOSPITAL_BASED_OUTPATIENT_CLINIC_OR_DEPARTMENT_OTHER)
Admission: RE | Admit: 2022-10-02 | Discharge: 2022-10-02 | Disposition: A | Discharge status: Still In Hospital | Payer: Medicare Other | Source: Ambulatory Visit

## 2022-10-03 ENCOUNTER — Other Ambulatory Visit: Payer: Self-pay

## 2022-10-03 ENCOUNTER — Inpatient Hospital Stay (HOSPITAL_BASED_OUTPATIENT_CLINIC_OR_DEPARTMENT_OTHER)
Admission: RE | Admit: 2022-10-03 | Discharge: 2022-10-03 | Disposition: A | Discharge status: Still In Hospital | Payer: Medicare Other | Source: Ambulatory Visit

## 2022-10-06 ENCOUNTER — Inpatient Hospital Stay (HOSPITAL_BASED_OUTPATIENT_CLINIC_OR_DEPARTMENT_OTHER)
Admission: RE | Admit: 2022-10-06 | Discharge: 2022-10-06 | Disposition: A | Discharge status: Still In Hospital | Payer: Medicare Other | Source: Ambulatory Visit

## 2022-10-06 ENCOUNTER — Other Ambulatory Visit (INDEPENDENT_AMBULATORY_CARE_PROVIDER_SITE_OTHER): Payer: Self-pay | Admitting: Internal Medicine

## 2022-10-06 ENCOUNTER — Other Ambulatory Visit: Payer: Self-pay

## 2022-10-07 ENCOUNTER — Inpatient Hospital Stay (HOSPITAL_BASED_OUTPATIENT_CLINIC_OR_DEPARTMENT_OTHER)
Admission: RE | Admit: 2022-10-07 | Discharge: 2022-10-07 | Disposition: A | Discharge status: Still In Hospital | Payer: Medicare Other | Source: Ambulatory Visit

## 2022-10-08 ENCOUNTER — Inpatient Hospital Stay (HOSPITAL_BASED_OUTPATIENT_CLINIC_OR_DEPARTMENT_OTHER)
Admission: RE | Admit: 2022-10-08 | Discharge: 2022-10-08 | Disposition: A | Discharge status: Still In Hospital | Payer: Medicare Other | Source: Ambulatory Visit

## 2022-10-08 ENCOUNTER — Other Ambulatory Visit: Payer: Self-pay

## 2022-10-08 NOTE — Addendum Note (Signed)
Encounter addended by: Haiven Nardone, RT (T) on: 10/08/2022 12:07 PM   Actions taken: Charge Capture section accepted

## 2022-10-08 NOTE — Progress Notes (Signed)
WEEKLY RADIATION THERAPY PROGRESS NOTE     Name: Martin Porter MRN:  [id number]   Date: 10/08/2022 Age: 57 y.o.        WEIGHT:   Wt Readings from Last 1 Encounters:   10/01/22 (!) 141 kg (310 lb)        FRACTION:  4 of 16    DOSE:  12 of 48 Gy    TUMOR:  Basal cell carcinoma of the lip    SUBJECTIVE:  Mild dry skin.    PHYSICAL EXAM:  No change; no distress.  RADIOGRAPHS:  Clinical setup.    COMMENTS:  Continue XRT.

## 2022-10-09 ENCOUNTER — Inpatient Hospital Stay (HOSPITAL_BASED_OUTPATIENT_CLINIC_OR_DEPARTMENT_OTHER)
Admission: RE | Admit: 2022-10-09 | Discharge: 2022-10-09 | Disposition: A | Discharge status: Still In Hospital | Payer: Medicare Other | Source: Ambulatory Visit

## 2022-10-10 ENCOUNTER — Other Ambulatory Visit: Payer: Self-pay

## 2022-10-10 ENCOUNTER — Inpatient Hospital Stay (HOSPITAL_BASED_OUTPATIENT_CLINIC_OR_DEPARTMENT_OTHER)
Admission: RE | Admit: 2022-10-10 | Discharge: 2022-10-10 | Disposition: A | Discharge status: Still In Hospital | Payer: Medicare Other | Source: Ambulatory Visit

## 2022-10-13 ENCOUNTER — Other Ambulatory Visit: Payer: Self-pay

## 2022-10-13 ENCOUNTER — Inpatient Hospital Stay (HOSPITAL_BASED_OUTPATIENT_CLINIC_OR_DEPARTMENT_OTHER)
Admission: RE | Admit: 2022-10-13 | Discharge: 2022-10-13 | Disposition: A | Discharge status: Still In Hospital | Payer: Medicare Other | Source: Ambulatory Visit

## 2022-10-14 ENCOUNTER — Inpatient Hospital Stay (HOSPITAL_BASED_OUTPATIENT_CLINIC_OR_DEPARTMENT_OTHER)
Admission: RE | Admit: 2022-10-14 | Discharge: 2022-10-14 | Disposition: A | Discharge status: Still In Hospital | Payer: Medicare Other | Source: Ambulatory Visit

## 2022-10-15 ENCOUNTER — Inpatient Hospital Stay (HOSPITAL_BASED_OUTPATIENT_CLINIC_OR_DEPARTMENT_OTHER)
Admission: RE | Admit: 2022-10-15 | Discharge: 2022-10-15 | Disposition: A | Discharge status: Still In Hospital | Payer: Medicare Other | Source: Ambulatory Visit

## 2022-10-15 ENCOUNTER — Other Ambulatory Visit: Payer: Self-pay

## 2022-10-16 ENCOUNTER — Inpatient Hospital Stay (HOSPITAL_BASED_OUTPATIENT_CLINIC_OR_DEPARTMENT_OTHER)
Admission: RE | Admit: 2022-10-16 | Discharge: 2022-10-16 | Disposition: A | Discharge status: Still In Hospital | Payer: Medicare Other | Source: Ambulatory Visit

## 2022-10-17 ENCOUNTER — Other Ambulatory Visit: Payer: Self-pay

## 2022-10-17 ENCOUNTER — Inpatient Hospital Stay (HOSPITAL_BASED_OUTPATIENT_CLINIC_OR_DEPARTMENT_OTHER)
Admission: RE | Admit: 2022-10-17 | Discharge: 2022-10-17 | Disposition: A | Discharge status: Still In Hospital | Payer: Medicare Other | Source: Ambulatory Visit

## 2022-10-17 ENCOUNTER — Telehealth (INDEPENDENT_AMBULATORY_CARE_PROVIDER_SITE_OTHER): Payer: Self-pay | Admitting: Internal Medicine

## 2022-10-17 NOTE — Progress Notes (Signed)
WEEKLY RADIATION THERAPY PROGRESS NOTE     Name: Martin Porter MRN:  [id number]   Date: 10/17/2022 Age: 57 y.o.        WEIGHT:   Wt Readings from Last 1 Encounters:   10/01/22 (!) 141 kg (310 lb)        FRACTION:  11 of 16    33 of 48 Gy    TUMOR:  Basal cell carcinoma of the lip    SUBJECTIVE:  Mild dry skin.    PHYSICAL EXAM:  No change; no distress.  RADIOGRAPHS:  Clinical setup.    COMMENTS:  Continue XRT.

## 2022-10-17 NOTE — Addendum Note (Signed)
Encounter addended by: Ranee Gosselin, RT (T) on: 10/17/2022 11:32 AM   Actions taken: Charge Capture section accepted

## 2022-10-17 NOTE — Nurses Notes (Signed)
Rx for 2% viscous lidocaine to be dabbed on lesion on upper lip prn, called to [facility] per patients preference.  Also, asked pharmacy to deliver him a tube of Neosporin +pain relief to be used on the same lesion as needed.

## 2022-10-20 ENCOUNTER — Inpatient Hospital Stay (HOSPITAL_BASED_OUTPATIENT_CLINIC_OR_DEPARTMENT_OTHER)
Admission: RE | Admit: 2022-10-20 | Discharge: 2022-10-20 | Disposition: A | Discharge status: Still In Hospital | Payer: Medicare Other | Source: Ambulatory Visit

## 2022-10-20 ENCOUNTER — Encounter (INDEPENDENT_AMBULATORY_CARE_PROVIDER_SITE_OTHER): Payer: Self-pay | Admitting: Internal Medicine

## 2022-10-20 ENCOUNTER — Other Ambulatory Visit: Payer: Self-pay

## 2022-10-20 ENCOUNTER — Ambulatory Visit (HOSPITAL_BASED_OUTPATIENT_CLINIC_OR_DEPARTMENT_OTHER): Payer: Medicare Other | Admitting: Internal Medicine

## 2022-10-20 VITALS — BP 140/82 | HR 82

## 2022-10-20 DIAGNOSIS — Z940 Kidney transplant status: Secondary | ICD-10-CM | POA: Insufficient documentation

## 2022-10-20 DIAGNOSIS — E782 Mixed hyperlipidemia: Secondary | ICD-10-CM

## 2022-10-20 DIAGNOSIS — K219 Gastro-esophageal reflux disease without esophagitis: Secondary | ICD-10-CM

## 2022-10-20 DIAGNOSIS — E119 Type 2 diabetes mellitus without complications: Secondary | ICD-10-CM | POA: Insufficient documentation

## 2022-10-20 DIAGNOSIS — I480 Paroxysmal atrial fibrillation: Secondary | ICD-10-CM

## 2022-10-20 LAB — COMPREHENSIVE METABOLIC PNL, FASTING
ALBUMIN/GLOBULIN RATIO: 1.5 — ABNORMAL HIGH (ref 0.8–1.4)
ALBUMIN: 4 g/dL (ref 3.5–5.7)
ALKALINE PHOSPHATASE: 40 U/L (ref 34–104)
ALT (SGPT): 24 U/L (ref 7–52)
ANION GAP: 5 mmol/L (ref 4–13)
AST (SGOT): 22 U/L (ref 13–39)
BILIRUBIN TOTAL: 0.3 mg/dL (ref 0.3–1.2)
BUN/CREA RATIO: 14 (ref 6–22)
BUN: 42 mg/dL — ABNORMAL HIGH (ref 7–25)
CALCIUM, CORRECTED: 9.3 mg/dL (ref 8.9–10.8)
CALCIUM: 9.3 mg/dL (ref 8.6–10.3)
CHLORIDE: 107 mmol/L (ref 98–107)
CO2 TOTAL: 27 mmol/L (ref 21–31)
CREATININE: 3.04 mg/dL — ABNORMAL HIGH (ref 0.60–1.30)
ESTIMATED GFR: 23 mL/min/{1.73_m2} — ABNORMAL LOW (ref >59)
GLOBULIN: 2.6 — ABNORMAL LOW (ref 2.9–5.4)
GLUCOSE: 126 mg/dL — ABNORMAL HIGH (ref 74–109)
OSMOLALITY, CALCULATED: 290 mOsm/kg (ref 270–290)
POTASSIUM: 5.4 mmol/L — ABNORMAL HIGH (ref 3.5–5.1)
PROTEIN TOTAL: 6.6 g/dL (ref 6.4–8.9)
SODIUM: 139 mmol/L (ref 136–145)

## 2022-10-20 LAB — CBC WITH DIFF
BASOPHIL #: 0 10*3/uL (ref 0.00–0.10)
BASOPHIL %: 1 % (ref 0–1)
EOSINOPHIL #: 0.2 10*3/uL (ref 0.00–0.50)
EOSINOPHIL %: 4 %
HCT: 31.8 % — ABNORMAL LOW (ref 36.7–47.1)
HGB: 10.5 g/dL — ABNORMAL LOW (ref 12.5–16.3)
LYMPHOCYTE #: 1.5 10*3/uL (ref 1.00–3.00)
LYMPHOCYTE %: 27 % (ref 16–44)
MCH: 31 pg (ref 23.8–33.4)
MCHC: 32.9 g/dL (ref 32.5–36.3)
MCV: 94.1 fL (ref 73.0–96.2)
MONOCYTE #: 0.4 10*3/uL (ref 0.30–1.00)
MONOCYTE %: 8 % (ref 5–13)
MPV: 10.9 fL (ref 7.4–11.4)
NEUTROPHIL #: 3.2 10*3/uL (ref 1.85–7.80)
NEUTROPHIL %: 60 % (ref 43–77)
PLATELETS: 200 10*3/uL (ref 140–440)
RBC: 3.38 10*6/uL — ABNORMAL LOW (ref 4.06–5.63)
RDW: 13.3 % (ref 12.1–16.2)
WBC: 5.3 10*3/uL (ref 3.6–10.2)

## 2022-10-20 LAB — LIPID PANEL
CHOL/HDL RATIO: 4.3
CHOLESTEROL: 180 mg/dL (ref <200)
HDL CHOL: 42 mg/dL (ref >=40)
LDL CALC: 102 mg/dL — ABNORMAL HIGH (ref 0–100)
TRIGLYCERIDES: 181 mg/dL — ABNORMAL HIGH (ref <=150)
VLDL CALC: 36 mg/dL (ref 0–50)

## 2022-10-20 LAB — MAGNESIUM: MAGNESIUM: 2.1 mg/dL (ref 1.9–2.7)

## 2022-10-20 LAB — THYROID STIMULATING HORMONE (SENSITIVE TSH): TSH: 2.03 u[IU]/mL (ref 0.450–5.330)

## 2022-10-20 MED ORDER — CLONIDINE HCL 0.1 MG TABLET
0.1000 mg | ORAL_TABLET | Freq: Two times a day (BID) | ORAL | 1 refills | Status: DC
Start: 2022-10-20 — End: 2022-12-10

## 2022-10-20 NOTE — Progress Notes (Signed)
Name: Martin Porter                       Date of Birth: 01-05-1966   MRN:  [id number]                         Date of visit: 10/20/2022     PCP: Samuella Cota, PA-C     Subjective  Martin Porter is a 57 y.o. year old male who presents for Elevated Blood Pressure (Having elevated blood pressure at chemo.)   to clinic.  No specialty comments available.   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 12/17 MILD, WILL FOLLOW UP WITH SLEEP MED FOR CPAP MAY  BE BECAUSE FATIGUE      Candida infection 07/15/2021      Current Outpatient Medications   Medication Sig    acetaminophen (TYLENOL) 500 mg Oral Tablet Take 1 Tablet (500 mg total) by mouth Every 4 hours as needed for Pain    amLODIPine (NORVASC) 5 mg Oral Tablet Take 1 Tablet (5 mg total) by mouth Twice daily    aspirin (ECOTRIN) 81 mg Oral Tablet,  Delayed Release (E.C.) Take 1 Tablet (81 mg total) by mouth    atenoloL (TENORMIN) 50 mg Oral Tablet Take 1 Tablet (50 mg total) by mouth Twice daily    atropine (ISOPTO ATROPINE) 1 % Ophthalmic Drops 1 Drop Four times a day    carvediloL (COREG) 25 mg Oral Tablet Take 1 Tablet (25 mg total) by mouth Twice daily with food    cholecalciferol, vitamin D3, 1,250 mcg (50,000 unit) Oral Capsule Take 1 Capsule (50,000 Units total) by mouth Every 7 days    cyanocobalamin (VITAMIN B 12) 1,000 mcg Oral Tablet Take 2 Tablets (2,000 mcg total) by mouth    docusate sodium (COLACE) 100 mg Oral Capsule Take 1 Capsule (100 mg total) by mouth    dorzolamide-timoloL (COSOPT) 22.3-6.8 mg/mL Ophthalmic Drops Administer 1 Drop into the left eye Twice daily    ergocalciferol, vitamin D2, (DRISDOL) 1,250 mcg (50,000 unit) Oral Capsule Take 1 Capsule (50,000 Units total) by mouth Every 7 days    famotidine (PEPCID) 40 mg Oral Tablet Take 1 Tablet (40 mg total) by mouth Twice daily    fenofibrate (LOFIBRA) 160 mg Oral Tablet Take 1 Tablet (160 mg total) by mouth Once a  day    furosemide (LASIX) 20 mg Oral Tablet TAKE ONE TABLET BY MOUTH EVERY DAY    gabapentin (NEURONTIN) 800 mg Oral Tablet TAKE ONE TABLET BY MOUTH THREE TIMES A DAY    hydroCHLOROthiazide (HYDRODIURIL) 25 mg Oral Tablet Take 1 Tablet (25 mg total) by mouth Once a day    insulin glargine (BASAGLAR KWIKPEN U-100 INSULIN) 100 unit/mL Subcutaneous Insulin Pen INJECT 95 UNITS UNDER THE SKIN AT BEDTIME    iron ps complex-B12-folic acid (POLY-IRON 150 FORTE) 150-25-1 mg-mcg-mg Oral Capsule Take 2 Capsules by mouth Once a day    magnesium oxide (MAG-OX) 400 mg Oral Tablet Take 1 Tablet (400 mg total) by mouth Three times a day    multivitamin with iron Oral Tablet Take 1 Tablet by mouth Once a day    mycophenolate sodium (MYFORTIC) 180 mg Oral Tablet, Delayed Release (E.C.) Take 3 Tablets (540 mg total) by mouth Twice daily    NOVOLOG FLEXPEN U-100 INSULIN 100 unit/mL (3 mL)  Subcutaneous Insulin Pen INJECT 80 UNITS ONCE A DAY FOR 90 DAYS (Patient taking differently: Inject 100 Units under the skin Once a day)    nystatin (MYCOSTATIN) 100,000 unit/gram Cream Apply topically Twice daily    pantoprazole (PROTONIX) 40 mg Oral Tablet, Delayed Release (E.C.) Take 1 Tablet (40 mg total) by mouth Twice daily    Pen Needle, Disposable, (BD UF MINI PEN NEEDLE) 31 gauge x 3/16" Needle USE 5 TIMES A DAY    potassium citrate (UROCIT-K) 10 mEq (1,080 mg) Oral Tablet Sustained Release Take 1 Tablet (10 mEq total) by mouth Twice daily with food    pravastatin (PRAVACHOL) 20 mg Oral Tablet Take 1 Tablet (20 mg total) by mouth Every evening    prednisoLONE acetate (PRED FORTE) 1 % Ophthalmic Drops, Suspension Instill 1 Drop into both eyes Twice daily    SSD 1 % Cream APPLY AS DIRECTED ONCE A DAY TO HEEL AND ANKLE    tacrolimus (PROGRAF) 1 mg Oral Capsule TAKE THREE CAPSULES BY MOUTH TWICE A DAY FOR KIDNEY TRANSPLANT    testosterone (ANDROGEL) 20.25 mg/1.25 gram (1.62 %) Transdermal Gel in Metered-dose Pump Place 20.25 mg on the skin Once a day    trimethoprim-sulfamethoxazole (BACTRIM) 80-400mg  per tablet TAKE 1 TABLET 3 TIMES A WEEK    vitamin E 400 unit Oral Capsule Take 1 Capsule (400 Units total) by mouth Once a day        Chronic Disease Management-AMB  Fu visit    This pt  is for  fu today  of chronic problems     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 Cheri Kearns, antral ulcer 2016 nl HIDA 2017 nl abd ultrasound x for FLD    Anemia   fu  labs   h/h stable      atrial fibrillation  pt reports that took coumadin after transplant and recalls 2 yrs of heparin before hand when on dialysis. Pt thinks took coumadin for 9-12 mo after transplant  but pt does not recal  Nothing   done  since        cyst of kidney  1/20 pt contacted transplat team, they are not concerned about the 8 cm cyst at the time, pt scheduled for fu for Feb 5th 1/20 8 cm seen on transplanted kidney, sending to transplant team, need to verify that this is not worrisome    diabetes mellitus type 2  Pt has been in  [place] control but  Too much control  Pt's Aic 5.9%  Pt and I discussed how detrimental low BS can be   Insulin needs to be adjusted   Pt brings up medication oral agents he has been on in the past and he would like to be off insulin and oral agents only   However as he has had a renal transplant and can not take medications he was on in the past       No recent low  BS      diabetic peripheral neuropathy    essential hypertension  bp mild increase but at radiation  it is high   will add  clonidine   0.1   bid  and monitor        gastroesophageal reflux disease currently  stable     glaucoma  2020 pt has fu at [facility] severe Right, followed at [facility], last visit 5/17 sees every 11 mo.    Hx of his Herpes simplex    history of renal transplant left   dec   2005  [facility]    1/20 pt has fu Feb 5th 12/19 Cr 1.69 GFR 45 6/19 Cr 1.41 GFR 56 3/19 Cr 1.67 GFR 46 11/18 seen by Dr. Clyde Canterbury, renal fx worsening, no comment on cause 11/18 Cr 1.65 GFR 47    history of transplantation of pancreas  12/19 [place] nl 8.4 amylase and lipse nl PK txp 2005, pt reports had renal failure and was on HD, qualified for ki tp. 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    hyperlipidemia  Stable on fenofibrate and statin     legal blindness    obesity  12/18 pt would like referral for bariatric surgery, would like to be seen by [facility] because of his hx of transplant    obstructive sleep apnea syndrome  4/18 compliant with CPAP, using nasal pillow, MILD OSA 12/17 mild, will f/u with sleep med for CPAP, may be cause of fatigue (rather than low T)    referred to dr Truddie Crumble   and following  cpap    peptic  ulcer  9/18 seen by gen surg/[facility], EGD and sono pending 11/17 pathology report from EGD and bx shows gastric ulcer, no neoplasm, H pylori negative 3/18 will need to decide on f/u EGD with Dr. Pecola Leisure hx of Hiatal hernia    polyp of colon  had cscope 5/17, bx showed melanosis coli only, no colitis    prostate specific antigen abnormal  3/19 PSA 1.5 PSA rise 2016 to 2017 on testosterone, followed by urology/[place], last seen 3/18    testicular hypofunction  5/18 followed by urology, they will follow PSA and testosterone levels prescribed gel 5/18 PSA 1.4 (slightly lower than last) followed by urology 5/18 PSA velocity increase  Pt needs  refill on medication   lab  needed     Vitamin D deficiency  On supplement            REVIEW  OF SYSTEMS:   Review of Systems  General: No fever.  No chills.  No weight changes.  HEENT: No vision changes.  Cardiac: No chest pain. No palpitations.  No dizziness.  No light-headedness.  No near syncope.  Resp: No dyspnea at rest, no dyspnea on exertion; no cough or hemoptysis; no orthopnea or PND.  GI: No N/V. No melena.  No bright red blood per rectum.  Ext: No edema.  No claudication.  Neuro: No focal weakness.  No numbness.  All other ROS negative.      Objective:   BP (!) 140/82 (Site: Left, Patient Position: Sitting)   Pulse 82   SpO2 93%              PHYSICAL EXAM  Physical Exam  Gen: NAD. Alert. Upper lip  swelling and scabbed ouver  due to radiation    HEENT: PERRL; conjunctivae clear. No JVD or carotid bruit.  Cardiac: RRR with normal S1, S2.   Lungs: Clear to auscultation bilaterally. No rales. No wheezing. No rhonchi.  Abdomen: Soft, non-tender.non-distended  nl bowel sounds    Extremities: No edema. No cyanosis. No clubbing.  Neurologic:  Grossly intact    Lab Results   Component Value Date    CHOLESTEROL 176 05/09/2022    HDLCHOL 53 05/09/2022    LDLCHOL 98 05/09/2022    TRIG 127 05/09/2022       COMPLETE BLOOD COUNT   Lab Results   Component Value Date    WBC 6.4 05/09/2022     HGB 10.9 (L) 05/09/2022    HCT 32.3 (L) 05/09/2022    PLTCNT 245 05/09/2022    BANDS 3 (L) 09/09/2021       DIFFERENTIAL  Lab Results   Component Value Date    PMNS 65 05/09/2022    LYMPHOCYTES 25 05/09/2022    MYELOCYTES 3 09/08/2021    MONOCYTES 8 05/09/2022    EOSINOPHIL 3 05/09/2022    BASOPHILS 0 05/09/2022    BASOPHILS 0.00 05/09/2022    PMNABS 4.20 05/09/2022    LYMPHSABS 1.60 05/09/2022    EOSABS 0.20 05/09/2022    MONOSABS 0.50 05/09/2022        COMPREHENSIVE METABOLIC PANEL   Lab Results   Component Value Date    SODIUM 141 05/09/2022    POTASSIUM 4.4 05/09/2022    CHLORIDE 107 05/09/2022    CO2 26 05/09/2022    ANIONGAP 8 05/09/2022    BUN 26 (H) 05/09/2022    CREATININE 1.96 (H) 05/09/2022    GLUCOSENF 103 05/09/2022    CALCIUM 9.5 05/09/2022    ALBUMIN 4.0 05/09/2022    TOTALPROTEIN 6.7 05/09/2022    ALKPHOS 39 05/09/2022    AST 17 05/09/2022    ALT 17 05/09/2022            THYROID STIMULATING HORMONE  Lab Results   Component Value Date    TSH 3.716 05/09/2022        Lab Results   Component Value Date    HA1C 5.6 05/09/2022     Lab Results   Component Value Date    VITD 46 05/09/2022         Assessment/Plan  Problem List Items Addressed This Visit          Cardiovascular System    Mixed hyperlipidemia - Primary    Relevant Orders    CBC/DIFF    COMPREHENSIVE METABOLIC PNL, FASTING    THYROID STIMULATING HORMONE (SENSITIVE TSH)  LIPID PANEL    HGA1C (HEMOGLOBIN A1C WITH EST AVG GLUCOSE)    MAGNESIUM    Atrial fibrillation (CMS HCC)    Relevant Orders    CBC/DIFF    COMPREHENSIVE METABOLIC PNL, FASTING    THYROID STIMULATING HORMONE (SENSITIVE TSH)    LIPID PANEL    HGA1C (HEMOGLOBIN A1C WITH EST AVG GLUCOSE)    MAGNESIUM       Nephrology    Renal transplant recipient    Relevant Orders    CBC/DIFF    COMPREHENSIVE METABOLIC PNL, FASTING    THYROID STIMULATING HORMONE (SENSITIVE TSH)    LIPID PANEL    HGA1C (HEMOGLOBIN A1C WITH EST AVG GLUCOSE)    MAGNESIUM       Digestive    GERD (gastroesophageal  reflux disease)    Relevant Orders    CBC/DIFF    COMPREHENSIVE METABOLIC PNL, FASTING    THYROID STIMULATING HORMONE (SENSITIVE TSH)    LIPID PANEL    HGA1C (HEMOGLOBIN A1C WITH EST AVG GLUCOSE)    MAGNESIUM       Endocrine    Type 2 diabetes mellitus without complications (CMS HCC)    Relevant Orders    CBC/DIFF    COMPREHENSIVE METABOLIC PNL, FASTING    THYROID STIMULATING HORMONE (SENSITIVE TSH)    LIPID PANEL    HGA1C (HEMOGLOBIN A1C WITH EST AVG GLUCOSE)    MAGNESIUM       Other    Hypomagnesemia    Relevant Orders    CBC/DIFF    COMPREHENSIVE METABOLIC PNL, FASTING    THYROID STIMULATING HORMONE (SENSITIVE TSH)    LIPID PANEL    HGA1C (HEMOGLOBIN A1C WITH EST AVG GLUCOSE)    MAGNESIUM        Orders Placed This Encounter    CBC/DIFF    COMPREHENSIVE METABOLIC PNL, FASTING    THYROID STIMULATING HORMONE (SENSITIVE TSH)    LIPID PANEL    HGA1C (HEMOGLOBIN A1C WITH EST AVG GLUCOSE)    MAGNESIUM        Meds reviewed as well as labs.  Chart reviewed and updated.   Continue current treatment.  Keep follow-up appointment.   Vaccine hx reviewed.   Continue radiation  therapy   12/16   now with  Dr  Ned Grace        Labs  in fu  today     Add clonidine   0.1  bid    and monitor  BP

## 2022-10-21 ENCOUNTER — Other Ambulatory Visit (INDEPENDENT_AMBULATORY_CARE_PROVIDER_SITE_OTHER): Payer: Self-pay | Admitting: Internal Medicine

## 2022-10-21 ENCOUNTER — Inpatient Hospital Stay (HOSPITAL_BASED_OUTPATIENT_CLINIC_OR_DEPARTMENT_OTHER)
Admission: RE | Admit: 2022-10-21 | Discharge: 2022-10-21 | Disposition: A | Discharge status: Still In Hospital | Payer: Medicare Other | Source: Ambulatory Visit

## 2022-10-21 DIAGNOSIS — N1831 Chronic kidney disease, stage 3a (CMS HCC): Secondary | ICD-10-CM

## 2022-10-21 DIAGNOSIS — E119 Type 2 diabetes mellitus without complications: Secondary | ICD-10-CM

## 2022-10-21 LAB — HGA1C (HEMOGLOBIN A1C WITH EST AVG GLUCOSE): HEMOGLOBIN A1C: 5.9 % (ref 4.0–6.0)

## 2022-10-22 ENCOUNTER — Telehealth (INDEPENDENT_AMBULATORY_CARE_PROVIDER_SITE_OTHER): Payer: Self-pay | Admitting: Internal Medicine

## 2022-10-22 ENCOUNTER — Encounter (HOSPITAL_BASED_OUTPATIENT_CLINIC_OR_DEPARTMENT_OTHER): Payer: Self-pay

## 2022-10-22 ENCOUNTER — Emergency Department
Admission: EM | Admit: 2022-10-22 | Discharge: 2022-10-22 | Disposition: A | Discharge status: Home / Self Care | Payer: Medicare Other | Attending: Emergency Medicine | Admitting: Emergency Medicine

## 2022-10-22 ENCOUNTER — Inpatient Hospital Stay (HOSPITAL_BASED_OUTPATIENT_CLINIC_OR_DEPARTMENT_OTHER)
Admission: RE | Admit: 2022-10-22 | Discharge: 2022-10-22 | Disposition: A | Discharge status: Still In Hospital | Payer: Medicare Other | Source: Ambulatory Visit

## 2022-10-22 ENCOUNTER — Emergency Department (HOSPITAL_BASED_OUTPATIENT_CLINIC_OR_DEPARTMENT_OTHER): Payer: Medicare Other

## 2022-10-22 ENCOUNTER — Other Ambulatory Visit: Payer: Self-pay

## 2022-10-22 DIAGNOSIS — Y830 Surgical operation with transplant of whole organ as the cause of abnormal reaction of the patient, or of later complication, without mention of misadventure at the time of the procedure: Secondary | ICD-10-CM | POA: Insufficient documentation

## 2022-10-22 DIAGNOSIS — E860 Dehydration: Secondary | ICD-10-CM | POA: Insufficient documentation

## 2022-10-22 DIAGNOSIS — T8619 Other complication of kidney transplant: Secondary | ICD-10-CM | POA: Insufficient documentation

## 2022-10-22 DIAGNOSIS — E875 Hyperkalemia: Secondary | ICD-10-CM | POA: Insufficient documentation

## 2022-10-22 DIAGNOSIS — I10 Essential (primary) hypertension: Secondary | ICD-10-CM | POA: Insufficient documentation

## 2022-10-22 DIAGNOSIS — Z923 Personal history of irradiation: Secondary | ICD-10-CM | POA: Insufficient documentation

## 2022-10-22 DIAGNOSIS — N179 Acute kidney failure, unspecified: Secondary | ICD-10-CM | POA: Insufficient documentation

## 2022-10-22 DIAGNOSIS — C4400 Unspecified malignant neoplasm of skin of lip: Secondary | ICD-10-CM | POA: Insufficient documentation

## 2022-10-22 DIAGNOSIS — E119 Type 2 diabetes mellitus without complications: Secondary | ICD-10-CM | POA: Insufficient documentation

## 2022-10-22 LAB — CBC WITH DIFF
BASOPHIL #: 0.01 10*3/uL (ref 0.00–0.30)
BASOPHIL %: 0 % (ref 0–3)
EOSINOPHIL #: 0.25 10*3/uL (ref 0.00–0.80)
EOSINOPHIL %: 5 % (ref 0–7)
HCT: 30.5 % — ABNORMAL LOW (ref 42.0–51.0)
HGB: 10.2 g/dL — ABNORMAL LOW (ref 13.5–18.0)
LYMPHOCYTE #: 1.39 10*3/uL (ref 1.10–5.00)
LYMPHOCYTE %: 29 % (ref 25–45)
MCH: 32.2 pg — ABNORMAL HIGH (ref 27.0–32.0)
MCHC: 33.6 g/dL (ref 32.0–36.0)
MCV: 96 fL (ref 78.0–99.0)
MONOCYTE #: 0.43 10*3/uL (ref 0.00–1.30)
MONOCYTE %: 9 % (ref 0–12)
MPV: 10.4 fL (ref 7.4–10.4)
NEUTROPHIL #: 2.79 10*3/uL (ref 1.80–8.40)
NEUTROPHIL %: 57 % (ref 40–76)
PLATELETS: 258 10*3/uL (ref 140–440)
RBC: 3.18 10*6/uL — ABNORMAL LOW (ref 4.20–6.00)
RDW: 14.8 % (ref 11.6–14.8)
WBC: 4.9 10*3/uL (ref 4.0–10.5)

## 2022-10-22 LAB — COMPREHENSIVE METABOLIC PANEL, NON-FASTING
ALBUMIN/GLOBULIN RATIO: 0.9 (ref 0.8–1.4)
ALBUMIN: 3.2 g/dL — ABNORMAL LOW (ref 3.4–5.0)
ALKALINE PHOSPHATASE: 44 U/L — ABNORMAL LOW (ref 46–116)
ALT (SGPT): 42 U/L (ref <=78)
ANION GAP: 7 mmol/L (ref 4–13)
AST (SGOT): 69 U/L — ABNORMAL HIGH (ref 15–37)
BILIRUBIN TOTAL: 0.5 mg/dL (ref 0.2–1.0)
BUN/CREA RATIO: 13
BUN: 37 mg/dL — ABNORMAL HIGH (ref 7–18)
CALCIUM, CORRECTED: 9.4 mg/dL
CALCIUM: 8.8 mg/dL (ref 8.5–10.1)
CHLORIDE: 103 mmol/L (ref 98–107)
CO2 TOTAL: 27 mmol/L (ref 21–32)
CREATININE: 2.75 mg/dL — ABNORMAL HIGH (ref 0.70–1.30)
ESTIMATED GFR: 26 mL/min/{1.73_m2} — ABNORMAL LOW (ref >59)
GLOBULIN: 3.6
GLUCOSE: 146 mg/dL — ABNORMAL HIGH (ref 74–106)
OSMOLALITY, CALCULATED: 285 mOsm/kg (ref 270–290)
POTASSIUM: 5.8 mmol/L — ABNORMAL HIGH (ref 3.5–5.1)
PROTEIN TOTAL: 6.8 g/dL (ref 6.4–8.2)
SODIUM: 137 mmol/L (ref 136–145)

## 2022-10-22 LAB — URINALYSIS, MACRO/MICRO
BILIRUBIN: NEGATIVE mg/dL
BLOOD: NEGATIVE mg/dL
GLUCOSE: NEGATIVE mg/dL
KETONES: NEGATIVE mg/dL
LEUKOCYTES: NEGATIVE WBCs/uL
NITRITE: NEGATIVE
PH: 5.5 (ref 4.6–8.0)
PROTEIN: 100 mg/dL — AB
SPECIFIC GRAVITY: 1.025 (ref 1.003–1.035)
UROBILINOGEN: 0.2 mg/dL (ref 0.2–1.0)

## 2022-10-22 LAB — PT/INR
INR: 0.91 (ref 0.84–1.10)
PROTHROMBIN TIME: 10.7 seconds (ref 9.8–12.7)

## 2022-10-22 LAB — BASIC METABOLIC PANEL
ANION GAP: 10 mmol/L (ref 4–13)
BUN/CREA RATIO: 14
BUN: 37 mg/dL — ABNORMAL HIGH (ref 7–18)
CALCIUM: 8.7 mg/dL (ref 8.5–10.1)
CHLORIDE: 105 mmol/L (ref 98–107)
CO2 TOTAL: 25 mmol/L (ref 21–32)
CREATININE: 2.63 mg/dL — ABNORMAL HIGH (ref 0.70–1.30)
ESTIMATED GFR: 27 mL/min/{1.73_m2} — ABNORMAL LOW (ref >59)
GLUCOSE: 142 mg/dL — ABNORMAL HIGH (ref 74–106)
OSMOLALITY, CALCULATED: 291 mOsm/kg — ABNORMAL HIGH (ref 270–290)
POTASSIUM: 4.2 mmol/L (ref 3.5–5.1)
SODIUM: 140 mmol/L (ref 136–145)

## 2022-10-22 LAB — LIPASE: LIPASE: 55 U/L (ref 16–77)

## 2022-10-22 LAB — URINALYSIS, MICROSCOPIC

## 2022-10-22 LAB — PTT (PARTIAL THROMBOPLASTIN TIME): APTT: 35.2 seconds (ref 25.0–38.0)

## 2022-10-22 MED ORDER — SODIUM CHLORIDE 0.9 % IV BOLUS
1000.0000 mL | INJECTION | Status: AC
Start: 2022-10-22 — End: 2022-10-22
  Administered 2022-10-22: 0 mL via INTRAVENOUS
  Administered 2022-10-22: 1000 mL via INTRAVENOUS

## 2022-10-22 MED ORDER — SODIUM CHLORIDE 0.9 % IV BOLUS
1000.0000 mL | INJECTION | Status: AC
Start: 2022-10-22 — End: 2022-10-22
  Administered 2022-10-22: 1000 mL via INTRAVENOUS
  Administered 2022-10-22: 0 mL via INTRAVENOUS

## 2022-10-22 MED ORDER — SODIUM ZIRCONIUM CYCLOSILICATE 10 GRAM ORAL POWDER PACKET
10.0000 g | Freq: Every day | ORAL | Status: DC
Start: 2022-10-24 — End: 2022-10-22

## 2022-10-22 MED ORDER — SODIUM BICARBONATE 8.4 % (1 MEQ/ML) INTRAVENOUS SYRINGE
50.0000 meq | INJECTION | INTRAVENOUS | Status: AC
Start: 2022-10-22 — End: 2022-10-22
  Administered 2022-10-22: 50 meq via INTRAVENOUS

## 2022-10-22 MED ORDER — SODIUM BICARBONATE 8.4 % (1 MEQ/ML) INTRAVENOUS SYRINGE
INJECTION | INTRAVENOUS | Status: AC
Start: 2022-10-22 — End: 2022-10-22
  Filled 2022-10-22: qty 50

## 2022-10-22 MED ORDER — SODIUM ZIRCONIUM CYCLOSILICATE 10 GRAM ORAL POWDER PACKET
10.0000 g | Freq: Three times a day (TID) | ORAL | Status: DC
Start: 2022-10-22 — End: 2022-10-22
  Administered 2022-10-22: 10 g via ORAL

## 2022-10-22 MED ORDER — SODIUM ZIRCONIUM CYCLOSILICATE 10 GRAM ORAL POWDER PACKET
ORAL | Status: AC
Start: 2022-10-22 — End: 2022-10-22
  Filled 2022-10-22: qty 1

## 2022-10-22 NOTE — ED Nurses Note (Signed)
Agree with A. Mitchem Nurse Extern assessment.

## 2022-10-22 NOTE — ED Nurses Note (Signed)
Assessment by NE completed with primary nurse in room.

## 2022-10-22 NOTE — ED Triage Notes (Signed)
Patient states that he was called and told to come in by PCP after his radiation treatment so he could get some fluids.  States that it would all be in his chart.

## 2022-10-22 NOTE — ED Nurses Note (Signed)
Sandwich tray given for lunch

## 2022-10-22 NOTE — ED Nurses Note (Signed)
Patient discharged home with family.  AVS reviewed with patient/care giver.  A written copy of the AVS and discharge instructions was given to the patient/care giver.  Questions sufficiently answered as needed.  Patient/care giver encouraged to follow up with PCP as indicated.  In the event of an emergency, patient/care giver instructed to call 911 or go to the nearest emergency room.

## 2022-10-22 NOTE — ED Provider Notes (Signed)
[facility] Emergency Department  ED Primary Provider Note  History of Present Illness   Chief Complaint   Patient presents with    Abnormal Lab Result     Martin Porter is a 57 y.o. male who had concerns including Abnormal Lab Result.  Arrival: The patient arrived by Car complaining just having his lab work drawn 2 days ago and then was called this morning by his PCP to go to the ER and get fluids.  Apparently the patient has elevated creatinine with an elevated potassium of 5.4.  Patient does have transplanted kidney as well as pancreas in the past.  Patient states he feels great.  He has been drinking fluids daily.  He denies any nausea vomiting or diarrhea.  No fever chills.  Denies any abdominal pain.  Patient does have a history of hypertension as well as diabetes.  Patient also has had peptic ulcers in the past as well as pancreatitis.  Patient does suffer from gastroesophageal reflux disease as well.  Patient has a history of atrial fibrillation.  Patient is not presently on any blood thinner.  Patient denies any pain in his abdomen.  He denies any fever chills.  He denies any flu-like symptoms.    HPI  Review of Systems   Review of Systems   Constitutional:  Negative for chills and fever.   HENT:  Negative for ear pain and sore throat.    Eyes:  Negative for pain and visual disturbance.   Respiratory:  Negative for cough and shortness of breath.    Cardiovascular:  Negative for chest pain and palpitations.   Gastrointestinal:  Negative for abdominal pain and vomiting.   Genitourinary:  Negative for dysuria and hematuria.   Musculoskeletal:  Negative for arthralgias and back pain.   Skin:  Negative for color change and rash.   Neurological:  Negative for seizures and syncope.   All other systems reviewed and are negative.     Historical Data   History Reviewed This Encounter: Medical History  Surgical History  Family History  Social History    Physical Exam    ED Triage Vitals [10/22/22 1007]   BP (Non-Invasive) (!) 154/74   Heart Rate 72   Respiratory Rate 18   Temperature 36.4 C (97.5 F)   SpO2 98 %   Weight (!) 141 kg (310 lb)   Height 1.854 m (6\' 1" )     Physical Exam  Vitals and nursing note reviewed.   Constitutional:       General: He is not in acute distress.     Appearance: He is well-developed. He is obese.   HENT:      Head: Normocephalic and atraumatic.      Right Ear: External ear normal.      Left Ear: External ear normal.      Nose: Nose normal.      Mouth/Throat:      Mouth: Mucous membranes are dry.   Eyes:      Extraocular Movements: Extraocular movements intact.      Conjunctiva/sclera: Conjunctivae normal.      Pupils: Pupils are equal, round, and reactive to light.   Cardiovascular:      Rate and Rhythm: Normal rate and regular rhythm.      Pulses: Normal pulses.      Heart sounds: Normal heart sounds. No murmur heard.  Pulmonary:      Effort: Pulmonary effort is normal. No respiratory distress.  Breath sounds: Normal breath sounds.   Abdominal:      General: Bowel sounds are normal.      Palpations: Abdomen is soft.      Tenderness: There is no abdominal tenderness.   Musculoskeletal:         General: No swelling. Normal range of motion.      Cervical back: Normal range of motion and neck supple.   Skin:     General: Skin is warm and dry.      Capillary Refill: Capillary refill takes less than 2 seconds.   Neurological:      General: No focal deficit present.      Mental Status: He is alert and oriented to person, place, and time.   Psychiatric:         Mood and Affect: Mood normal.         Behavior: Behavior normal.         Thought Content: Thought content normal.       Patient Data     Labs Ordered/Reviewed   COMPREHENSIVE METABOLIC PANEL, NON-FASTING - Abnormal; Notable for the following components:       Result Value    POTASSIUM 5.8 (*)     BUN 37 (*)     CREATININE 2.75 (*)     ESTIMATED GFR 26 (*)     ALBUMIN 3.2 (*)     GLUCOSE 146  (*)     ALKALINE PHOSPHATASE 44 (*)     AST (SGOT) 69 (*)     All other components within normal limits    Narrative:     Estimated Glomerular Filtration Rate (eGFR) is calculated using the CKD-EPI (2021) equation, intended for patients 14 years of age and older. If gender is not documented or "unknown", there will be no eGFR calculation.   CBC WITH DIFF - Abnormal; Notable for the following components:    RBC 3.18 (*)     HGB 10.2 (*)     HCT 30.5 (*)     MCH 32.2 (*)     All other components within normal limits   URINALYSIS, MACRO/MICRO - Abnormal; Notable for the following components:    COLOR Light Yellow (*)     APPEARANCE Slightly Hazy (*)     PROTEIN 100 (*)     All other components within normal limits   URINALYSIS, MICROSCOPIC - Abnormal; Notable for the following components:    BACTERIA Few (*)     MUCOUS Moderate (*)     WBCS 6-10 (*)     SQUAMOUS EPITHELIAL Several (*)     All other components within normal limits   BASIC METABOLIC PANEL - Abnormal; Notable for the following components:    GLUCOSE 142 (*)     BUN 37 (*)     CREATININE 2.63 (*)     ESTIMATED GFR 27 (*)     OSMOLALITY, CALCULATED 291 (*)     All other components within normal limits    Narrative:     Estimated Glomerular Filtration Rate (eGFR) is calculated using the CKD-EPI (2021) equation, intended for patients 41 years of age and older. If gender is not documented or "unknown", there will be no eGFR calculation.   LIPASE - Normal   PT/INR - Normal    Narrative:     INR OF 2.0-3.0  RECOMMENDED FOR: PROPHYLAXIS/TREATMENT OF VENEOUS THROMBOSIS, PULMONARY EMBOLISM, PREVENTION OF SYSTEMIC EMBOLISM FROM ATRIAL FIBRILATION, MYOCARDIAL INFARCTION.    INR OF 2.5-3.5  RECOMMENDED FOR MECHANICAL PROSTHETIC HEART VALVES, RECURRENT SYSTEMIC EMBOLISM, RECURRENT MYOCARDIAL INFARCTION.     PTT (PARTIAL THROMBOPLASTIN TIME) - Normal   CBC/DIFF    Narrative:     The following orders were created for panel order CBC/DIFF.  Procedure                                Abnormality         Status                     ---------                               -----------         ------                     CBC WITH ZOXW[[id number]]                Abnormal            Final result                 Please view results for these tests on the individual orders.   URINALYSIS WITH REFLEX MICROSCOPIC AND CULTURE IF POSITIVE    Narrative:     The following orders were created for panel order URINALYSIS WITH REFLEX MICROSCOPIC AND CULTURE IF POSITIVE.  Procedure                               Abnormality         Status                     ---------                               -----------         ------                     URINALYSIS, MACRO/MICRO[[id number]]      Abnormal            Final result               URINALYSIS, MICROSCOPIC[[id number]]      Abnormal            Final result                 Please view results for these tests on the individual orders.     XR ABD FLAT AND UPRIGHT SERIES (W PA CHEST)   Final Result by Edi, Radresults In (06/19 1117)   NEGATIVE ACUTE ABDOMINAL SERIES.            Radiologist location ID: [id number]           Medical Decision Making        Medical Decision Making  Patient is 57 year old white male who comes in complaining of being told he was to go to the ER and get fluids for his dehydration.  Patient has been receiving radiation therapy to his upper lip for skin cancer.  Patient states he has been drinking plenty of fluids daily.  Patient is transplant recipient for kidney as well as pancreas.  He has had both for upwards of  10 years.  Does have a history of hypertension as well as diabetes.  Patient also has had AFib in the past but is not on any blood thinners presently.  Patient states he feels fine.  He denies any nausea vomiting or diarrhea.  No polydipsia or poly urea.  Patient denies any dysuria or increased frequency.  No fever chills.  Patient will have an IV placed with fluids for hydration.  He will also have labs and x-ray of his chest  abdomen pelvis.  Patient will be treated for results and possibly discharged home.  Depending on his kidney status will depend on whether he needs to be admitted.  He if he does need to be admitted he has expressed that he only wants to go to [facility] where he had his transplant surgery.      Patient received 1 full L fluid and then BmP was ordered.  Repeat BmP showed a decrease in the creatinine 2.63.  The BUN remain the same at 37.  Patient is potassium dropped from 5.8 to 4.2.  Patient is receiving a 2 L of fluid and then once done patient will be discharged home to see his PMD in the next 2 days.  Patient did respond to fluids with improvement of his renal status.  Patient is on iatrogenic potassium consumption daily.  It does not appear that the patient is having kidney rejection nor pancreas rejection.    Amount and/or Complexity of Data Reviewed  Labs: ordered.  Radiology: ordered.    Risk  Prescription drug management.             Medications Administered in the ED   sodium zirconium cyclosilicate (LOKELMA) powder (10 g Oral Given 10/22/22 1148)     Followed by   sodium zirconium cyclosilicate (LOKELMA) powder (has no administration in time range)   NS bolus infusion 1,000 mL (0 mL Intravenous Stopped 10/22/22 1230)   NS bolus infusion 1,000 mL (0 mL Intravenous Stopped 10/22/22 1329)   sodium bicarbonate 1 mEq/mL 50 mL injection (50 mEq Intravenous Given 10/22/22 1148)     Clinical Impression   Stage 2 acute kidney injury (CMS HCC) (Primary)   Hyperkalemia   Dehydration       Disposition: Discharged               Clinical Impression   Stage 2 acute kidney injury (CMS HCC) (Primary)   Hyperkalemia   Dehydration       Current Discharge Medication List

## 2022-10-23 ENCOUNTER — Inpatient Hospital Stay (HOSPITAL_BASED_OUTPATIENT_CLINIC_OR_DEPARTMENT_OTHER)
Admission: RE | Admit: 2022-10-23 | Discharge: 2022-10-23 | Disposition: A | Discharge status: Still In Hospital | Payer: Medicare Other | Source: Ambulatory Visit

## 2022-10-23 NOTE — Telephone Encounter (Signed)
Pt went to see Martin Porter today and took off K and bactriun  labs on Monday and recheck 4 weeks  [name] noitifed  does not need to see him right now

## 2022-10-24 ENCOUNTER — Other Ambulatory Visit: Payer: Self-pay

## 2022-10-24 ENCOUNTER — Inpatient Hospital Stay (HOSPITAL_BASED_OUTPATIENT_CLINIC_OR_DEPARTMENT_OTHER)
Admission: RE | Admit: 2022-10-24 | Discharge: 2022-10-24 | Disposition: A | Discharge status: Still In Hospital | Payer: Medicare Other | Source: Ambulatory Visit

## 2022-10-27 ENCOUNTER — Other Ambulatory Visit: Payer: Self-pay

## 2022-10-27 ENCOUNTER — Other Ambulatory Visit (HOSPITAL_BASED_OUTPATIENT_CLINIC_OR_DEPARTMENT_OTHER): Payer: Medicare Other

## 2022-10-27 ENCOUNTER — Inpatient Hospital Stay
Admission: RE | Admit: 2022-10-27 | Discharge: 2022-10-27 | Disposition: A | Discharge status: Still In Hospital | Payer: Medicare Other | Source: Ambulatory Visit | Attending: Internal Medicine | Admitting: Internal Medicine

## 2022-10-27 DIAGNOSIS — E875 Hyperkalemia: Secondary | ICD-10-CM | POA: Insufficient documentation

## 2022-10-27 LAB — RENAL FUNCTION PANEL
ALBUMIN: 3.9 g/dL (ref 3.5–5.7)
ANION GAP: 7 mmol/L (ref 4–13)
BUN: 34 mg/dL — ABNORMAL HIGH (ref 7–25)
CALCIUM, CORRECTED: 9.7 mg/dL (ref 8.9–10.8)
CALCIUM: 9.6 mg/dL (ref 8.6–10.3)
CHLORIDE: 105 mmol/L (ref 98–107)
CO2 TOTAL: 28 mmol/L (ref 21–31)
CREATININE: 2.51 mg/dL — ABNORMAL HIGH (ref 0.60–1.30)
ESTIMATED GFR: 29 mL/min/{1.73_m2} — ABNORMAL LOW (ref >59)
GLUCOSE: 149 mg/dL — ABNORMAL HIGH (ref 74–109)
OSMOLALITY, CALCULATED: 290 mOsm/kg (ref 270–290)
PHOSPHORUS: 3.3 mg/dL — ABNORMAL LOW (ref 3.7–7.2)
POTASSIUM: 4.2 mmol/L (ref 3.5–5.1)
SODIUM: 140 mmol/L (ref 136–145)

## 2022-11-10 ENCOUNTER — Other Ambulatory Visit (INDEPENDENT_AMBULATORY_CARE_PROVIDER_SITE_OTHER): Payer: Self-pay | Admitting: Internal Medicine

## 2022-11-12 ENCOUNTER — Inpatient Hospital Stay
Admission: RE | Admit: 2022-11-12 | Discharge: 2022-11-12 | Disposition: A | Discharge status: Still In Hospital | Payer: Medicare Other | Source: Ambulatory Visit | Attending: Internal Medicine | Admitting: Internal Medicine

## 2022-11-12 ENCOUNTER — Other Ambulatory Visit: Payer: Self-pay

## 2022-11-12 NOTE — Progress Notes (Signed)
Name: Martin Porter MRN:  [id number]   Date: 11/12/2022 Age: 57 y.o.       TUMOR: Basal cell carcinoma of the lip; definitive radiation completed 2 weeks ago.      INTERVAL ONCOLOGIC HISTORY:  Martin, Porter returns on a follow up visit.  He completed his definitive radiotherapy few weeks ago.  He reports no new pain but some mild tenderness on the upper lip.    OTHER MEDICAL PROBLEMS:     There are no exam notes on file for this visit.     PAST MEDICAL HISTORY    Past Medical History:   Diagnosis Date    Abnormal prostate specific antigen     Anemia, unspecified     Atrial fibrillation (CMS HCC)     Candida infection     CKD (chronic kidney disease) stage 3, GFR 30-59 ml/min (CMS HCC)     Cyst of kidney, acquired     Diabetic peripheral neuropathy (CMS HCC)     GERD (gastroesophageal reflux disease)     Herpes simplex     Hiatal hernia     Hypogonadism in male     Hypomagnesemia     Impotence     Legal blindness     Mixed hyperlipidemia     Obesity, unspecified     12/18 pt would like referral for bariatric surgery, would like to be seen by Martin Porter because of his hx of transplant    Obstructive sleep apnea syndrome     4/18 compliant with CPAP, using nasal PILLOW MILD OSA. 12/17 MILD, WILL FOLLOW UP WITH SLEEP MED FOR CPAP MAY  BE BECAUSE FATIGUE    Peptic ulcer     9/18 seen by gen surg/[facility], EGD and sono pending gastric ulcer, no neoplasm, H pylori negative 3/18 will need to decide on f/u EGD with Martin Porter    Polyp of colon     Renal transplant recipient     Testicular hypofunction     5/18 followed by urology, they will follow PSA and testosterone levels prescribed gel 5/18 PSA 1.4 (slightly lower than last) followed by urology 5/18 PSA velocity increased form 2016-2017, testosterone held, was to have repeat PSA/VitD and T levels in our clinic and CC to Dr. Ricki Porter Right testicular hypotrophy    Tobacco dependence syndrome     Type 2 diabetes mellitus without complications (CMS HCC)     Unspecified  glaucoma(365.9)     Vitamin D deficiency         ALLERGIES:   No Known Allergies     MEDICATIONS:   Current Outpatient Medications   Medication Instructions    acetaminophen (TYLENOL) 500 mg, Oral, EVERY 4 HOURS PRN    amLODIPine (NORVASC) 5 mg, Oral, 2 TIMES DAILY    aspirin (ECOTRIN) 81 mg, Oral    atenoloL (TENORMIN) 50 mg, Oral, 2 TIMES DAILY    atropine (ISOPTO ATROPINE) 1 % Ophthalmic Drops 1 Drop, 4 TIMES DAILY    carvediloL (COREG) 25 mg, Oral, 2 TIMES DAILY WITH FOOD    cholecalciferol (vitamin D3) 50,000 Units, Oral, EVERY 7 DAYS    cloNIDine HCL (CATAPRES) 0.1 mg, Oral, 2 TIMES DAILY    cyanocobalamin (VITAMIN B 12) 2,000 mcg, Oral    docusate sodium (COLACE) 100 mg, Oral    dorzolamide-timoloL (COSOPT) 22.3-6.8 mg/mL Ophthalmic Drops 1 Drop, Left Eye, 2 TIMES DAILY    ergocalciferol (vitamin D2) (DRISDOL) 50,000 Units, Oral, EVERY 7 DAYS    famotidine (  PEPCID) 40 mg, Oral, 2 TIMES DAILY    fenofibrate (LOFIBRA) 160 mg, Oral, DAILY    furosemide (LASIX) 20 mg, Oral, DAILY    gabapentin (NEURONTIN) 800 mg Oral Tablet TAKE ONE TABLET BY MOUTH THREE TIMES A DAY    hydroCHLOROthiazide (HYDRODIURIL) 25 mg, Oral, DAILY    insulin glargine (BASAGLAR KWIKPEN U-100 INSULIN) 100 unit/mL Subcutaneous Insulin Pen INJECT 95 UNITS UNDER THE SKIN AT BEDTIME    iron ps complex-B12-folic acid (POLY-IRON 150 FORTE) 150-25-1 mg-mcg-mg Oral Capsule 2 Capsules, Oral, DAILY    magnesium oxide (MAG-OX) 400 mg, Oral, 3 TIMES DAILY    multivitamin with iron Oral Tablet 1 Tablet, Oral, DAILY    mycophenolate sodium (MYFORTIC) 540 mg, Oral, 2 TIMES DAILY    NOVOLOG FLEXPEN U-100 INSULIN 100 unit/mL (3 mL) Subcutaneous Insulin Pen INJECT 80 UNITS ONCE A DAY FOR 90 DAYS    nystatin (MYCOSTATIN) 100,000 unit/gram Cream Apply Topically, 2 TIMES DAILY    pantoprazole (PROTONIX) 40 mg, Oral, 2 TIMES DAILY    Pen Needle, Disposable, (BD UF MINI PEN NEEDLE) 31 gauge x 3/16" Needle USE 5 TIMES A DAY    potassium citrate (UROCIT-K) 10 mEq  (1,080 mg) Oral Tablet Sustained Release 10 mEq, Oral, 2 TIMES DAILY WITH FOOD    pravastatin (PRAVACHOL) 20 mg, Oral, EVERY EVENING    prednisoLONE acetate (PRED FORTE) 1 % Ophthalmic Drops, Suspension 1 Drop, Both Eyes, 2 TIMES DAILY    SSD 1 % Cream APPLY AS DIRECTED ONCE A DAY TO HEEL AND ANKLE    tacrolimus (PROGRAF) 1 mg Oral Capsule TAKE THREE CAPSULES BY MOUTH TWICE A DAY FOR KIDNEY TRANSPLANT    testosterone (ANDROGEL) 20.25 mg, Transdermal, DAILY    trimethoprim-sulfamethoxazole (BACTRIM) 80-400mg  per tablet TAKE 1 TABLET 3 TIMES A WEEK    vitamin E 400 Units, Oral, DAILY        INTERVAL SURGERIES:   Past Surgical History:   Procedure Laterality Date    COLONOSCOPY      DEBRIDEMENT  FOOT Right     EYE SURGERY      FOOT SURGERY      HX HERNIA REPAIR      HX RENAL TRANSPLANT      1/20 pt has fu Feb 5th 12/19 Cr 1.69 GFR 45 6/19 Cr 1.41 GFR 56 3/19 Cr 1.67 GFR 46 11/18 seen by Martin Porter, renal fx worsening, no comment on cause 11/18 Cr 1.65 GFR 478/18 Cr 1.95 GFR 38 6/18 Cr 1.5, Ca mildly high, SPE P/UPEPpending and asap f/u with nephrology 5/18 back to baseline cr 1.2 and GFR 68 CKD GFR 59 5/18 and was 59 11/17. Last Creat 1.36 increased from 1.2 11/17.    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 It seems that pt underwent  pancreas tp to "cure" DM/islet cells  a few years ago pt seen by endo and given OHG had pancreatitis, thought due to the DM medicines    SHOULDER SURGERY      SURGERY OF LIP      basal cell ca    Radiation   16   tx Dr  Martin Porter        FAMILY HISTORY: Reviewed and unchanged    SOCIAL  HISTORY: Reviewed and unchanged    REVIEW OF SYSTEMS  Pertinent review of systems as discussed in interval oncological history      PHYSICAL EXAMINATION  Physical Exam  HENT:      Mouth/Throat:      Mouth: Mucous  membranes are moist.      Comments: Improving area on central upper lip but small eschar still present         RADIOGRAPHS:   None  LABORATORY RESULT:  None  ASSESSMENT & PLAN  Joachim, Mashni L healing from his radiation.  He will continue his topical ointments and return to see [place] in another month with instructions to call in the interim should he have problems or concerns.    Molli Barrows, MD  11/12/2022 10:42     JX:[email or web address]

## 2022-12-09 ENCOUNTER — Other Ambulatory Visit (INDEPENDENT_AMBULATORY_CARE_PROVIDER_SITE_OTHER): Payer: Self-pay | Admitting: Internal Medicine

## 2022-12-12 ENCOUNTER — Other Ambulatory Visit (INDEPENDENT_AMBULATORY_CARE_PROVIDER_SITE_OTHER): Payer: Self-pay | Admitting: Internal Medicine

## 2022-12-12 ENCOUNTER — Ambulatory Visit (HOSPITAL_BASED_OUTPATIENT_CLINIC_OR_DEPARTMENT_OTHER): Payer: Self-pay

## 2022-12-12 MED ORDER — BASAGLAR KWIKPEN U-100 INSULIN 100 UNIT/ML (3 ML) SUBCUTANEOUS
PEN_INJECTOR | SUBCUTANEOUS | 5 refills | Status: DC
Start: 2022-12-12 — End: 2023-06-15

## 2022-12-17 ENCOUNTER — Other Ambulatory Visit: Payer: Self-pay

## 2022-12-17 ENCOUNTER — Ambulatory Visit
Admission: RE | Admit: 2022-12-17 | Discharge: 2022-12-17 | Disposition: A | Discharge status: Home / Self Care | Payer: Medicare Other | Source: Ambulatory Visit | Attending: RADIATION ONCOLOGY | Admitting: RADIATION ONCOLOGY

## 2022-12-17 DIAGNOSIS — Z923 Personal history of irradiation: Secondary | ICD-10-CM | POA: Insufficient documentation

## 2022-12-17 DIAGNOSIS — Z9889 Other specified postprocedural states: Secondary | ICD-10-CM | POA: Insufficient documentation

## 2022-12-17 DIAGNOSIS — C4401 Basal cell carcinoma of skin of lip: Secondary | ICD-10-CM | POA: Insufficient documentation

## 2022-12-17 NOTE — Progress Notes (Signed)
Name: Martin Porter MRN:  [id number]   Date: 12/17/2022 Age: 57 y.o.     Martin Porter     TREATMENT INTERVAL: 1 month    INTERVAL ONCOLOGIC HISTORY:  Martin Porter, Martin Porter is a pleasant 57 year old with basal cell carcinoma of the upper Porter.  He received radiation treatments and returns in follow up.  He reports he is doing well but is somewhat bothered by the loss of skin of his mustache.  His Porter is not bothering him.    OTHER MEDICAL PROBLEMS:     There are no exam notes on file for this visit.     PAST MEDICAL HISTORY    Past Medical History:   Diagnosis Date    Abnormal prostate specific antigen     Anemia, unspecified     Atrial fibrillation (CMS HCC)     Candida infection     CKD (chronic kidney disease) stage 3, GFR 30-59 ml/min (CMS HCC)     Cyst of kidney, acquired     Diabetic peripheral neuropathy (CMS HCC)     GERD (gastroesophageal reflux disease)     Herpes simplex     Hiatal hernia     Hypogonadism in male     Hypomagnesemia     Impotence     Legal blindness     Mixed hyperlipidemia     Obesity, unspecified     12/18 pt would like referral for bariatric surgery, would like to be seen by [facility] because of his hx of transplant    Obstructive sleep apnea syndrome     4/18 compliant with CPAP, using nasal PILLOW MILD OSA. 12/17 MILD, WILL FOLLOW UP WITH SLEEP MED FOR CPAP MAY  BE BECAUSE FATIGUE    Peptic ulcer     9/18 seen by gen surg/Reese, EGD and sono pending gastric ulcer, no neoplasm, H pylori negative 3/18 will need to decide on f/u EGD with Dr. Despina Arias    Polyp of colon     Renal transplant recipient     Testicular hypofunction     5/18 followed by urology, they will follow PSA and testosterone levels prescribed gel 5/18 PSA 1.4 (slightly lower than last) followed by urology 5/18 PSA velocity increased form 2016-2017, testosterone held, was to have repeat PSA/VitD and T levels in our clinic and CC to Dr. Ricki Miller Right testicular hypotrophy    Tobacco dependence syndrome      Type 2 diabetes mellitus without complications (CMS HCC)     Unspecified glaucoma(365.9)     Vitamin D deficiency         ALLERGIES:   No Known Allergies     MEDICATIONS:   Current Outpatient Medications   Medication Instructions    acetaminophen (TYLENOL) 500 mg, Oral, EVERY 4 HOURS PRN    amLODIPine (NORVASC) 5 mg, Oral, 2 TIMES DAILY    aspirin (ECOTRIN) 81 mg, Oral    atenoloL (TENORMIN) 50 mg, Oral, 2 TIMES DAILY    atropine (ISOPTO ATROPINE) 1 % Ophthalmic Drops 1 Drop, 4 TIMES DAILY    carvediloL (COREG) 25 mg, Oral, 2 TIMES DAILY WITH FOOD    cholecalciferol (vitamin D3) 50,000 Units, Oral, EVERY 7 DAYS    cloNIDine HCL (CATAPRES) 0.1 mg, Oral, 2 TIMES DAILY    cyanocobalamin (VITAMIN B 12) 2,000 mcg, Oral    docusate sodium (COLACE) 100 mg, Oral    dorzolamide-timoloL (COSOPT) 22.3-6.8 mg/mL Ophthalmic Drops 1 Drop, Left Eye, 2 TIMES DAILY  ergocalciferol (vitamin D2) (DRISDOL) 50,000 Units, Oral, EVERY 7 DAYS    famotidine (PEPCID) 40 mg, Oral, 2 TIMES DAILY    fenofibrate (LOFIBRA) 160 mg, Oral, DAILY    furosemide (LASIX) 20 mg, Oral, DAILY    gabapentin (NEURONTIN) 800 mg Oral Tablet TAKE ONE TABLET BY MOUTH THREE TIMES A DAY    hydroCHLOROthiazide (HYDRODIURIL) 25 mg, Oral, DAILY    insulin glargine (BASAGLAR KWIKPEN U-100 INSULIN) 100 unit/mL Subcutaneous Insulin Pen INJECT 95 UNITS UNDER THE SKIN AT BEDTIME    iron ps complex-B12-folic acid (POLY-IRON 150 FORTE) 150-25-1 mg-mcg-mg Oral Capsule 2 Capsules, Oral, DAILY    magnesium oxide (MAG-OX) 400 mg, Oral, 3 TIMES DAILY    multivitamin with iron Oral Tablet 1 Tablet, Oral, DAILY    mycophenolate sodium (MYFORTIC) 540 mg, Oral, 2 TIMES DAILY    NOVOLOG FLEXPEN U-100 INSULIN 100 unit/mL (3 mL) Subcutaneous Insulin Pen INJECT 80 UNITS ONCE A DAY FOR 90 DAYS    nystatin (MYCOSTATIN) 100,000 unit/gram Cream Apply Topically, 2 TIMES DAILY    pantoprazole (PROTONIX) 40 mg, Oral, 2 TIMES DAILY    Pen Needle, Disposable, (BD UF MINI PEN NEEDLE) 31  gauge x 3/16" Needle USE 5 TIMES A DAY    potassium citrate (UROCIT-K) 10 mEq (1,080 mg) Oral Tablet Sustained Release 10 mEq, Oral, 2 TIMES DAILY WITH FOOD    pravastatin (PRAVACHOL) 20 mg, Oral, EVERY EVENING    prednisoLONE acetate (PRED FORTE) 1 % Ophthalmic Drops, Suspension 1 Drop, Both Eyes, 2 TIMES DAILY    SSD 1 % Cream APPLY AS DIRECTED ONCE A DAY TO HEEL AND ANKLE    tacrolimus (PROGRAF) 1 mg Oral Capsule TAKE THREE CAPSULES BY MOUTH TWICE A DAY FOR KIDNEY TRANSPLANT    testosterone (ANDROGEL) 20.25 mg, Transdermal, DAILY    trimethoprim-sulfamethoxazole (BACTRIM) 80-400mg  per tablet TAKE 1 TABLET 3 TIMES A WEEK    vitamin E 400 Units, Oral, DAILY        INTERVAL SURGERIES:   Past Surgical History:   Procedure Laterality Date    COLONOSCOPY      DEBRIDEMENT  FOOT Right     EYE SURGERY      FOOT SURGERY      HX HERNIA REPAIR      HX RENAL TRANSPLANT      1/20 pt has fu Feb 5th 12/19 Cr 1.69 GFR 45 6/19 Cr 1.41 GFR 56 3/19 Cr 1.67 GFR 46 11/18 seen by Dr. Clyde Canterbury, renal fx worsening, no comment on cause 11/18 Cr 1.65 GFR 478/18 Cr 1.95 GFR 38 6/18 Cr 1.5, Ca mildly high, SPE P/UPEPpending and asap f/u with nephrology 5/18 back to baseline cr 1.2 and GFR 68 CKD GFR 59 5/18 and was 59 11/17. Last Creat 1.36 increased from 1.2 11/17.    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 It seems that pt underwent  pancreas tp to "cure" DM/islet cells  a few years ago pt seen by endo and given OHG had pancreatitis, thought due to the DM medicines    SHOULDER SURGERY      SURGERY OF Porter      basal cell ca    Radiation   16   tx Dr  Ned Grace  FAMILY HISTORY: Reviewed and unchanged    SOCIAL HISTORY: Reviewed and unchanged    REVIEW OF SYSTEMS  Pertinent review of systems as discussed in interval oncological history       PHYSICAL EXAMINATION  Physical Exam  HENT:      Head: Normocephalic.      Nose: Nose normal.      Mouth/Throat:      Mouth: Mucous membranes are moist.   Neurological:      Mental Status: He is alert.            ASSESSMENT & PLAN  Martin Porter, Martin Porter is a pleasant 57 year old with basal cell carcinoma of the Porter post radiation treatments.  He is healing nicely but not completely healed.  We will have him return in 1 month for follow up in his instructed to call in the interim should he have problems or concerns.    Martin Barrows, MD  12/17/2022 13:37     [email or web address]

## 2022-12-18 ENCOUNTER — Other Ambulatory Visit (INDEPENDENT_AMBULATORY_CARE_PROVIDER_SITE_OTHER): Payer: Self-pay | Admitting: Internal Medicine

## 2022-12-22 ENCOUNTER — Ambulatory Visit: Payer: Medicare Other | Attending: Internal Medicine | Admitting: Internal Medicine

## 2022-12-22 ENCOUNTER — Other Ambulatory Visit: Payer: Self-pay

## 2022-12-22 ENCOUNTER — Encounter (INDEPENDENT_AMBULATORY_CARE_PROVIDER_SITE_OTHER): Payer: Self-pay | Admitting: Internal Medicine

## 2022-12-22 VITALS — BP 140/82 | HR 88 | Temp 98.0°F | Ht 73.0 in | Wt 319.8 lb

## 2022-12-22 DIAGNOSIS — E782 Mixed hyperlipidemia: Secondary | ICD-10-CM | POA: Insufficient documentation

## 2022-12-22 DIAGNOSIS — N1831 Chronic kidney disease, stage 3a: Secondary | ICD-10-CM | POA: Insufficient documentation

## 2022-12-22 DIAGNOSIS — I480 Paroxysmal atrial fibrillation: Secondary | ICD-10-CM | POA: Insufficient documentation

## 2022-12-22 DIAGNOSIS — Z794 Long term (current) use of insulin: Secondary | ICD-10-CM | POA: Insufficient documentation

## 2022-12-22 DIAGNOSIS — E119 Type 2 diabetes mellitus without complications: Secondary | ICD-10-CM | POA: Insufficient documentation

## 2022-12-22 MED ORDER — TRAZODONE 50 MG TABLET
50.0000 mg | ORAL_TABLET | Freq: Every evening | ORAL | 2 refills | Status: AC
Start: 2022-12-22 — End: ?

## 2022-12-22 NOTE — Progress Notes (Signed)
Name: KAIMI PFISTERER                       Date of Birth: 1966/05/01   MRN:  [id number]                         Date of visit: 12/22/2022     PCP: Samuella Cota, PA-C     Subjective  Martin Porter is a 57 y.o. year old male who presents for Follow Up 4 Months (No problems)   to clinic.  No specialty comments available.   Patient Active Problem List    Diagnosis Date Noted    History of pancreas transplant (CMS HCC) 05/09/2022    Immunosuppression (CMS HCC) 05/09/2022    Class 3 severe obesity due to excess calories with serious comorbidity and body mass index (BMI) of 45.0 to 49.9 in adult (CMS [facility]) 05/09/2022    Stable proliferative diabetic retinopathy of both eyes associated with type 2 diabetes mellitus (CMS HCC) 05/09/2022    Cellulitis 09/05/2021    Hypomagnesemia 07/15/2021    Testicular hypofunction 07/15/2021    Type 2 diabetes mellitus without complications (CMS HCC) 07/15/2021    CKD (chronic kidney disease) stage 3, GFR 30-59 ml/min (CMS HCC) 07/15/2021    Anemia, unspecified 07/15/2021    Mixed hyperlipidemia 07/15/2021    Atrial fibrillation (CMS HCC) 07/15/2021    Diabetic peripheral neuropathy (CMS HCC) 07/15/2021    GERD (gastroesophageal reflux disease) 07/15/2021    Renal transplant recipient 07/15/2021    Impotence 07/15/2021    Hypogonadism in male 07/15/2021    Obstructive sleep apnea syndrome 07/15/2021     4/18 compliant with CPAP, using nasal PILLOW MILD OSA. 12/17 MILD, WILL FOLLOW UP WITH SLEEP MED FOR CPAP MAY  BE BECAUSE FATIGUE      Candida infection 07/15/2021      Current Outpatient Medications   Medication Sig    acetaminophen (TYLENOL) 500 mg Oral Tablet Take 1 Tablet (500 mg total) by mouth Every 4 hours as needed for Pain    amLODIPine (NORVASC) 5 mg Oral Tablet Take 1 Tablet (5 mg total) by mouth Twice daily    aspirin (ECOTRIN) 81 mg Oral Tablet, Delayed Release (E.C.) Take 1 Tablet  (81 mg total) by mouth    atenoloL (TENORMIN) 50 mg Oral Tablet Take 1 Tablet (50 mg total) by mouth Twice daily    atropine (ISOPTO ATROPINE) 1 % Ophthalmic Drops 1 Drop Four times a day    carvediloL (COREG) 25 mg Oral Tablet Take 1 Tablet (25 mg total) by mouth Twice daily with food    cholecalciferol, vitamin D3, 1,250 mcg (50,000 unit) Oral Capsule Take 1 Capsule (50,000 Units total) by mouth Every 7 days    cloNIDine HCL (CATAPRES) 0.1 mg Oral Tablet TAKE ONE TABLET BY MOUTH TWICE A DAY    cyanocobalamin (VITAMIN B 12) 1,000 mcg Oral Tablet Take 2 Tablets (2,000 mcg total) by mouth    docusate sodium (COLACE) 100 mg Oral Capsule Take 1 Capsule (100 mg total) by mouth    dorzolamide-timoloL (COSOPT) 22.3-6.8 mg/mL Ophthalmic Drops Administer 1 Drop into the left eye Twice daily    ergocalciferol, vitamin D2, (DRISDOL) 1,250 mcg (50,000 unit) Oral Capsule Take 1 Capsule (50,000 Units total) by mouth Every 7 days    famotidine (PEPCID) 40 mg Oral Tablet Take 1 Tablet (40 mg total) by mouth Twice daily    fenofibrate (  LOFIBRA) 160 mg Oral Tablet Take 1 Tablet (160 mg total) by mouth Once a day    furosemide (LASIX) 20 mg Oral Tablet TAKE ONE TABLET BY MOUTH EVERY DAY    gabapentin (NEURONTIN) 800 mg Oral Tablet TAKE ONE TABLET BY MOUTH THREE TIMES A DAY    hydroCHLOROthiazide (HYDRODIURIL) 25 mg Oral Tablet Take 1 Tablet (25 mg total) by mouth Once a day    insulin glargine (BASAGLAR KWIKPEN U-100 INSULIN) 100 unit/mL Subcutaneous Insulin Pen INJECT 95 UNITS UNDER THE SKIN AT BEDTIME    iron ps complex-B12-folic acid (POLY-IRON 150 FORTE) 150-25-1 mg-mcg-mg Oral Capsule Take 2 Capsules by mouth Once a day    magnesium oxide (MAG-OX) 400 mg Oral Tablet Take 1 Tablet (400 mg total) by mouth Three times a day    multivitamin with iron Oral Tablet Take 1 Tablet by mouth Once a day    mycophenolate sodium (MYFORTIC) 180 mg Oral Tablet, Delayed Release (E.C.) Take 3 Tablets (540 mg total) by mouth Twice daily    NOVOLOG  FLEXPEN U-100 INSULIN 100 unit/mL (3 mL) Subcutaneous Insulin Pen INJECT 80 UNITS ONCE A DAY FOR 90 DAYS (Patient taking differently: Inject 100 Units under the skin Once a day)    nystatin (MYCOSTATIN) 100,000 unit/gram Cream Apply topically Twice daily    pantoprazole (PROTONIX) 40 mg Oral Tablet, Delayed Release (E.C.) Take 1 Tablet (40 mg total) by mouth Twice daily    Pen Needle, Disposable, (BD UF MINI PEN NEEDLE) 31 gauge x 3/16" Needle USE 5 TIMES A DAY    potassium citrate (UROCIT-K) 10 mEq (1,080 mg) Oral Tablet Sustained Release Take 1 Tablet (10 mEq total) by mouth Twice daily with food    pravastatin (PRAVACHOL) 20 mg Oral Tablet Take 1 Tablet (20 mg total) by mouth Every evening    prednisoLONE acetate (PRED FORTE) 1 % Ophthalmic Drops, Suspension Instill 1 Drop into both eyes Twice daily    SSD 1 % Cream APPLY AS DIRECTED ONCE A DAY TO HEEL AND ANKLE    tacrolimus (PROGRAF) 1 mg Oral Capsule TAKE THREE CAPSULES BY MOUTH TWICE A DAY FOR KIDNEY TRANSPLANT    testosterone (ANDROGEL) 20.25 mg/1.25 gram (1.62 %) Transdermal Gel in Metered-dose Pump Place 20.25 mg on the skin Once a day    traZODone (DESYREL) 50 mg Oral Tablet Take 1 Tablet (50 mg total) by mouth Every night    trimethoprim-sulfamethoxazole (BACTRIM) 80-400mg  per tablet TAKE 1 TABLET 3 TIMES A WEEK    vitamin E 400 unit Oral Capsule Take 1 Capsule (400 Units total) by mouth Once a day        Chronic Disease Management-AMB  Fu visit    This pt  is for  fu today  of chronic problems     FU basal cell back  and   upper lip  doing radiation   16 total  treatments        Better but still with dehydration     Co insomnia  pt  says  he can't sleep well  pt  says nothing is bothering him   he takes Neurontin at  8 pm  we discussed  moving the time   Will do trial of  trazodone     fu ckd  3  sees dr Clyde Canterbury  fu  1 year fu    hx of  transplant  dec   2005     and  [place]    Appts  utd         needs fu with  dr Clyde Canterbury      abdominal  pain  9/18 EGD Dr. Cheri Kearns, antral ulcer 2016 nl HIDA 2017 nl abd ultrasound x for FLD    Anemia   fu  labs   h/h stable      atrial fibrillation  pt reports that took coumadin after transplant and recalls 2 yrs of heparin before hand when on dialysis. Pt thinks took coumadin for 9-12 mo after transplant but pt does not recal  Nothing  done  since        cyst of kidney  1/20 pt contacted transplat team, they are not concerned about the 8 cm cyst at the time, pt scheduled for fu for Feb 5th 1/20 8 cm seen on transplanted kidney, sending to transplant team, need to verify that this is not worrisome    diabetes mellitus type 2  Pt has been in  [place] control but  Too much control  Pt's Aic 5.9%  Pt and I discussed how detrimental low BS can be   Insulin needs to be adjusted   Pt brings up medication oral agents he has been on in the past and he would like to be off insulin and oral agents only   However as he has had a renal transplant and can not take medications he was on in the past       No recent low  BS      diabetic peripheral neuropathy    essential hypertension  bp mild increase but at radiation  it is high   will add  clonidine   0.1   bid  and monitor        gastroesophageal reflux disease currently  stable     glaucoma  2020 pt has fu at [facility] severe Right, followed at [facility], last visit 5/17 sees every 11 mo.    Hx of his Herpes simplex    history of renal transplant left   dec   2005  [facility]    1/20 pt has fu Feb 5th 12/19 Cr 1.69 GFR 45 6/19 Cr 1.41 GFR 56 3/19 Cr 1.67 GFR 46 11/18 seen by Dr. Clyde Canterbury, renal fx worsening, no comment on cause 11/18 Cr 1.65 GFR 47    history of transplantation of pancreas  12/19 [place] nl 8.4 amylase and lipse nl PK txp 2005, pt reports had renal failure and was on HD, qualified for ki tp. 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    hyperlipidemia  Stable on fenofibrate and statin     legal blindness    obesity  12/18 pt  would like referral for bariatric surgery, would like to be seen by [facility] because of his hx of transplant    obstructive sleep apnea syndrome  4/18 compliant with CPAP, using nasal pillow, MILD OSA 12/17 mild, will f/u with sleep med for CPAP, may be cause of fatigue (rather than low T)    referred to dr Truddie Crumble   and following  cpap    peptic ulcer  9/18 seen by gen surg/[facility], EGD and sono pending 11/17 pathology report from EGD and bx shows gastric ulcer, no neoplasm, H pylori negative 3/18 will need to decide on f/u EGD with Dr. Pecola Leisure hx of Hiatal hernia    polyp of colon  had cscope 5/17, bx showed melanosis coli only, no colitis  prostate specific antigen abnormal  3/19 PSA 1.5 PSA rise 2016 to 2017 on testosterone, followed by urology/[place], last seen 3/18    testicular hypofunction  5/18 followed by urology, they will follow PSA and testosterone levels prescribed gel 5/18 PSA 1.4 (slightly lower than last) followed by urology 5/18 PSA velocity increase  Pt needs  refill on medication   lab  needed     Vitamin D deficiency  On supplement              REVIEW OF SYSTEMS:   Review of Systems  General: No fever.  No chills.  No weight changes.  HEENT: No vision changes.  Cardiac: No chest pain. No palpitations.  No dizziness.  No light-headedness.  No near syncope.  Resp: No dyspnea at rest, no dyspnea on exertion; no cough or hemoptysis; no orthopnea or PND.  GI: No N/V. No melena.  No bright red blood per rectum.  Ext: No edema.  No claudication.  Neuro: No focal weakness.  No numbness.  All other ROS negative.      Objective:   BP (!) 140/82 (Site: Left Arm, Patient Position: Sitting)   Pulse 88   Temp 36.7 C (98 F)   Ht 1.854 m (6\' 1" )   Wt (!) 145 kg (319 lb 12.8 oz)   SpO2 97%   BMI 42.19 kg/m              PHYSICAL EXAM  Physical Exam  Gen: NAD. Alert.   HEENT: PERRL; conjunctivae clear. No JVD or carotid bruit.  Cardiac: RRR with normal S1, S2.   Lungs: Clear to auscultation bilaterally. No rales.  No wheezing. No rhonchi.  Abdomen: Soft, non-tender.non-distended  nl bowel sounds    Extremities: No edema. No cyanosis. No clubbing.  Neurologic:  Grossly intact    Lab Results   Component Value Date    CHOLESTEROL 180 10/20/2022    HDLCHOL 42 10/20/2022    LDLCHOL 102 (H) 10/20/2022    TRIG 181 (H) 10/20/2022       COMPLETE BLOOD COUNT   Lab Results   Component Value Date    WBC 4.9 10/22/2022    HGB 10.2 (L) 10/22/2022    HCT 30.5 (L) 10/22/2022    PLTCNT 258 10/22/2022    BANDS 3 (L) 09/09/2021       DIFFERENTIAL  Lab Results   Component Value Date    PMNS 57 10/22/2022    LYMPHOCYTES 29 10/22/2022    MYELOCYTES 3 09/08/2021    MONOCYTES 9 10/22/2022    EOSINOPHIL 5 10/22/2022    BASOPHILS 0 10/22/2022    BASOPHILS 0.01 10/22/2022    PMNABS 2.79 10/22/2022    LYMPHSABS 1.39 10/22/2022    EOSABS 0.25 10/22/2022    MONOSABS 0.43 10/22/2022        COMPREHENSIVE METABOLIC PANEL   Lab Results   Component Value Date    SODIUM 140 10/27/2022    POTASSIUM 4.2 10/27/2022    CHLORIDE 105 10/27/2022    CO2 28 10/27/2022    ANIONGAP 7 10/27/2022    BUN 34 (H) 10/27/2022    CREATININE 2.51 (H) 10/27/2022    GLUCOSENF 149 (H) 10/27/2022    CALCIUM 9.6 10/27/2022    PHOSPHORUS 3.3 (L) 10/27/2022    ALBUMIN 3.9 10/27/2022    TOTALPROTEIN 6.8 10/22/2022    ALKPHOS 44 (L) 10/22/2022    AST 69 (H) 10/22/2022    ALT 42 10/22/2022  THYROID STIMULATING HORMONE  Lab Results   Component Value Date    TSH 2.030 10/20/2022        Lab Results   Component Value Date    HA1C 5.9 10/20/2022     Lab Results   Component Value Date    VITD 46 05/09/2022         Assessment/Plan  Problem List Items Addressed This Visit       Type 2 diabetes mellitus without complications (CMS HCC)    Mixed hyperlipidemia - Primary    Hypomagnesemia    CKD (chronic kidney disease) stage 3, GFR 30-59 ml/min (CMS HCC)    Atrial fibrillation (CMS HCC)             Meds reviewed as well as labs.  Chart reviewed and updated.   Continue current  treatment.  Keep follow-up appointment.   Vaccine hx reviewed.   BS discussed    Trial of  trazodone  S/p retinal exam  July  2024  WF      Labs in  June  2024  and  aic  5.9%      Free  style Josephine Igo  out of range   42%    in  range   49%    in   7 days    61% time  in control in   90 days      67 events  of  low   BS  in  90 days  discussed      He feels them all and he  knows what to do     Basaglar    35 am and    45 pm    Novolog  25 units am  and   35 now will drop      drop   to   20 units am  and   30 units pm           Due to  low BS      335 down  to    319  wt  and doing  well    continue diet  exercise   wt loss       I personally reviewed the documentation of care provided by the certified physician assistant and I agree with her medical decision making, Weyman Pedro, D.O.

## 2022-12-29 NOTE — Addendum Note (Signed)
Encounter addended by: Molli Barrows, MD on: 12/29/2022 11:43 AM   Actions taken: Level of Service modified

## 2023-01-01 ENCOUNTER — Other Ambulatory Visit (INDEPENDENT_AMBULATORY_CARE_PROVIDER_SITE_OTHER): Payer: Self-pay | Admitting: Internal Medicine

## 2023-01-02 ENCOUNTER — Other Ambulatory Visit (INDEPENDENT_AMBULATORY_CARE_PROVIDER_SITE_OTHER): Payer: Self-pay | Admitting: Internal Medicine

## 2023-01-13 ENCOUNTER — Encounter (INDEPENDENT_AMBULATORY_CARE_PROVIDER_SITE_OTHER): Payer: Self-pay | Admitting: Internal Medicine

## 2023-01-13 ENCOUNTER — Other Ambulatory Visit (INDEPENDENT_AMBULATORY_CARE_PROVIDER_SITE_OTHER): Payer: Self-pay | Admitting: Internal Medicine

## 2023-01-13 ENCOUNTER — Telehealth (INDEPENDENT_AMBULATORY_CARE_PROVIDER_SITE_OTHER): Payer: Self-pay | Admitting: Internal Medicine

## 2023-01-13 MED ORDER — CEFDINIR 300 MG CAPSULE
300.0000 mg | ORAL_CAPSULE | Freq: Two times a day (BID) | ORAL | 0 refills | Status: AC
Start: 2023-01-13 — End: 2023-01-23

## 2023-01-19 ENCOUNTER — Other Ambulatory Visit (INDEPENDENT_AMBULATORY_CARE_PROVIDER_SITE_OTHER): Payer: Self-pay | Admitting: Internal Medicine

## 2023-02-05 ENCOUNTER — Other Ambulatory Visit (INDEPENDENT_AMBULATORY_CARE_PROVIDER_SITE_OTHER): Payer: Self-pay | Admitting: Internal Medicine

## 2023-02-09 ENCOUNTER — Other Ambulatory Visit: Payer: Self-pay

## 2023-02-09 ENCOUNTER — Other Ambulatory Visit: Payer: Medicare Other | Attending: Internal Medicine

## 2023-02-09 DIAGNOSIS — Z940 Kidney transplant status: Secondary | ICD-10-CM | POA: Insufficient documentation

## 2023-02-09 LAB — COMPREHENSIVE METABOLIC PANEL, NON-FASTING
ALBUMIN/GLOBULIN RATIO: 1.3 (ref 0.8–1.4)
ALBUMIN: 3.7 g/dL (ref 3.5–5.7)
ALKALINE PHOSPHATASE: 33 U/L — ABNORMAL LOW (ref 34–104)
ALT (SGPT): 20 U/L (ref 7–52)
ANION GAP: 4 mmol/L (ref 4–13)
AST (SGOT): 18 U/L (ref 13–39)
BILIRUBIN TOTAL: 0.4 mg/dL (ref 0.3–1.0)
BUN/CREA RATIO: 19 (ref 6–22)
BUN: 62 mg/dL — ABNORMAL HIGH (ref 7–25)
CALCIUM, CORRECTED: 9.3 mg/dL (ref 8.9–10.8)
CALCIUM: 9.1 mg/dL (ref 8.6–10.3)
CHLORIDE: 103 mmol/L (ref 98–107)
CO2 TOTAL: 32 mmol/L — ABNORMAL HIGH (ref 21–31)
CREATININE: 3.35 mg/dL — ABNORMAL HIGH (ref 0.60–1.30)
ESTIMATED GFR: 20 mL/min/{1.73_m2} — ABNORMAL LOW (ref >59)
GLOBULIN: 2.9 (ref 2.9–5.4)
GLUCOSE: 158 mg/dL — ABNORMAL HIGH (ref 74–109)
OSMOLALITY, CALCULATED: 299 mosm/kg — ABNORMAL HIGH (ref 270–290)
POTASSIUM: 3.9 mmol/L (ref 3.5–5.1)
PROTEIN TOTAL: 6.6 g/dL (ref 6.4–8.9)
SODIUM: 139 mmol/L (ref 136–145)

## 2023-02-09 LAB — CBC
HCT: 28.3 % — ABNORMAL LOW (ref 36.7–47.1)
HGB: 9.7 g/dL — ABNORMAL LOW (ref 12.5–16.3)
MCH: 31.7 pg (ref 23.8–33.4)
MCHC: 34.2 g/dL (ref 32.5–36.3)
MCV: 92.5 fL (ref 73.0–96.2)
MPV: 10.6 fL (ref 7.4–11.4)
PLATELETS: 203 10*3/uL (ref 140–440)
RBC: 3.05 10*6/uL — ABNORMAL LOW (ref 4.06–5.63)
RDW: 13.2 % (ref 12.1–16.2)
WBC: 5.7 10*3/uL (ref 3.6–10.2)

## 2023-02-09 LAB — URINALYSIS, MICROSCOPIC
HYALINE CASTS: 4 /[LPF] — ABNORMAL HIGH (ref <0)
RBCS: <1 /[HPF] (ref <4)
SQUAMOUS EPITHELIAL: <1 /[HPF] (ref <28)
WBCS: <1 /[HPF] (ref <6)

## 2023-02-09 LAB — URINALYSIS, MACROSCOPIC
BILIRUBIN: NEGATIVE mg/dL
BLOOD: NEGATIVE mg/dL
GLUCOSE: 50 mg/dL — AB
KETONES: NEGATIVE mg/dL
LEUKOCYTES: NEGATIVE WBCs/uL
NITRITE: NEGATIVE
PH: 5.5 (ref 5.0–9.0)
PROTEIN: 30 mg/dL — AB
SPECIFIC GRAVITY: 1.011 (ref 1.002–1.030)
UROBILINOGEN: NORMAL mg/dL

## 2023-02-09 LAB — PHOSPHORUS: PHOSPHORUS: 3 mg/dL — ABNORMAL LOW (ref 3.7–7.2)

## 2023-02-09 LAB — MAGNESIUM: MAGNESIUM: 1.9 mg/dL (ref 1.9–2.7)

## 2023-02-09 LAB — MICROALBUMIN/CREATININE RATIO, URINE, RANDOM
CREATININE RANDOM URINE: 55 mg/dL (ref 30–125)
MICROALBUMIN RANDOM URINE: 28.7 mg/dL
MICROALBUMIN/CREATININE RATIO RANDOM URINE: 521.8 mg/g

## 2023-02-09 LAB — VITAMIN D 25 TOTAL: VITAMIN D 25, TOTAL: 73.64 ng/mL (ref 30.00–100.00)

## 2023-02-09 LAB — PARATHYROID HORMONE (PTH): PTH: 89.2 pg/mL — ABNORMAL HIGH (ref 12.0–88.0)

## 2023-02-10 ENCOUNTER — Ambulatory Visit: Payer: Medicare Other | Attending: Internal Medicine | Admitting: Internal Medicine

## 2023-02-10 ENCOUNTER — Encounter (INDEPENDENT_AMBULATORY_CARE_PROVIDER_SITE_OTHER): Payer: Self-pay | Admitting: Internal Medicine

## 2023-02-10 VITALS — BP 126/70 | HR 81 | Ht 73.0 in | Wt 321.6 lb

## 2023-02-10 DIAGNOSIS — Z0000 Encounter for general adult medical examination without abnormal findings: Secondary | ICD-10-CM | POA: Insufficient documentation

## 2023-02-10 DIAGNOSIS — Z23 Encounter for immunization: Secondary | ICD-10-CM | POA: Insufficient documentation

## 2023-02-10 NOTE — Progress Notes (Signed)
INTERNAL MEDICINE, BUILDING A  [address]  [place] [address]  Operated by [facility]  Medicare Annual Wellness Visit    Name: Martin Porter MRN:  [id number]   Date: 02/10/2023 Age: 57 y.o.       SUBJECTIVE:   Martin Porter is a 57 y.o. male for presenting for Medicare Wellness exam.   I have reviewed and reconciled the medication list with the patient today.        02/10/2023    11:30 AM 01/29/2022     2:35 PM   Comprehensive Health Assessment-Adult   Do you wish to complete this form? Yes Yes   During the past 4 weeks, how would you rate your health in general? Good Very Good   During the past 4 weeks, how much difficulty have you had doing your usual activities inside and outside your home because of medical or emotional problems? Some difficulty No difficulty at all   During the past 4 weeks, was someone available to help you if you needed and wanted help? Yes, some Yes, as much as I wanted   In the past year, how many times have you gone to the emergency department or been admitted to a hospital for a health problem? None 1 time   Are you generally satisfied with your sleep? No Yes   Do you have enough money to buy things you need in everyday life, such as food, clothing, medicines, and housing? Sometimes Yes, always   Can you get to places beyond walking distance without help?  (For example, can you drive your own car or travel alone on buses)? Yes Yes   Do you fasten your seatbelt when you are in a car? Yes, usually Yes, usually   Do you exercise 20 minutes 3 or more days per week (such as walking, dancing, biking, mowing grass, swimming)? Yes, some of the time Yes, some of the time   How often do you eat food that is healthy (fruits, vegetables, lean meats) instead of unhealthy (sweets, fast food, junk food, fatty foods)? Some of the time Most of the time   Have your parents, brothers or sisters had any of the following problems before the age of 68? (check all that apply)   Heart problems, or hardening of the arteries;Diabetes (sugar);Alcohol or drug addiction (or abuse);Cancer;High cholesterol   How often do you have trouble taking medicines the eay you are told to take them? I always take them as prescribed I always take them as prescribed   Do you need any help communicating with your doctors and nurses because of vision or hearing problems? No No   During the past 12 months, have you experienced confusion or memory loss that is happening more often or is getting worse? No No   Do you have one person you think of as your personal doctor (primary care provider or family doctor)? Yes Yes   If you are seeing a Primary Care Provider (PCP) or family doctor. please list their name [facility]   Are you now also seeing any specialist physician(s) (such as eye doctor, foot doctor, skin doctor)? Yes    If you are seeing a specialist for anything such as foot, eye, skin, etc.  please list their name(s) dr Denver Faster    dr Dollene Cleveland     dr Thalia Party    How confident are you that you can control or manage most of your health problems? Very confident Very  confident       I have reviewed and updated as appropriate the past medical, family and social history. 02/10/2023 as summarized below:  Past Medical History:   Diagnosis Date    Abnormal prostate specific antigen     Anemia, unspecified     Atrial fibrillation (CMS HCC)     Candida infection     CKD (chronic kidney disease) stage 3, GFR 30-59 ml/min (CMS HCC)     Cyst of kidney, acquired     Diabetic peripheral neuropathy (CMS HCC)     GERD (gastroesophageal reflux disease)     Herpes simplex     Hiatal hernia     Hypogonadism in male     Hypomagnesemia     Impotence     Legal blindness     Mixed hyperlipidemia     Obesity, unspecified     12/18 pt would like referral for bariatric surgery, would like to be seen by [facility] because of his hx of transplant    Obstructive sleep apnea syndrome     4/18 compliant with CPAP, using nasal PILLOW  MILD OSA. 12/17 MILD, WILL FOLLOW UP WITH SLEEP MED FOR CPAP MAY  BE BECAUSE FATIGUE    Peptic ulcer     9/18 seen by gen surg/Reese, EGD and sono pending gastric ulcer, no neoplasm, H pylori negative 3/18 will need to decide on f/u EGD with Dr. Despina Arias    Polyp of colon     Renal transplant recipient     Testicular hypofunction     5/18 followed by urology, they will follow PSA and testosterone levels prescribed gel 5/18 PSA 1.4 (slightly lower than last) followed by urology 5/18 PSA velocity increased form 2016-2017, testosterone held, was to have repeat PSA/VitD and T levels in our clinic and CC to Dr. Ricki Miller Right testicular hypotrophy    Tobacco dependence syndrome     Type 2 diabetes mellitus without complications (CMS HCC)     Unspecified glaucoma(365.9)     Vitamin D deficiency      Past Surgical History:   Procedure Laterality Date    Colonoscopy      Debridement  foot Right     Eye surgery      Foot surgery      Hx hernia repair      Hx renal transplant      Hx upper endoscopy      Kidney surgery      Other surgical history      Shoulder surgery      Surgery of lip       Current Outpatient Medications   Medication Sig    acetaminophen (TYLENOL) 500 mg Oral Tablet Take 1 Tablet (500 mg total) by mouth Every 4 hours as needed for Pain    amLODIPine (NORVASC) 5 mg Oral Tablet Take 1 Tablet (5 mg total) by mouth Twice daily    aspirin (ECOTRIN) 81 mg Oral Tablet, Delayed Release (E.C.) Take 1 Tablet (81 mg total) by mouth    atenoloL (TENORMIN) 50 mg Oral Tablet TAKE ONE TABLET BY MOUTH TWICE A DAY    atropine (ISOPTO ATROPINE) 1 % Ophthalmic Drops 1 Drop Four times a day    carvediloL (COREG) 25 mg Oral Tablet TAKE ONE TABLET BY MOUTH TWICE A DAY WITH FOOD    cholecalciferol, vitamin D3, 1,250 mcg (50,000 unit) Oral Capsule Take 1 Capsule (50,000 Units total) by mouth Every 7 days    cloNIDine HCL (CATAPRES) 0.1 mg  Oral Tablet TAKE ONE TABLET BY MOUTH TWICE A DAY    CREON 36,000-114,000- 180,000 unit Oral  Capsule, Delayed Release(E.C.) TAKE TWO CAPSULES BY MOUTH THREE TIMES A DAY WITH MEALS    cyanocobalamin (VITAMIN B 12) 1,000 mcg Oral Tablet Take 2 Tablets (2,000 mcg total) by mouth    docusate sodium (COLACE) 100 mg Oral Capsule Take 1 Capsule (100 mg total) by mouth    dorzolamide-timoloL (COSOPT) 22.3-6.8 mg/mL Ophthalmic Drops Administer 1 Drop into the left eye Twice daily    ergocalciferol, vitamin D2, (DRISDOL) 1,250 mcg (50,000 unit) Oral Capsule Take 1 Capsule (50,000 Units total) by mouth Every 7 days    famotidine (PEPCID) 40 mg Oral Tablet Take 1 Tablet (40 mg total) by mouth Twice daily    fenofibrate (LOFIBRA) 160 mg Oral Tablet Take 1 Tablet (160 mg total) by mouth Once a day    furosemide (LASIX) 20 mg Oral Tablet TAKE ONE TABLET BY MOUTH EVERY DAY    gabapentin (NEURONTIN) 800 mg Oral Tablet TAKE ONE TABLET BY MOUTH THREE TIMES A DAY    hydroCHLOROthiazide (HYDRODIURIL) 25 mg Oral Tablet Take 1 Tablet (25 mg total) by mouth Once a day    insulin glargine (BASAGLAR KWIKPEN U-100 INSULIN) 100 unit/mL Subcutaneous Insulin Pen INJECT 95 UNITS UNDER THE SKIN AT BEDTIME    iron ps complex-B12-folic acid (POLY-IRON 150 FORTE) 150-25-1 mg-mcg-mg Oral Capsule Take 2 Capsules by mouth Once a day    magnesium oxide (MAG-OX) 400 mg Oral Tablet TAKE ONE TABLET BY MOUTH THREE TIMES A DAY    multivitamin with iron Oral Tablet Take 1 Tablet by mouth Once a day    mycophenolate sodium (MYFORTIC) 180 mg Oral Tablet, Delayed Release (E.C.) Take 3 Tablets (540 mg total) by mouth Twice daily    NOVOLOG FLEXPEN U-100 INSULIN 100 unit/mL (3 mL) Subcutaneous Insulin Pen INJECT 80 UNITS ONCE A DAY FOR 90 DAYS (Patient taking differently: Inject 100 Units under the skin Once a day)    nystatin (MYCOSTATIN) 100,000 unit/gram Cream Apply topically Twice daily    pantoprazole (PROTONIX) 40 mg Oral Tablet, Delayed Release (E.C.) Take 1 Tablet (40 mg total) by mouth Twice daily    Pen Needle, Disposable, (BD UF MINI PEN NEEDLE)  31 gauge x 3/16" Needle USE 5 TIMES A DAY    potassium citrate (UROCIT-K) 10 mEq (1,080 mg) Oral Tablet Sustained Release Take 1 Tablet (10 mEq total) by mouth Twice daily with food    pravastatin (PRAVACHOL) 20 mg Oral Tablet TAKE ONE TABLET BY MOUTH EVERY EVENING    prednisoLONE acetate (PRED FORTE) 1 % Ophthalmic Drops, Suspension Instill 1 Drop into both eyes Twice daily    SSD 1 % Cream APPLY AS DIRECTED ONCE A DAY TO HEEL AND ANKLE    tacrolimus (PROGRAF) 1 mg Oral Capsule TAKE THREE CAPSULES BY MOUTH TWICE A DAY FOR KIDNEY TRANSPLANT    testosterone (ANDROGEL) 20.25 mg/1.25 gram (1.62 %) Transdermal Gel in Metered-dose Pump Place 20.25 mg on the skin Once a day    traZODone (DESYREL) 50 mg Oral Tablet Take 1 Tablet (50 mg total) by mouth Every night    trimethoprim-sulfamethoxazole (BACTRIM) 80-400mg  per tablet TAKE 1 TABLET 3 TIMES A WEEK    vitamin E 400 unit Oral Capsule Take 1 Capsule (400 Units total) by mouth Once a day     Family Medical History:       Problem Relation (Age of Onset)    COPD Father  Diabetes type II Mother, Father    Other Maternal Grandfather            Social History     Socioeconomic History    Marital status: Single   Tobacco Use    Smoking status: Never    Smokeless tobacco: Current     Types: Snuff   Vaping Use    Vaping status: Never Used   Substance and Sexual Activity    Alcohol use: Yes     Comment: rarely    Drug use: Never    Sexual activity: Not Currently     Social Determinants of Health     Financial Resource Strain: Low Risk  (11/04/2021)    Financial Resource Strain     SDOH Financial: No   Transportation Needs: High Risk (11/04/2021)    Transportation Needs     SDOH Transportation: Yes, it has kept me from medical appointments or from getting my medications   Social Connections: Medium Risk (11/04/2021)    Social Connections     SDOH Social Isolation: 3 to 5 times a week   Intimate Partner Violence: Low Risk  (11/04/2021)    Intimate Partner Violence     SDOH Domestic  Violence: No   Housing Stability: Low Risk  (11/04/2021)    Housing Stability     SDOH Housing Situation: I have housing.     SDOH Housing Worry: No   Health Literacy: Medium Risk (12/22/2022)    Health Literacy     SDOH Health Literacy: Occasionally   Employment Status: High Risk (11/04/2021)    Employment Status     SDOH Employment: Unemployed         List of Current Health Care Providers   Care Team       PCP       Name Type Specialty Phone Number    Twin Brooks, Ranae Plumber, [place] Physician Assistant PHYSICIAN ASSISTANT [phone]              Care Team       No care team found                      Health Maintenance   Topic Date Due    Diabetic Retinal Exam  Never done    Pneumococcal Vaccine, Age 74-64 (1 of 2 - PCV) 08/18/1970    Colonoscopy  Never done    Shingles Vaccine (2 of 2) 10/08/2022    Influenza Vaccine (1) 01/04/2023    Covid-19 Vaccine (6 - 2023-24 season) 01/04/2023    Medicare Annual Wellness Visit  01/30/2023    Diabetic A1C  04/23/2023    Diabetic Kidney Health eGFR  02/09/2024    Diabetic Kidney Health Microalb/Cr Ratio  02/09/2024    Depression Screening  02/10/2024    Prostate Cancer Screening  08/27/2024    Adult Tdap-Td (2 - Td or Tdap) 06/04/2026    Osteoporosis screening  09/04/2032    Hepatitis B Vaccine  Discontinued    Hepatitis C screening  Discontinued    HIV Screening  Discontinued     Medicare Wellness Assessment   Medicare initial or wellness physical in the last year?: Yes  Advance Directives   Does patient have a living will or MPOA: No           Advance directive information given to the patient today?: Patient Declined      Activities of Daily Living   Do you need help with dressing, bathing,  or walking?: No   Do you need help with shopping, housekeeping, medications, or finances?: No   Do you have rugs in hallways, broken steps, or poor lighting?: No   Do you have grab bars in your bathroom, non-slip strips in your tub, and hand rails on your stairs?: Yes   Cognitive Function Screen  (1=Yes, 0=No)   What is you age?: Correct   What is the time to the nearest hour?: Correct   What is the year?: Correct   What is the name of this clinic?: Correct   Can the patient recognize two persons (the doctor, the nurse, home help, etc.)?: Correct   What is the date of your birth? (day and month sufficient) : Correct   In what year did World War II end?: Correct   Who is the current president of the [place]?: Correct   Count from 20 down to 1?: Correct   What address did I give you earlier?: Correct   Total Score: 10       Fall Risk Screen   Do you feel unsteady when standing or walking?: No  Do you worry about falling?: No  Have you fallen in the past year?: No   Depression Screen     Little interest or pleasure in doing things.: Not at all  Feeling down, depressed, or hopeless: Not at all  PHQ 2 Total: 0     Pain Score   Pain Score:   0 - No pain    Substance Use-Abuse Screening     Tobacco Use     In Past 12 MONTHS, how often have you used any tobacco product (for example, cigarettes, e-cigarettes, cigars, pipes, or smokeless tobacco)?: Never     Alcohol use     In the PAST 12 MONTHS, how often have you had 5 (men)/4 (women) or more drinks containing alcohol in one day?: Never     Prescription Drug Use     In the PAST 12 months, how often have you used any prescription medications just for the feeling, more than prescribed, or that were not prescribed for you? Prescriptions may include: opioids, benzodiazepines, medications for ADHD: Never           Illicit Drug Use   In the PAST 12 MONTHS, how often have you used any drugs, including marijuana, cocaine or crack, heroin, methamphetamine, hallucinogens, ecstasy/MDMA?: Never               OBJECTIVE:   BP 126/70 (Site: Left Arm, Patient Position: Sitting)   Pulse 81   Ht 1.854 m (6\' 1" )   Wt (!) 146 kg (321 lb 9.6 oz)   SpO2 98%   BMI 42.43 kg/m        Other appropriate exam:  Exam-AMB  Const  General: cooperative, healthy appearing and no acute  distress  Orientation/Consciousness: patient oriented x3  HENMT  Ears: hearing grossly normal bilaterally  Eyes  General: appearance normal, both eyes and all related structures  Conjunctivae: conjunctivae normal  Sclera: sclerae normal  EOM: EOM intact bilaterally  Neck  Neck: normal visual inspection and no lymphadenopathy  Thyroid: thyroid normal  Carotids: no bruits  Resp  Effort & Inspection: normal respiratory effort  Auscultation: clear to auscultation bilaterally, no crackles, no rales, no rhonchi and no wheezes  Cardio  Jugular venous pressure: no JVD  Rate: regular rate  Rhythm: regular rhythm  Heart Sounds: S1 normal, S2 normal, no click, no murmurs and no rubs  Bruits: no carotid bruits  GI  Inspection: Yes normal to inspection  Palpation: soft, no hepatosplenomegaly, no guarding, no masses and nontender  Auscultation: normal bowel sounds  Neuro  General: patient oriented x3  Extrem  General: normal to inspection, normal exam except as noted, clubbing, cyanosis or edema noted, normal gait and other  Psych  Mental Status: mental status grossly normal  Mood: congruent mood  Affect: normal affect  Insight: insight good  Judgment: judgment good       Health Maintenance Due   Topic Date Due    Diabetic Retinal Exam  Never done    Pneumococcal Vaccine, Age 46-64 (1 of 2 - PCV) 08/18/1970    Colonoscopy  Never done    Shingles Vaccine (2 of 2) 10/08/2022    Influenza Vaccine (1) 01/04/2023    Covid-19 Vaccine (6 - 2023-24 season) 01/04/2023    Medicare Annual Wellness Visit  01/30/2023      ASSESSMENT & PLAN:   Assessment/Plan   1. Medicare annual wellness visit, subsequent       Identified Risk Factors/ Recommended Actions     The PHQ 2 Total: 0 depression screen is interpreted as negative.  Patient declined Advanced Directives information.    No orders of the defined types were placed in this encounter.         The patient has been educated about risk factors and recommended preventive care. Written Prevention  Plan completed/ updated and given to patient (see After Visit Summary).    Meds reviewed as well as labs.  Chart reviewed and updated.   Continue current treatment.  Keep follow-up appointment.   Vaccine hx reviewed.    Overall doing well   needs fu  with Dr Clyde Canterbury     Flu shot  today   Pt planning to get   covid vaccine         Samuella Cota, PA-C

## 2023-02-10 NOTE — Patient Instructions (Signed)
Medicare Preventive Services  Medicare coverage information Recommendation for YOU   Heart Disease and Diabetes   Lipid profile every 5 years or more often if at risk for cardiovascular disease  Lab Results   Component Value Date    CHOLESTEROL 180 10/20/2022    HDLCHOL 42 10/20/2022    LDLCHOL 102 (H) 10/20/2022    TRIG 181 (H) 10/20/2022       Diabetes Screening with Blood Glucose test or Glucose Tolerance Test Yearly for those at risk for diabetes, up to two tests per year for those with prediabetes  Last Glucose: 158     Diabetes Self-Management Training   Initial training ten hours per year, and follow-up training two hours per subsequent year. Optional for those with diabetes    Medical Nutrition Therapy   Three hours of one-on-one counseling in first year, two hours in subsequent years. Optional for those with diabetes, kidney disease   Intensive Behavioral Therapy for Obesity  Face-to-face counseling, first month every week, month 2-6 every other week, month 7-12 every month if continued progress is documented Optional for those with Body Mass Index 30 or higher  Your Body mass index is 42.43 kg/m.   Tobacco Cessation (Quitting) Counseling   Two attempts per year, max 4 sessions per attempt, up to 8 sessions per year Optional for those who use tobacco    Cancer Screening Last Completion Date   Colorectal screening   For anyone age 58 to 65 or any age if high risk:  Screening Colonoscopy every 10 yrs if low risk,  more frequent if higher risk  OR  Cologuard Stool DNA test once every 3 years OR  Fecal Occult Blood Testing yearly OR  Flexible  Sigmoidoscopy  every 5 yr OR  CT Colonography every 5 yrs    See below for due date if applicable.   Prostate Cancer Screening  Prostate Specific Antigen blood test based on joint decision making with your provider for ages 43-69  A joint decision between you and your primary care provider   Lung Cancer Screening  Annual low dose computed tomography (LDCT scan) is  recommended for those age 32-80 who smoked 20 pack-years and are current smokers or quit smoking within past 15 years, after counseling by your doctor or nurse clinician about the possible benefits or harms.   See below for due date if applicable.   Vaccinations   Respiratory syncytial virus (RSV)  Age 56 years or older: Based on shared clinical decision-making with your provider.  Pneumococcal Vaccine  Recommended routinely age 60+ with one or two separate vaccines based on your risk. Recommended before age 46 if medical conditions with increased risk  Seasonal Influenza Vaccine  Once every flu season   Hepatitis B Vaccine  3 doses if risk (including anyone with diabetes or liver disease)  Shingles Vaccine  Two doses at age 20 or older  Diphtheria Tetanus Pertussis Vaccine  ONCE as adult, booster every 10 years     Immunization History   Administered Date(s) Administered   . Covid-19 Vaccine,Pfizer Bivalent,56mcg/0.3ml,12 yrs+ 07/10/2021   . Covid-19 Vaccine,Pfizer-BioNTech,Gray Top,23yrs+ 07/04/2020   . Covid-19 Vaccine,Pfizer-BioNTech,Purple Top,17yrs+ 05/16/2019, 06/06/2019, 01/20/2020   . HEPATITIS A VACCINE -ADULT 07/21/2017, 11/04/2017   . Hep B, Unspecified Formulation 11/04/2017, 12/08/2017   . Influenza Vaccine, 6 month-adult 05/30/2021, 01/29/2022   . Pneumococcal, Unspecified Formulation 05/05/2012   . Tetanus Toxoid/Diphtheria Toxoid/Acellular Pertussis Vaccine, Adsorbed 06/04/2016     Shingles vaccine and Diphtheria Tetanus Pertussis  vaccines are available at pharmacies or local health department without a prescription.   Other Preventative Screening  Last Completion Date   Glaucoma Screening   Yearly if in high risk group such as diabetes, family history, African American age 47+ or Hispanic American age 77+ See your Eye Care Provider   Hepatitis C Screening   Recommended  for those born between ages 18-79 years. --08/28/2022  See below for due date if applicable.     HIV Testing  Recommended  routinely at least ONCE, covered every year for age 9 to 76 regardless of risk, and every year for age over 6 who ask for the test or higher risk. Yearly or up to 3 times in pregnancy    See below for due date if applicable.  Bone Densitometry   Screening is recommended for Men ages 35 and above with one or more risk factor: androgen deprivation therapy for prostate cancer, hypogonadism, frailty, primary hyperparathyroidism, hyperthyroidism  For men diagnosed with osteoporosis, follow up is recommended every years or a frequency recommended by your provider   --09/05/2022  See below for due date if applicable.   Abdominal Aortic Aneurysm Screening Ultrasound   Once with a family history of abdominal aortic aneurysms OR a male between65-75 and have smoked at least 100 cigarettes in your lifetime.     See below for due date if applicable.       Your Personalized Schedule for Preventive Tests   Health Maintenance: Pending and Last Completed       Date Due Completion Date    Diabetic Retinal Exam Never done ---    Pneumococcal Vaccine, Age 29-64 (1 of 2 - PCV) 08/18/1970 05/05/2012    Colonoscopy Never done ---    Shingles Vaccine (2 of 2) 10/08/2022 08/13/2022    Influenza Vaccine (1) 01/04/2023 01/29/2022    Covid-19 Vaccine (6 - 2023-24 season) 01/04/2023 07/10/2021    Medicare Annual Wellness Visit 01/30/2023 01/29/2022    Diabetic A1C 04/23/2023 10/22/2022    Diabetic Kidney Health eGFR 02/09/2024 02/09/2023    Diabetic Kidney Health Microalb/Cr Ratio 02/09/2024 02/09/2023    Depression Screening 02/10/2024 02/10/2023    Prostate Cancer Screening 08/27/2024 08/28/2022    Adult Tdap-Td (2 - Td or Tdap) 06/04/2026 06/04/2016    Osteoporosis screening 09/04/2032 09/05/2022              For Information on Advanced Directives for Health Care:  [place]:  [email or web address]  [place], [place], [place], [place] General Information: [email or web address]

## 2023-02-16 ENCOUNTER — Other Ambulatory Visit: Payer: Self-pay

## 2023-02-16 ENCOUNTER — Inpatient Hospital Stay
Admission: RE | Admit: 2023-02-16 | Discharge: 2023-02-16 | Disposition: A | Discharge status: Still In Hospital | Payer: Medicare Other | Source: Ambulatory Visit | Attending: RADIATION ONCOLOGY | Admitting: RADIATION ONCOLOGY

## 2023-02-16 DIAGNOSIS — C4401 Basal cell carcinoma of skin of lip: Secondary | ICD-10-CM | POA: Insufficient documentation

## 2023-02-16 DIAGNOSIS — Z923 Personal history of irradiation: Secondary | ICD-10-CM | POA: Insufficient documentation

## 2023-02-16 NOTE — Progress Notes (Signed)
Name: JAVONTA GRONAU MRN:  [id number]   Date: 02/16/2023 Age: 57 y.o.     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  He received radiation treatments and returns in follow up.  He reports he is doing well but is somewhat bothered by the loss of skin of his mustache.  His lip is not bothering him.    OTHER MEDICAL PROBLEMS:     There are no exam notes on file for this visit.     PAST MEDICAL HISTORY    Past Medical History:   Diagnosis Date    Abnormal prostate specific antigen     Anemia, unspecified     Atrial fibrillation (CMS HCC)     Candida infection     CKD (chronic kidney disease) stage 3, GFR 30-59 ml/min (CMS HCC)     Cyst of kidney, acquired     Diabetic peripheral neuropathy (CMS HCC)     GERD (gastroesophageal reflux disease)     Herpes simplex     Hiatal hernia     Hypogonadism in male     Hypomagnesemia     Impotence     Legal blindness     Mixed hyperlipidemia     Obesity, unspecified     12/18 pt would like referral for bariatric surgery, would like to be seen by [facility] because of his hx of transplant    Obstructive sleep apnea syndrome     4/18 compliant with CPAP, using nasal PILLOW MILD OSA. 12/17 MILD, WILL FOLLOW UP WITH SLEEP MED FOR CPAP MAY  BE BECAUSE FATIGUE    Peptic ulcer     9/18 seen by gen surg/Reese, EGD and sono pending gastric ulcer, no neoplasm, H pylori negative 3/18 will need to decide on f/u EGD with Dr. Despina Arias    Polyp of colon     Renal transplant recipient     Testicular hypofunction     5/18 followed by urology, they will follow PSA and testosterone levels prescribed gel 5/18 PSA 1.4 (slightly lower than last) followed by urology 5/18 PSA velocity increased form 2016-2017, testosterone held, was to have repeat PSA/VitD and T levels in our clinic and CC to Dr. Ricki Miller Right testicular hypotrophy    Tobacco dependence  syndrome     Type 2 diabetes mellitus without complications (CMS HCC)     Unspecified glaucoma(365.9)     Vitamin D deficiency         ALLERGIES:   No Known Allergies     MEDICATIONS:   Current Outpatient Medications   Medication Instructions    acetaminophen (TYLENOL) 500 mg, Oral, EVERY 4 HOURS PRN    amLODIPine (NORVASC) 5 mg, Oral, 2 TIMES DAILY    aspirin (ECOTRIN) 81 mg, Oral    atenoloL (TENORMIN) 50 mg, Oral, 2 TIMES DAILY    atropine (ISOPTO ATROPINE) 1 % Ophthalmic Drops 1 Drop, 4 TIMES DAILY    carvediloL (COREG) 25 mg, Oral, 2 TIMES DAILY, Take with food.    cholecalciferol (vitamin D3) 50,000 Units, Oral, EVERY 7 DAYS    cloNIDine HCL (CATAPRES) 0.1 mg, Oral, 2 TIMES DAILY    CREON 36,000-114,000- 180,000 unit Oral Capsule, Delayed Release(E.C.) TAKE TWO CAPSULES BY MOUTH THREE TIMES A DAY WITH MEALS    cyanocobalamin (VITAMIN B 12) 2,000 mcg, Oral  docusate sodium (COLACE) 100 mg, Oral    dorzolamide-timoloL (COSOPT) 22.3-6.8 mg/mL Ophthalmic Drops 1 Drop, Left Eye, 2 TIMES DAILY    ergocalciferol (vitamin D2) (DRISDOL) 50,000 Units, Oral, EVERY 7 DAYS    famotidine (PEPCID) 40 mg, Oral, 2 TIMES DAILY    fenofibrate (LOFIBRA) 160 mg, Oral, DAILY    furosemide (LASIX) 20 mg, Oral, DAILY    gabapentin (NEURONTIN) 800 mg Oral Tablet TAKE ONE TABLET BY MOUTH THREE TIMES A DAY    hydroCHLOROthiazide (HYDRODIURIL) 25 mg, Oral, DAILY    insulin glargine (BASAGLAR KWIKPEN U-100 INSULIN) 100 unit/mL Subcutaneous Insulin Pen INJECT 95 UNITS UNDER THE SKIN AT BEDTIME    iron ps complex-B12-folic acid (POLY-IRON 150 FORTE) 150-25-1 mg-mcg-mg Oral Capsule 2 Capsules, Oral, DAILY    magnesium oxide (MAG-OX) 400 mg, Oral, 3 TIMES DAILY    multivitamin with iron Oral Tablet 1 Tablet, Oral, DAILY    mycophenolate sodium (MYFORTIC) 540 mg, Oral, 2 TIMES DAILY    NOVOLOG FLEXPEN U-100 INSULIN 100 unit/mL (3 mL) Subcutaneous Insulin Pen INJECT 80 UNITS ONCE A DAY FOR 90 DAYS    nystatin (MYCOSTATIN) 100,000 unit/gram  Cream Apply Topically, 2 TIMES DAILY    pantoprazole (PROTONIX) 40 mg, Oral, 2 TIMES DAILY    Pen Needle, Disposable, (BD UF MINI PEN NEEDLE) 31 gauge x 3/16" Needle USE 5 TIMES A DAY    potassium citrate (UROCIT-K) 10 mEq (1,080 mg) Oral Tablet Sustained Release 10 mEq, Oral, 2 TIMES DAILY WITH FOOD    pravastatin (PRAVACHOL) 20 mg, Oral, NIGHTLY    prednisoLONE acetate (PRED FORTE) 1 % Ophthalmic Drops, Suspension 1 Drop, Both Eyes, 2 TIMES DAILY    SSD 1 % Cream APPLY AS DIRECTED ONCE A DAY TO HEEL AND ANKLE    tacrolimus (PROGRAF) 1 mg Oral Capsule TAKE THREE CAPSULES BY MOUTH TWICE A DAY FOR KIDNEY TRANSPLANT    testosterone (ANDROGEL) 20.25 mg, Transdermal, DAILY    traZODone (DESYREL) 50 mg, Oral, NIGHTLY    trimethoprim-sulfamethoxazole (BACTRIM) 80-400mg  per tablet TAKE 1 TABLET 3 TIMES A WEEK    vitamin E 400 Units, Oral, DAILY        INTERVAL SURGERIES:   Past Surgical History:   Procedure Laterality Date    COLONOSCOPY      DEBRIDEMENT  FOOT Right     EYE SURGERY      FOOT SURGERY      HX HERNIA REPAIR      HX RENAL TRANSPLANT      1/20 pt has fu Feb 5th 12/19 Cr 1.69 GFR 45 6/19 Cr 1.41 GFR 56 3/19 Cr 1.67 GFR 46 11/18 seen by Dr. Clyde Canterbury, renal fx worsening, no comment on cause 11/18 Cr 1.65 GFR 478/18 Cr 1.95 GFR 38 6/18 Cr 1.5, Ca mildly high, SPE P/UPEPpending and asap f/u with nephrology 5/18 back to baseline cr 1.2 and GFR 68 CKD GFR 59 5/18 and was 59 11/17. Last Creat 1.36 increased from 1.2 11/17.    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 It seems that pt underwent  pancreas tp to "cure" DM/islet cells  a few years ago pt seen by endo and given OHG had pancreatitis, thought due to the DM medicines  SHOULDER SURGERY      SURGERY OF LIP      basal cell ca    Radiation   16   tx Dr  Ned Grace         FAMILY HISTORY: Reviewed and unchanged    SOCIAL HISTORY: Reviewed and unchanged    REVIEW OF SYSTEMS  Pertinent review of systems as discussed in interval oncological history      PHYSICAL EXAMINATION  Physical Exam  HENT:      Head: Normocephalic.      Nose: Nose normal.      Mouth/Throat:      Mouth: Mucous membranes are moist.   Neurological:      Mental Status: He is alert.            ASSESSMENT & PLAN  Markian, Glockner is a pleasant 57 year old with basal cell carcinoma of the lip post radiation treatments.  He is healed.  We will have him return in 3 months for follow up in his instructed to call in the interim should he have problems or concerns.    Molli Barrows, MD  02/16/2023 10:32     ZO:[email or web address]@

## 2023-02-26 ENCOUNTER — Other Ambulatory Visit (INDEPENDENT_AMBULATORY_CARE_PROVIDER_SITE_OTHER): Payer: Self-pay | Admitting: Internal Medicine

## 2023-03-10 ENCOUNTER — Other Ambulatory Visit (INDEPENDENT_AMBULATORY_CARE_PROVIDER_SITE_OTHER): Payer: Self-pay | Admitting: Internal Medicine

## 2023-03-19 ENCOUNTER — Other Ambulatory Visit (INDEPENDENT_AMBULATORY_CARE_PROVIDER_SITE_OTHER): Payer: Self-pay | Admitting: Internal Medicine

## 2023-03-19 MED ORDER — PRAVASTATIN 20 MG TABLET
20.0000 mg | ORAL_TABLET | Freq: Every evening | ORAL | 1 refills | Status: DC
Start: 2023-03-19 — End: 2023-11-23

## 2023-04-09 ENCOUNTER — Other Ambulatory Visit (INDEPENDENT_AMBULATORY_CARE_PROVIDER_SITE_OTHER): Payer: Self-pay | Admitting: Internal Medicine

## 2023-04-20 ENCOUNTER — Other Ambulatory Visit (INDEPENDENT_AMBULATORY_CARE_PROVIDER_SITE_OTHER): Payer: Self-pay | Admitting: Internal Medicine

## 2023-05-04 ENCOUNTER — Telehealth (INDEPENDENT_AMBULATORY_CARE_PROVIDER_SITE_OTHER): Payer: Self-pay | Admitting: Internal Medicine

## 2023-05-04 NOTE — Telephone Encounter (Signed)
Called patient and he stated that he would call back he was in [employer] trying to check out, he was buying a new fridge.

## 2023-05-07 ENCOUNTER — Other Ambulatory Visit: Payer: Self-pay

## 2023-05-13 ENCOUNTER — Other Ambulatory Visit (INDEPENDENT_AMBULATORY_CARE_PROVIDER_SITE_OTHER): Payer: Self-pay | Admitting: Internal Medicine

## 2023-05-14 ENCOUNTER — Other Ambulatory Visit (INDEPENDENT_AMBULATORY_CARE_PROVIDER_SITE_OTHER): Payer: Self-pay | Admitting: Internal Medicine

## 2023-05-14 MED ORDER — PEN NEEDLE, DIABETIC 31 GAUGE X 3/16"
3 refills | Status: DC
Start: 2023-05-14 — End: 2023-12-07

## 2023-05-20 ENCOUNTER — Other Ambulatory Visit: Payer: Self-pay

## 2023-05-20 ENCOUNTER — Inpatient Hospital Stay
Admission: RE | Admit: 2023-05-20 | Discharge: 2023-05-20 | Disposition: A | Discharge status: Still In Hospital | Payer: Medicare Other | Source: Ambulatory Visit | Attending: RADIATION ONCOLOGY | Admitting: RADIATION ONCOLOGY

## 2023-05-20 DIAGNOSIS — Z08 Encounter for follow-up examination after completed treatment for malignant neoplasm: Secondary | ICD-10-CM | POA: Insufficient documentation

## 2023-05-20 DIAGNOSIS — Z923 Personal history of irradiation: Secondary | ICD-10-CM | POA: Insufficient documentation

## 2023-05-20 DIAGNOSIS — Z85818 Personal history of malignant neoplasm of other sites of lip, oral cavity, and pharynx: Secondary | ICD-10-CM | POA: Insufficient documentation

## 2023-05-20 DIAGNOSIS — C4401 Basal cell carcinoma of skin of lip: Secondary | ICD-10-CM

## 2023-05-20 NOTE — Progress Notes (Signed)
Name: Martin Porter MRN:  [id number]   Date: 05/20/2023 Age: 58 y.o.     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  He received radiation treatments in June of 2024 and returns in follow up.  He reports he is doing well but is somewhat bothered by the loss of skin of his mustache.  He has occasional sensitivity of the upper lip in the area of previously treated.  He denies other new difficulties.  We have spent some significant time discussing screening for cancers as well as risk for cancer after kidney transplant.  He will celebrate his 20th year of his transplant next year.    OTHER MEDICAL PROBLEMS:     There are no exam notes on file for this visit.     PAST MEDICAL HISTORY    Past Medical History:   Diagnosis Date    Abnormal prostate specific antigen     Anemia, unspecified     Atrial fibrillation (CMS HCC)     Candida infection     CKD (chronic kidney disease) stage 3, GFR 30-59 ml/min (CMS HCC)     Cyst of kidney, acquired     Diabetic peripheral neuropathy (CMS HCC)     GERD (gastroesophageal reflux disease)     Herpes simplex     Hiatal hernia     Hypogonadism in male     Hypomagnesemia     Impotence     Legal blindness     Mixed hyperlipidemia     Obesity, unspecified     12/18 pt would like referral for bariatric surgery, would like to be seen by [facility] because of his hx of transplant    Obstructive sleep apnea syndrome     4/18 compliant with CPAP, using nasal PILLOW MILD OSA. 12/17 MILD, WILL FOLLOW UP WITH SLEEP MED FOR CPAP MAY  BE BECAUSE FATIGUE    Peptic ulcer     9/18 seen by gen surg/Reese, EGD and sono pending gastric ulcer, no neoplasm, H pylori negative 3/18 will need to decide on f/u EGD with Dr. Despina Arias    Polyp of colon     Renal transplant recipient     Testicular hypofunction     5/18 followed by urology, they will follow PSA and testosterone levels prescribed gel  5/18 PSA 1.4 (slightly lower than last) followed by urology 5/18 PSA velocity increased form 2016-2017, testosterone held, was to have repeat PSA/VitD and T levels in our clinic and CC to Dr. Ricki Miller Right testicular hypotrophy    Tobacco dependence syndrome     Type 2 diabetes mellitus without complications (CMS HCC)     Unspecified glaucoma(365.9)     Vitamin D deficiency         ALLERGIES:   No Known Allergies     MEDICATIONS:   Current Outpatient Medications   Medication Instructions    acetaminophen (TYLENOL) 500 mg, Oral, EVERY 4 HOURS PRN    amLODIPine (NORVASC) 5 mg, Oral, 2 TIMES DAILY    aspirin (ECOTRIN) 81 mg, Oral    atenoloL (TENORMIN) 50 mg, Oral, 2 TIMES DAILY    atropine (ISOPTO ATROPINE) 1 % Ophthalmic Drops 1 Drop, 4 TIMES DAILY    carvediloL (COREG) 25 mg, Oral, 2 TIMES DAILY, Take with food.    cholecalciferol (vitamin D3) 50,000 Units, Oral, EVERY 7 DAYS  cloNIDine HCL (CATAPRES) 0.1 mg, Oral, 2 TIMES DAILY    CREON 36,000-114,000- 180,000 unit Oral Capsule, Delayed Release(E.C.) TAKE TWO CAPSULES BY MOUTH THREE TIMES A DAY WITH MEALS    cyanocobalamin (VITAMIN B 12) 2,000 mcg, Oral    docusate sodium (COLACE) 100 mg, Oral    dorzolamide-timoloL (COSOPT) 22.3-6.8 mg/mL Ophthalmic Drops 1 Drop, Left Eye, 2 TIMES DAILY    ergocalciferol (vitamin D2) (DRISDOL) 50,000 Units, Oral, EVERY 7 DAYS    famotidine (PEPCID) 40 mg, Oral, 2 TIMES DAILY    fenofibrate (LOFIBRA) 160 mg, Oral, DAILY    furosemide (LASIX) 20 mg, Oral, DAILY    gabapentin (NEURONTIN) 800 mg Oral Tablet TAKE ONE TABLET BY MOUTH THREE TIMES A DAY    hydroCHLOROthiazide (HYDRODIURIL) 25 mg, Oral, DAILY    insulin glargine (BASAGLAR KWIKPEN U-100 INSULIN) 100 unit/mL Subcutaneous Insulin Pen INJECT 95 UNITS UNDER THE SKIN AT BEDTIME    iron ps complex-B12-folic acid (POLY-IRON 150 FORTE) 150-25-1 mg-mcg-mg Oral Capsule 2 Capsules, Oral, DAILY    magnesium oxide (MAG-OX) 400 mg, Oral, 3 TIMES DAILY    multivitamin with iron Oral  Tablet 1 Tablet, Oral, DAILY    mycophenolate sodium (MYFORTIC) 540 mg, Oral, 2 TIMES DAILY    NOVOLOG FLEXPEN U-100 INSULIN 100 unit/mL (3 mL) Subcutaneous Insulin Pen INJECT 80 UNITS ONCE A DAY FOR 90 DAYS    nystatin (MYCOSTATIN) 100,000 unit/gram Cream Apply Topically, 2 TIMES DAILY    pantoprazole (PROTONIX) 40 mg, Oral, 2 TIMES DAILY    Pen Needle, Disposable, (BD UF MINI PEN NEEDLE) 31 gauge x 3/16" Needle USE 5 TIMES A DAY    potassium citrate (UROCIT-K) 10 mEq (1,080 mg) Oral Tablet Sustained Release 10 mEq, Oral, 2 TIMES DAILY WITH FOOD    pravastatin (PRAVACHOL) 20 mg, Oral, NIGHTLY    prednisoLONE acetate (PRED FORTE) 1 % Ophthalmic Drops, Suspension 1 Drop, Both Eyes, 2 TIMES DAILY    SSD 1 % Cream APPLY AS DIRECTED ONCE A DAY TO HEEL AND ANKLE    tacrolimus (PROGRAF) 3 mg, Oral, 2 TIMES DAILY    testosterone (ANDROGEL) 20.25 mg, Transdermal, DAILY    traZODone (DESYREL) 50 mg, Oral, NIGHTLY    trimethoprim-sulfamethoxazole (BACTRIM) 80-400mg  per tablet TAKE 1 TABLET 3 TIMES A WEEK    vitamin E 400 Units, Oral, DAILY        INTERVAL SURGERIES:   Past Surgical History:   Procedure Laterality Date    COLONOSCOPY      DEBRIDEMENT  FOOT Right     EYE SURGERY      FOOT SURGERY      HX HERNIA REPAIR      HX RENAL TRANSPLANT      1/20 pt has fu Feb 5th 12/19 Cr 1.69 GFR 45 6/19 Cr 1.41 GFR 56 3/19 Cr 1.67 GFR 46 11/18 seen by Dr. Clyde Canterbury, renal fx worsening, no comment on cause 11/18 Cr 1.65 GFR 478/18 Cr 1.95 GFR 38 6/18 Cr 1.5, Ca mildly high, SPE P/UPEPpending and asap f/u with nephrology 5/18 back to baseline cr 1.2 and GFR 68 CKD GFR 59 5/18 and was 59 11/17. Last Creat 1.36 increased from 1.2 11/17.    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  It seems that pt underwent  pancreas tp to "cure" DM/islet cells  a  few years ago pt seen by endo and given OHG had pancreatitis, thought due to the DM medicines    SHOULDER SURGERY      SURGERY OF LIP      basal cell ca    Radiation   16   tx Dr  Ned Grace        FAMILY HISTORY: Reviewed and unchanged    SOCIAL HISTORY: Reviewed and unchanged    REVIEW OF SYSTEMS  Pertinent review of systems as discussed in interval oncological history      PHYSICAL EXAMINATION  Physical Exam  HENT:      Head: Normocephalic.      Nose: Nose normal.      Mouth/Throat:      Mouth: Mucous membranes are moist.   Neurological:      Mental Status: He is alert.            ASSESSMENT & PLAN  Dathan, Banaszewski is a pleasant 58 year old with basal cell carcinoma of the lip post radiation treatments.  He is healed.  We will have him return in 6 months for follow up in his instructed to call in the interim should he have problems or concerns.    Molli Barrows, MD  05/20/2023 10:30     ZO:[email or web address]@

## 2023-05-22 ENCOUNTER — Telehealth (INDEPENDENT_AMBULATORY_CARE_PROVIDER_SITE_OTHER): Payer: Self-pay | Admitting: Internal Medicine

## 2023-05-22 ENCOUNTER — Other Ambulatory Visit (INDEPENDENT_AMBULATORY_CARE_PROVIDER_SITE_OTHER): Payer: Self-pay | Admitting: Internal Medicine

## 2023-05-22 MED ORDER — PROMETHAZINE-DM 6.25 MG-15 MG/5 ML ORAL SYRUP
5.0000 mL | ORAL_SOLUTION | Freq: Four times a day (QID) | ORAL | 1 refills | Status: DC | PRN
Start: 2023-05-22 — End: 2023-12-07

## 2023-05-22 MED ORDER — AZITHROMYCIN 250 MG TABLET
ORAL_TABLET | ORAL | 0 refills | Status: DC
Start: 2023-05-22 — End: 2023-06-05

## 2023-06-02 ENCOUNTER — Other Ambulatory Visit: Payer: Self-pay

## 2023-06-02 ENCOUNTER — Emergency Department (HOSPITAL_BASED_OUTPATIENT_CLINIC_OR_DEPARTMENT_OTHER): Payer: Medicare Other

## 2023-06-02 ENCOUNTER — Encounter (HOSPITAL_BASED_OUTPATIENT_CLINIC_OR_DEPARTMENT_OTHER): Payer: Self-pay

## 2023-06-02 ENCOUNTER — Emergency Department
Admission: EM | Admit: 2023-06-02 | Discharge: 2023-06-02 | Disposition: A | Discharge status: Home / Self Care | Payer: Medicare Other | Attending: FAMILY PRACTICE | Admitting: FAMILY PRACTICE

## 2023-06-02 ENCOUNTER — Telehealth (INDEPENDENT_AMBULATORY_CARE_PROVIDER_SITE_OTHER): Payer: Self-pay | Admitting: Internal Medicine

## 2023-06-02 DIAGNOSIS — N184 Chronic kidney disease, stage 4 (severe): Secondary | ICD-10-CM

## 2023-06-02 DIAGNOSIS — E1165 Type 2 diabetes mellitus with hyperglycemia: Secondary | ICD-10-CM | POA: Insufficient documentation

## 2023-06-02 DIAGNOSIS — E1122 Type 2 diabetes mellitus with diabetic chronic kidney disease: Secondary | ICD-10-CM | POA: Insufficient documentation

## 2023-06-02 DIAGNOSIS — E1151 Type 2 diabetes mellitus with diabetic peripheral angiopathy without gangrene: Secondary | ICD-10-CM | POA: Insufficient documentation

## 2023-06-02 DIAGNOSIS — Y830 Surgical operation with transplant of whole organ as the cause of abnormal reaction of the patient, or of later complication, without mention of misadventure at the time of the procedure: Secondary | ICD-10-CM | POA: Insufficient documentation

## 2023-06-02 DIAGNOSIS — E876 Hypokalemia: Secondary | ICD-10-CM | POA: Insufficient documentation

## 2023-06-02 DIAGNOSIS — N179 Acute kidney failure, unspecified: Secondary | ICD-10-CM | POA: Insufficient documentation

## 2023-06-02 DIAGNOSIS — T8619 Other complication of kidney transplant: Secondary | ICD-10-CM | POA: Insufficient documentation

## 2023-06-02 DIAGNOSIS — Z940 Kidney transplant status: Secondary | ICD-10-CM | POA: Insufficient documentation

## 2023-06-02 DIAGNOSIS — S91202A Unspecified open wound of left great toe with damage to nail, initial encounter: Secondary | ICD-10-CM | POA: Insufficient documentation

## 2023-06-02 DIAGNOSIS — M10272 Drug-induced gout, left ankle and foot: Secondary | ICD-10-CM | POA: Insufficient documentation

## 2023-06-02 DIAGNOSIS — Z720 Tobacco use: Secondary | ICD-10-CM

## 2023-06-02 DIAGNOSIS — I129 Hypertensive chronic kidney disease with stage 1 through stage 4 chronic kidney disease, or unspecified chronic kidney disease: Secondary | ICD-10-CM

## 2023-06-02 DIAGNOSIS — S91209A Unspecified open wound of unspecified toe(s) with damage to nail, initial encounter: Secondary | ICD-10-CM

## 2023-06-02 DIAGNOSIS — W1839XA Other fall on same level, initial encounter: Secondary | ICD-10-CM

## 2023-06-02 DIAGNOSIS — Z794 Long term (current) use of insulin: Secondary | ICD-10-CM

## 2023-06-02 DIAGNOSIS — W19XXXA Unspecified fall, initial encounter: Secondary | ICD-10-CM | POA: Insufficient documentation

## 2023-06-02 LAB — POC BLOOD GLUCOSE (RESULTS): GLUCOSE, POC: 196 mg/dL — ABNORMAL HIGH (ref 70–100)

## 2023-06-02 LAB — BASIC METABOLIC PANEL
ANION GAP: 9 mmol/L (ref 4–13)
BUN/CREA RATIO: 16
BUN: 50 mg/dL — ABNORMAL HIGH (ref 7–18)
CALCIUM: 10 mg/dL (ref 8.5–10.1)
CHLORIDE: 95 mmol/L — ABNORMAL LOW (ref 98–107)
CO2 TOTAL: 31 mmol/L (ref 21–32)
CREATININE: 3.14 mg/dL — ABNORMAL HIGH (ref 0.70–1.30)
ESTIMATED GFR: 22 mL/min/{1.73_m2} — ABNORMAL LOW (ref >59)
GLUCOSE: 208 mg/dL — ABNORMAL HIGH (ref 74–106)
OSMOLALITY, CALCULATED: 290 mosm/kg (ref 270–290)
POTASSIUM: 3.2 mmol/L — ABNORMAL LOW (ref 3.5–5.1)
SODIUM: 135 mmol/L — ABNORMAL LOW (ref 136–145)

## 2023-06-02 LAB — MANUAL DIFFERENTIAL
EOSINOPHIL %: 1 % (ref 0–7)
EOSINOPHIL ABSOLUTE: 0.07 10*3/uL (ref 0.00–0.80)
EOSINOPHILS MANUAL: 1
LYMPHOCYTE %: 28 % (ref 25–45)
LYMPHOCYTE ABSOLUTE: 2.07 10*3/uL (ref 1.10–5.00)
LYMPHOCYTES MANUAL: 28
MONOCYTE %: 9 % (ref 0–12)
MONOCYTE ABSOLUTE: 0.67 10*3/uL (ref 0.00–1.30)
MONOCYTES MANUAL: 9
NEUTROPHIL %: 62 % (ref 40–76)
NEUTROPHIL ABSOLUTE: 4.59 10*3/uL (ref 1.80–8.40)
NEUTROPHILS MANUAL: 62
PLATELET MORPHOLOGY COMMENT: ADEQUATE
TOTAL CELLS COUNTED [#] IN BLOOD: 100
WBC: 7.4 10*3/uL

## 2023-06-02 LAB — CBC WITH DIFF
HCT: 29.5 % — ABNORMAL LOW (ref 42.0–51.0)
HGB: 9.4 g/dL — ABNORMAL LOW (ref 13.5–18.0)
MCH: 31.1 pg (ref 27.0–32.0)
MCHC: 32 g/dL (ref 32.0–36.0)
MCV: 97.5 fL (ref 78.0–99.0)
MPV: 9.2 fL (ref 7.4–11.4)
PLATELETS: 259 10*3/uL (ref 140–440)
RBC: 3.03 10*6/uL — ABNORMAL LOW (ref 4.20–6.00)
RDW: 14.4 % (ref 11.6–14.8)
WBC: 7.4 10*3/uL (ref 4.0–10.5)

## 2023-06-02 LAB — LACTIC ACID LEVEL W/ REFLEX FOR LEVEL >2.0: LACTIC ACID: 1 mmol/L (ref 0.4–2.0)

## 2023-06-02 LAB — C-REACTIVE PROTEIN (CRP): C-REACTIVE PROTEIN (CRP): 8.3 mg/dL — ABNORMAL HIGH (ref 0.1–0.5)

## 2023-06-02 LAB — MAGNESIUM: MAGNESIUM: 1.8 mg/dL (ref 1.8–2.4)

## 2023-06-02 LAB — URIC ACID: URIC ACID: 14.2 mg/dL — ABNORMAL HIGH (ref 3.5–7.2)

## 2023-06-02 MED ORDER — SODIUM CHLORIDE 0.9 % INTRAVENOUS PIGGYBACK
INJECTION | INTRAVENOUS | Status: AC
Start: 2023-06-02 — End: 2023-06-02
  Filled 2023-06-02: qty 100

## 2023-06-02 MED ORDER — COLCHICINE 0.6 MG TABLET
0.6000 mg | ORAL_TABLET | Freq: Every day | ORAL | Status: DC
Start: 2023-06-02 — End: 2023-06-02
  Administered 2023-06-02: 0.6 mg via ORAL

## 2023-06-02 MED ORDER — COLCHICINE 0.6 MG TABLET
ORAL_TABLET | ORAL | Status: AC
Start: 2023-06-02 — End: 2023-06-02
  Filled 2023-06-02: qty 2

## 2023-06-02 MED ORDER — LINEZOLID IN 5% DEXTROSE IN WATER 600 MG/300 ML INTRAVENOUS PIGGYBACK
600.0000 mg | INJECTION | Freq: Two times a day (BID) | INTRAVENOUS | Status: DC
Start: 2023-06-02 — End: 2023-06-02
  Administered 2023-06-02: 0 mg via INTRAVENOUS
  Administered 2023-06-02: 600 mg via INTRAVENOUS

## 2023-06-02 MED ORDER — LINEZOLID IN 5% DEXTROSE IN WATER 600 MG/300 ML INTRAVENOUS PIGGYBACK
INJECTION | INTRAVENOUS | Status: AC
Start: 2023-06-02 — End: 2023-06-02
  Filled 2023-06-02: qty 300

## 2023-06-02 MED ORDER — HYDROCODONE 5 MG-ACETAMINOPHEN 325 MG TABLET
1.0000 | ORAL_TABLET | Freq: Four times a day (QID) | ORAL | 0 refills | Status: AC | PRN
Start: 2023-06-02 — End: 2023-06-05

## 2023-06-02 MED ORDER — COLCHICINE 0.6 MG TABLET
ORAL_TABLET | ORAL | Status: AC
Start: 2023-06-02 — End: 2023-06-02
  Filled 2023-06-02: qty 1

## 2023-06-02 MED ORDER — CEPHALEXIN 500 MG CAPSULE
500.0000 mg | ORAL_CAPSULE | Freq: Three times a day (TID) | ORAL | 0 refills | Status: AC
Start: 2023-06-02 — End: 2023-06-12

## 2023-06-02 MED ORDER — COLCHICINE 0.6 MG TABLET
1.2000 mg | ORAL_TABLET | Freq: Every day | ORAL | Status: DC
Start: 2023-06-02 — End: 2023-06-02
  Administered 2023-06-02: 1.2 mg via ORAL

## 2023-06-02 MED ORDER — ERTAPENEM 1 GRAM SOLUTION FOR INJECTION
INTRAMUSCULAR | Status: AC
Start: 2023-06-02 — End: 2023-06-02
  Filled 2023-06-02: qty 10

## 2023-06-02 MED ORDER — POTASSIUM BICARBONATE-CITRIC ACID 25 MEQ EFFERVESCENT TABLET
EFFERVESCENT_TABLET | ORAL | Status: AC
Start: 2023-06-02 — End: 2023-06-02
  Filled 2023-06-02: qty 2

## 2023-06-02 MED ORDER — POTASSIUM BICARBONATE-CITRIC ACID 25 MEQ EFFERVESCENT TABLET
50.0000 meq | EFFERVESCENT_TABLET | ORAL | Status: AC
Start: 2023-06-02 — End: 2023-06-02
  Administered 2023-06-02: 50 meq via ORAL

## 2023-06-02 MED ORDER — HYDROCODONE 5 MG-ACETAMINOPHEN 325 MG TABLET
1.0000 | ORAL_TABLET | Freq: Four times a day (QID) | ORAL | 0 refills | Status: DC | PRN
Start: 2023-06-02 — End: 2023-06-02

## 2023-06-02 MED ORDER — SODIUM CHLORIDE 0.9 % INTRAVENOUS PIGGYBACK
1000.0000 mg | INTRAVENOUS | Status: AC
Start: 2023-06-02 — End: 2023-06-02
  Administered 2023-06-02: 0 mg via INTRAVENOUS
  Administered 2023-06-02: 1000 mg via INTRAVENOUS

## 2023-06-02 NOTE — Discharge Instructions (Addendum)
You have an avulsed toenail, will put you on Keflex for that, continue your antibiotic ointment and keeping it wrapped at home.  You will need close follow up with your primary care provider for continued management of the toe.    You also have gout with a 14 uric acid level, you were given 2 doses of colchicine here in the ER, also need to follow up with Nephrology for assistance in management of your gout.  Call in the morning for a follow-up appointment and keep your appointment at [facility] transplant services as scheduled in February.    Continuing keep your blood pressure and cholesterol along with diabetes under good control.  Stop all tobacco use, do not change to vaping.    Continue current medications, gave you a few hydrocodone at your pharmacy to pick up for pain management through the next couple of days.  You can pick up your Keflex, and hydrocodone tonight or in the morning.

## 2023-06-02 NOTE — ED Nurses Note (Signed)
Pt offered any comfort measures (food, water, repositioning, restroom, etc.). Pt expresses no other concerns or needs at this time. Plan of care ongoing. Call light in reach.

## 2023-06-02 NOTE — ED Provider Notes (Signed)
[facility] - Emergency Department  ED Primary Provider Note  History of Present Illness   Chief Complaint   Patient presents with    Foot Injury     Patient states he fell a few days ago and hurt his left ankle and foot. Patient states that his power was off and his house shoe got tangled up with his sweat pant and down he went.     Martin Porter is a 58 y.o. male who had concerns including Foot Injury.  Arrival: The patient arrived by Car    This 58 year old male patient presents emergency department with swelling and pain to the right ankle, foot forefoot and great toe.  Patient going through the house, about 4 days ago with the power out, and his lounge pants got caught with a his foot and he fell forward, avulsed his left great toenail.  He had within a day or 2 later with the pain was so severe in his foot, and swelling they could barely walk or put weight on it.  Has a past medical history significant for high hypertension, hyperlipidemia, diabetes mellitus type 2 insulin requiring, hypomagnesemia, gastroesophageal reflux disease, chronic kidney disease, and OSA.  Past surgical history is significant for renal TAVR plant, shoulder surgery, hip surgery, herniorrhaphy upper endoscopy, debridement of the right foot, eye surgery colonoscopy.  Patient does not smoke, vape, use alcohol or illicit drugs.  He does chew tobacco.    Patient is not COVID or influenza vaccinated.  He has had COVID twice      Foot Injury    History Reviewed This Encounter:  Patient's past medical, surgical, social history reviewed noted.    Physical Exam   ED Triage Vitals [06/02/23 1406]   BP (Non-Invasive) (!) 163/81   Heart Rate 84   Respiratory Rate 20   Temperature 37.8 C (100 F)   SpO2 95 %   Weight 136 kg (300 lb)   Height 1.829 m (6')     Physical Exam  General: Patient appears uncomfortable but not toxic   Eyes: Anicteric  Ears: TMs clear without fluid or retraction   Nasal: Injected mucosa,  patent bilaterally slightly dry   Oral: Pink moist   Pharynx: Mild injection without PND, exudate, petechiae   Neck: Supple, trachea is in midline.    Chest: Symmetrical excursions   Lungs: Clear symmetrical fair aeration   Heart: Regular rate rhythm without murmur, distant   Abdomen: Morbidly obese, soft, normal bowel sounds nontender   Extremities:  Left lower extremity has swelling from just above the ankle down through the end of the toes.  Great toenail is avulsed.  Foot is warm very painful to palpation but no obvious deformities noted.  You can dorsiflex and plantar flex his feet, limited due to pain on the right foot, can move his toes.  Skin: Does have chronic stasis changes of the lower extremities, does have the avulsed toenail with granulation tissue noted knee left great toe.  Some redness and warmth on the plantar aspect of the metatarsophalangeal joint across the foot.  Neurological: No gross focal motor or sensory deficits.    Patient Data     Labs Ordered/Reviewed   BASIC METABOLIC PANEL - Abnormal; Notable for the following components:       Result Value    SODIUM 135 (*)     POTASSIUM 3.2 (*)     CHLORIDE 95 (*)     GLUCOSE 208 (*)  BUN 50 (*)     CREATININE 3.14 (*)     ESTIMATED GFR 22 (*)     All other components within normal limits    Narrative:     Estimated Glomerular Filtration Rate (eGFR) is calculated using the CKD-EPI (2021) equation, intended for patients 6 years of age and older. If gender is not documented or "unknown", there will be no eGFR calculation.   URIC ACID - Abnormal; Notable for the following components:    URIC ACID 14.2 (*)     All other components within normal limits   CBC WITH DIFF - Abnormal; Notable for the following components:    RBC 3.03 (*)     HGB 9.4 (*)     HCT 29.5 (*)     All other components within normal limits   POC BLOOD GLUCOSE (RESULTS) - Abnormal; Notable for the following components:    GLUCOSE, POC 196 (*)     All other components within normal  limits   LACTIC ACID LEVEL W/ REFLEX FOR LEVEL >2.0 - Normal   MAGNESIUM - Normal   ADULT ROUTINE BLOOD CULTURE, SET OF 2 BOTTLES (BACTERIA AND YEAST)   ADULT ROUTINE BLOOD CULTURE, SET OF 2 BOTTLES (BACTERIA AND YEAST)   CBC/DIFF    Narrative:     The following orders were created for panel order CBC/DIFF.  Procedure                               Abnormality         Status                     ---------                               -----------         ------                     CBC WITH WUJW[[id number]]                Abnormal            Final result               MANUAL DIFFERENTIAL[[id number]]                              Final result                 Please view results for these tests on the individual orders.   MANUAL DIFFERENTIAL   C-REACTIVE PROTEIN (CRP)     CT FOOT LEFT WO IV CONTRAST   Final Result by Edi, Radresults In (01/28 1739)   NEGATIVE FOR OSTEOMYELITIS. NO ABSCESS IDENTIFIED         One or more dose reduction techniques were used (e.g., Automated exposure control, adjustment of the mA and/or kV according to patient size, use of iterative reconstruction technique).         Radiologist location ID: [id number]           Medical Decision Making        Medical Decision Making  High complexity due to patient's presentation comorbidities, differential includes cellulitis, osteomyelitis, avulsed toenail, fracture, dislocation, gout, PND.    Amount and/or Complexity of Data Reviewed  Labs: ordered.  Details: Labs reviewed and noted, uric acid level 14.2 white count is 7.4 with a hemoglobin of 9.4.  Potassium was 3.2 and replaced, renal urine creatinine 50 and 3.1, glucose 206  Radiology: ordered.    Risk  Prescription drug management.      ED Course as of 06/02/23 1942   Tue Jun 02, 2023   1904 Labs and CT discussed with the patient, appears to be a gout attack posterior cellulitis especially from the avulsed toenail, will discharge on cephalexin and we will give him another dose of colchicine now.  He  has end-stage 4 chronic kidney disease, and was 3 months ago.  He has end-stage 3b renal failure however 7 months ago he was stage IIIa, he has had progressive renal changes.  He is due to see his transplant team physician in February or March, he sees Dr. Dutch Gray  here, will call him for follow up in the morning.            Medications Administered in the ED   linezolid (ZYVOX) 600 mg in iso-osmotic 300 mL premix IVPB (0 mg Intravenous Stopped 06/02/23 1831)   colchicine tablet (1.2 mg Oral Given 06/02/23 1844)   colchicine tablet (0.6 mg Oral Given 06/02/23 1924)   potassium bicarbonate-citric acid (EFFER-K) effervescent tablet (50 mEq Oral Given 06/02/23 1710)   ertapenem (INVanz) 1,000 mg in NS 100 mL IVPB minibag (0 mg Intravenous Stopped 06/02/23 1713)     Clinical Impression   Uncontrolled type 2 diabetes mellitus with hyperglycemia (CMS HCC)   Hypokalemia   Acute drug-induced gout of left foot (Primary)   Avulsion of toenail, initial encounter   Acute renal failure superimposed on stage 4 chronic kidney disease (CMS HCC)   History of renal transplant       Disposition: Discharged

## 2023-06-02 NOTE — ED Nurses Note (Signed)
Patient discharged home with family/self with treatment of initial complaint upon arrival to ER.  AVS reviewed by physician with patient/care giver.  A written copy of the AVS and discharge instructions was given to the patient/care giver. Scripts x1 handed to patient/care giver or sent to pharmacy indicated on AVS print out. Pt verbalized understanding of taking any medications as prescribed. Questions and concerns sufficiently answered as needed.  Patient/care giver encouraged to follow up with PCP as indicated.  In the event of an emergency, patient/care giver instructed to call 911 or go to the nearest emergency room. No other concerns voiced at time of discharge to physician or nurse. Respiration even and unlabored. Pt wheeled out of ED into personal vehicle.

## 2023-06-04 ENCOUNTER — Telehealth (INDEPENDENT_AMBULATORY_CARE_PROVIDER_SITE_OTHER): Payer: Self-pay | Admitting: Internal Medicine

## 2023-06-04 NOTE — Telephone Encounter (Signed)
Post Ed Follow-Up    Post ED Follow-Up:   Document completed and/or attempted interactive contact(s) after transition to home after emergency department stay.:   Transition Facility and relevant Date:   Discharge Date: 06/02/23  Discharge from [facility] Emergency Department?: Yes  Contacted by: Royal Hawthorn method: Patient/Caregiver Telephone  Contact completed: 06/04/2023 12:35 PM  Contact first attempt: 06/04/2023 12:35 PM  MyChart message sent?: No  Was the AVS reviewed with patient?: No  How is the patient recovering?: Improving  Medications prescribed: Yes  Were they obtained?: Yes  Interventions: No needs identified

## 2023-06-05 ENCOUNTER — Encounter (INDEPENDENT_AMBULATORY_CARE_PROVIDER_SITE_OTHER): Payer: Self-pay | Admitting: Internal Medicine

## 2023-06-05 ENCOUNTER — Other Ambulatory Visit: Payer: Self-pay

## 2023-06-05 ENCOUNTER — Ambulatory Visit: Payer: Medicare Other | Attending: Internal Medicine | Admitting: Internal Medicine

## 2023-06-05 DIAGNOSIS — W19XXXD Unspecified fall, subsequent encounter: Secondary | ICD-10-CM

## 2023-06-05 DIAGNOSIS — E790 Hyperuricemia without signs of inflammatory arthritis and tophaceous disease: Secondary | ICD-10-CM

## 2023-06-05 DIAGNOSIS — M79672 Pain in left foot: Secondary | ICD-10-CM

## 2023-06-05 DIAGNOSIS — M25572 Pain in left ankle and joints of left foot: Secondary | ICD-10-CM

## 2023-06-05 DIAGNOSIS — E876 Hypokalemia: Secondary | ICD-10-CM

## 2023-06-05 DIAGNOSIS — S91109S Unspecified open wound of unspecified toe(s) without damage to nail, sequela: Secondary | ICD-10-CM

## 2023-06-05 DIAGNOSIS — W19XXXA Unspecified fall, initial encounter: Secondary | ICD-10-CM

## 2023-06-05 DIAGNOSIS — M109 Gout, unspecified: Secondary | ICD-10-CM

## 2023-06-05 MED ORDER — ALLOPURINOL 100 MG TABLET
100.0000 mg | ORAL_TABLET | Freq: Every day | ORAL | 2 refills | Status: DC
Start: 2023-06-05 — End: 2023-08-20

## 2023-06-05 MED ORDER — MUPIROCIN 2 % TOPICAL OINTMENT
TOPICAL_OINTMENT | Freq: Three times a day (TID) | CUTANEOUS | 2 refills | Status: DC
Start: 2023-06-05 — End: 2023-12-07

## 2023-06-05 NOTE — Progress Notes (Signed)
INTERNAL MEDICINE, BUILDING A  [address]  [place] [address]  Operated by [facility]  History and Physical    Name: Martin Porter MRN:  [id number]   Date: 06/05/2023 DOB:  Nov 04, 1965 (58 y.o.)         Name: Martin Porter                       Date of Birth: [birth year]   MRN:  [id number]                         Date of visit: 06/05/2023     PCP: Samuella Cota, PA-C     Subjective  Martin Porter is a 58 y.o. year old male who presents for ED Follow-up (Ed follow up  pt states that he fell and went to er states they put him on antibiotics and sent in a pain medication but did not get it because the put wrong name on the rx and they was going to give him antibiotic cream but they didn't send that either would like you to send it in. Pt states that his foot is feeling some better.)   to clinic.  No specialty comments available.   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 12/17 MILD, WILL FOLLOW UP WITH SLEEP MED FOR CPAP MAY  BE BECAUSE FATIGUE      Candida infection 07/15/2021      Current Outpatient Medications   Medication Sig    acetaminophen (TYLENOL) 500 mg Oral Tablet Take 1 Tablet (500 mg total) by mouth Every 4 hours as needed for Pain    amLODIPine (NORVASC) 5 mg Oral Tablet TAKE ONE TABLET BY MOUTH TWICE A DAY    aspirin (ECOTRIN) 81 mg Oral Tablet, Delayed Release (E.C.) Take 1 Tablet (81 mg total) by mouth    atenoloL (TENORMIN) 50 mg Oral Tablet TAKE ONE TABLET BY MOUTH TWICE A DAY    atropine (ISOPTO ATROPINE) 1 % Ophthalmic Drops 1 Drop Four times a day    carvediloL (COREG) 25 mg Oral Tablet TAKE ONE TABLET BY MOUTH TWICE A DAY WITH FOOD    cephalexin (KEFLEX) 500 mg Oral Capsule Take 1 Capsule (500 mg total) by mouth Three times a day for 10 days    cholecalciferol, vitamin D3, 1,250 mcg (50,000 unit) Oral  Capsule TAKE 1 CAPSULE BY MOUTH ONCE WEEKLY    cloNIDine HCL (CATAPRES) 0.1 mg Oral Tablet TAKE ONE TABLET BY MOUTH TWICE A DAY    CREON 36,000-114,000- 180,000 unit Oral Capsule, Delayed Release(E.C.) TAKE TWO CAPSULES BY MOUTH THREE TIMES A DAY WITH MEALS    cyanocobalamin (VITAMIN B 12) 1,000 mcg Oral Tablet Take 2 Tablets (2,000 mcg total) by mouth    docusate sodium (COLACE) 100 mg Oral Capsule Take 1 Capsule (100 mg total) by mouth    dorzolamide-timoloL (COSOPT) 22.3-6.8 mg/mL Ophthalmic Drops Administer 1 Drop into the left eye Twice daily    ergocalciferol, vitamin D2, (DRISDOL) 1,250 mcg (50,000 unit) Oral Capsule Take 1 Capsule (50,000 Units total) by mouth Every 7 days    famotidine (PEPCID) 40 mg Oral Tablet TAKE ONE TABLET BY MOUTH TWICE A DAY    fenofibrate (LOFIBRA) 160 mg Oral Tablet TAKE ONE TABLET BY MOUTH EVERY DAY    furosemide (LASIX) 20 mg Oral Tablet TAKE ONE TABLET BY MOUTH EVERY DAY    gabapentin (NEURONTIN) 800 mg Oral Tablet TAKE ONE TABLET BY MOUTH THREE TIMES A DAY    hydroCHLOROthiazide (HYDRODIURIL) 25 mg  Oral Tablet Take 1 Tablet (25 mg total) by mouth Once a day    HYDROcodone-acetaminophen (NORCO) 5-325 mg Oral Tablet Take 1 Tablet by mouth Every 6 hours as needed for Pain for up to 3 days Indications: pain, Gout pain left foot    insulin glargine (BASAGLAR KWIKPEN U-100 INSULIN) 100 unit/mL Subcutaneous Insulin Pen INJECT 95 UNITS UNDER THE SKIN AT BEDTIME    iron ps complex-B12-folic acid (POLY-IRON 150 FORTE) 150-25-1 mg-mcg-mg Oral Capsule Take 2 Capsules by mouth Once a day    magnesium oxide (MAG-OX) 400 mg Oral Tablet TAKE ONE TABLET BY MOUTH THREE TIMES A DAY    multivitamin with iron Oral Tablet Take 1 Tablet by mouth Once a day    mycophenolate sodium (MYFORTIC) 180 mg Oral Tablet, Delayed Release (E.C.) Take 3 Tablets (540 mg total) by mouth Twice daily    NOVOLOG FLEXPEN U-100 INSULIN 100 unit/mL (3 mL) Subcutaneous Insulin Pen INJECT 80 UNITS ONCE A DAY FOR 90 DAYS (Patient taking differently: Inject 100 Units under the skin Once a day)    nystatin (MYCOSTATIN) 100,000 unit/gram Cream Apply topically Twice daily    pantoprazole (PROTONIX) 40 mg Oral Tablet, Delayed Release (E.C.) TAKE ONE TABLET BY MOUTH TWICE A DAY    Pen Needle, Disposable, (BD UF MINI PEN NEEDLE) 31 gauge x 3/16" Needle USE 5 TIMES A DAY    potassium citrate (UROCIT-K) 10 mEq (1,080 mg) Oral Tablet Sustained Release Take 1 Tablet (10 mEq total) by mouth Twice daily with food    pravastatin (PRAVACHOL) 20 mg Oral Tablet Take 1 Tablet (20 mg total) by mouth Every night    prednisoLONE acetate (PRED FORTE) 1 % Ophthalmic Drops, Suspension Instill 1 Drop into both eyes Twice daily    promethazine-dextromethorphan (PHENERGAN-DM) 6.25-15 mg/5 mL Oral Syrup Take 5 mL by mouth Four times a day as needed for Cough (Patient not taking: Reported on 06/05/2023)    SSD 1 % Cream APPLY AS DIRECTED ONCE A DAY TO HEEL AND ANKLE    tacrolimus (PROGRAF) 1 mg Oral Capsule TAKE THREE CAPSULES BY MOUTH TWICE A DAY FOR KIDNEY TRANSPLANT    testosterone  (ANDROGEL) 20.25 mg/1.25 gram (1.62 %) Transdermal Gel in Metered-dose Pump Place 20.25 mg on the skin Once a day    traZODone (DESYREL) 50 mg  Oral Tablet Take 1 Tablet (50 mg total) by mouth Every night    trimethoprim-sulfamethoxazole (BACTRIM) 80-400mg  per tablet TAKE 1 TABLET 3 TIMES A WEEK    vitamin E 400 unit Oral Capsule Take 1 Capsule (400 Units total) by mouth Once a day            ER FU   This  58 yo was walking through house and he got tripped up on his PJ pants     Pt had pain and swelling  in  foot toe ankle on left side   Pt co pain  to walk   Pt was sent to ER     Work up at ER included work up   uric acid  >  14    and pt was given colchicine    Pt did get keflex but no pain med   he says today is better  regarding walking    All labs  and  ct  discussed     Pt went over diet  and meds   Pt has been strict with taking his meds   He said  er  doctor scared him  however  his numbers were the same   7 mo  ago  Again I told pt we just need to see Dr Clyde Canterbury and  get his opinion   gfr 23  creatine  3.14       Pt says he can walk and doing better with this but still sore  in ankle and toe   All studies reviewed with pt   Pt denies any  cp sob  no nvd  no fever or chills      Pt is transplant renal pt of  20 yrs and has been doing good  and compliant         Medications Administered in the ED  linezolid (ZYVOX) 600 mg in iso-osmotic 300 mL premix IVPB (0 mg Intravenous Stopped 06/02/23 1831)  colchicine tablet (1.2 mg Oral Given 06/02/23 1844)  colchicine tablet (0.6 mg Oral Given 06/02/23 1924)  potassium bicarbonate-citric acid (EFFER-K) effervescent tablet (50 mEq Oral Given 06/02/23 1710)  ertapenem (INVanz) 1,000 mg in NS 100 mL IVPB minibag (0 mg Intravenous Stopped 06/02/23 1713)    Medication Changes      cephalexin 500 mg Oral 3 TIMES DAILY    hydrocodone/acetaminophen 5-325 mg 1 Tablet Oral EVERY 6 HOURS PRN  PT SAYS He NEVER GOT THIS MEDICATION     Ct FOOT    NO SIGNS OF  OSTEOMYELITIS      All work  up  reviewed and discussed                   REVIEW OF SYSTEMS:   Review of Systems  Review of Systems have been reviewed    Objective:   There were no vitals taken for this visit.             PHYSICAL EXAM  Physical Exam  Gen: NAD. Alert.     Exam  Const  General: cooperative, no acute distress and alert  Resp  Effort & Inspection: able to speak in complete sentences  Psych  Mental Status: mental status grossly normal  Mood: congruent mood  Affect: normal affect  Attitude: cooperative  Insight: insight good  Judgment: judgment good    Assessment & Plan  Fall    Ankle pain, left    Left foot pain    Avulsion  of toe, sequela    Hyperuricemia    Gout, unspecified cause, unspecified chronicity, unspecified site    Hypokalemia     ADD ALLOPURINOL  100 MG ONE DAILY   Finish keflex from  er    Time on phone with pt   35 minutes     PT to call dr Tammy Sours office and  ask for sooner appt   Pt will need  fu labs next week if he does not see sekkarie we  will need to have pt in person for fu   Er records  reviewed   Pt could not come today   I personally offered the service to the patient, and obtained verbal consent to provide this service.    Samuella Cota, PA-C

## 2023-06-05 NOTE — Nursing Note (Signed)
Ed follow up  pt states that he fell and went to er states they put him on antibiotics and sent in a pain medication but did not get it because the put wrong name on the rx and they was going to give him antibiotic cream but they didn't send that either would like you to send it in. Pt states that his foot is feeling some better.

## 2023-06-07 LAB — ADULT ROUTINE BLOOD CULTURE, SET OF 2 BOTTLES (BACTERIA AND YEAST)
BLOOD CULTURE, ROUTINE: NO GROWTH
BLOOD CULTURE, ROUTINE: NO GROWTH

## 2023-06-09 ENCOUNTER — Other Ambulatory Visit (INDEPENDENT_AMBULATORY_CARE_PROVIDER_SITE_OTHER): Payer: Self-pay | Admitting: Internal Medicine

## 2023-06-11 ENCOUNTER — Other Ambulatory Visit: Payer: Self-pay

## 2023-06-11 ENCOUNTER — Encounter (INDEPENDENT_AMBULATORY_CARE_PROVIDER_SITE_OTHER): Payer: Self-pay | Admitting: Internal Medicine

## 2023-06-11 ENCOUNTER — Ambulatory Visit: Payer: Medicare Other | Attending: Internal Medicine | Admitting: Internal Medicine

## 2023-06-11 VITALS — BP 142/68 | HR 85 | Temp 98.0°F

## 2023-06-11 DIAGNOSIS — N179 Acute kidney failure, unspecified: Secondary | ICD-10-CM | POA: Insufficient documentation

## 2023-06-11 DIAGNOSIS — E790 Hyperuricemia without signs of inflammatory arthritis and tophaceous disease: Secondary | ICD-10-CM | POA: Insufficient documentation

## 2023-06-11 DIAGNOSIS — S91109S Unspecified open wound of unspecified toe(s) without damage to nail, sequela: Secondary | ICD-10-CM | POA: Insufficient documentation

## 2023-06-11 DIAGNOSIS — Z940 Kidney transplant status: Secondary | ICD-10-CM | POA: Insufficient documentation

## 2023-06-11 DIAGNOSIS — E119 Type 2 diabetes mellitus without complications: Secondary | ICD-10-CM | POA: Insufficient documentation

## 2023-06-11 DIAGNOSIS — Z794 Long term (current) use of insulin: Secondary | ICD-10-CM | POA: Insufficient documentation

## 2023-06-11 DIAGNOSIS — N184 Chronic kidney disease, stage 4 (severe): Secondary | ICD-10-CM | POA: Insufficient documentation

## 2023-06-11 DIAGNOSIS — E782 Mixed hyperlipidemia: Secondary | ICD-10-CM | POA: Insufficient documentation

## 2023-06-11 LAB — HGA1C (HEMOGLOBIN A1C WITH EST AVG GLUCOSE): HEMOGLOBIN A1C: 6.7 % — ABNORMAL HIGH (ref 4.0–6.0)

## 2023-06-11 LAB — LIPID PANEL
CHOL/HDL RATIO: 4.3
CHOLESTEROL: 162 mg/dL (ref <200)
HDL CHOL: 38 mg/dL — ABNORMAL LOW (ref >=40)
LDL CALC: 75 mg/dL (ref 0–100)
TRIGLYCERIDES: 246 mg/dL — ABNORMAL HIGH (ref <=150)
VLDL CALC: 49 mg/dL (ref 0–50)

## 2023-06-11 LAB — CBC WITH DIFF
BASOPHIL #: 0 10*3/uL (ref 0.00–0.10)
BASOPHIL %: 1 % (ref 0–1)
EOSINOPHIL #: 0.2 10*3/uL (ref 0.00–0.50)
EOSINOPHIL %: 3 % (ref 1–8)
HCT: 28.4 % — ABNORMAL LOW (ref 36.7–47.1)
HGB: 9.5 g/dL — ABNORMAL LOW (ref 12.5–16.3)
LYMPHOCYTE #: 1.1 10*3/uL (ref 1.00–3.20)
LYMPHOCYTE %: 21 % (ref 15–43)
MCH: 30.7 pg (ref 23.8–33.4)
MCHC: 33.4 g/dL (ref 32.5–36.3)
MCV: 92 fL (ref 73.0–96.2)
MONOCYTE #: 0.5 10*3/uL (ref 0.30–1.10)
MONOCYTE %: 9 % (ref 6–14)
MPV: 9.8 fL (ref 7.4–11.4)
NEUTROPHIL #: 3.3 10*3/uL (ref 1.70–7.60)
NEUTROPHIL %: 66 % (ref 44–74)
PLATELETS: 316 10*3/uL (ref 140–440)
RBC: 3.09 10*6/uL — ABNORMAL LOW (ref 4.06–5.63)
RDW: 12.9 % (ref 12.1–16.2)
WBC: 5.1 10*3/uL (ref 3.6–10.2)

## 2023-06-11 LAB — COMPREHENSIVE METABOLIC PANEL, NON-FASTING
ALBUMIN/GLOBULIN RATIO: 1.2 (ref 0.8–1.4)
ALBUMIN: 3.7 g/dL (ref 3.5–5.7)
ALKALINE PHOSPHATASE: 50 U/L (ref 34–104)
ALT (SGPT): 23 U/L (ref 7–52)
ANION GAP: 10 mmol/L (ref 4–13)
AST (SGOT): 20 U/L (ref 13–39)
BILIRUBIN TOTAL: 0.4 mg/dL (ref 0.3–1.0)
BUN/CREA RATIO: 18 (ref 6–22)
BUN: 52 mg/dL — ABNORMAL HIGH (ref 7–25)
CALCIUM, CORRECTED: 9.9 mg/dL (ref 8.9–10.8)
CALCIUM: 9.7 mg/dL (ref 8.6–10.3)
CHLORIDE: 103 mmol/L (ref 98–107)
CO2 TOTAL: 26 mmol/L (ref 21–31)
CREATININE: 2.95 mg/dL — ABNORMAL HIGH (ref 0.60–1.30)
ESTIMATED GFR: 24 mL/min/{1.73_m2} — ABNORMAL LOW (ref >59)
GLOBULIN: 3.2 (ref 2.0–3.5)
GLUCOSE: 214 mg/dL — ABNORMAL HIGH (ref 74–109)
OSMOLALITY, CALCULATED: 298 mosm/kg — ABNORMAL HIGH (ref 270–290)
POTASSIUM: 4.5 mmol/L (ref 3.5–5.1)
PROTEIN TOTAL: 6.9 g/dL (ref 6.4–8.9)
SODIUM: 139 mmol/L (ref 136–145)

## 2023-06-11 LAB — THYROID STIMULATING HORMONE (SENSITIVE TSH): TSH: 2.75 u[IU]/mL (ref 0.450–5.330)

## 2023-06-11 LAB — URIC ACID: URIC ACID: 11.6 mg/dL — ABNORMAL HIGH (ref 2.3–7.6)

## 2023-06-11 LAB — MICROALBUMIN/CREATININE RATIO, URINE, RANDOM
CREATININE RANDOM URINE: 82 mg/dL (ref 30–125)
MICROALBUMIN RANDOM URINE: 27.1 mg/dL
MICROALBUMIN/CREATININE RATIO RANDOM URINE: 330.5 mg/g

## 2023-06-11 LAB — PARATHYROID HORMONE (PTH): PTH: 82.1 pg/mL (ref 12.0–88.0)

## 2023-06-11 NOTE — Progress Notes (Signed)
INTERNAL MEDICINE, BUILDING A  [address]  [place] [address]  Operated by [facility]  Progress Note    Name: Martin Porter MRN:  [id number]   Date: 06/11/2023 DOB:  06-Dec-1965 (58 y.o.)              Chief Complaint: Foot Problem (Follow up on left foot)       HPI: Martin Porter is a 58 y.o. male who complains of  avulsion of  left  great   toe   Pt was seen in ER  and no fx  pt  has been on Keflex  and doing well this patient could not come in to the office with the ER follow-up we did phone he has been keeping the area clean and putting on topical antibiotic cream due to weather he has not been able to come out to be seen.  Patient has not had any fever chills due to neuropathy patient states that he has not really having any significant pain in the toe at this time it is much improved    Follow-up AKI  on top of CKD  pt to see Dr  Clyde Canterbury     Patient is mostly concerned regarding the elevation and crawled drop in GFR this last time he has been eating stage III 4 chronic kidney disease and has seen Dr. Earna Coder however he generally follows with him once a year and he follows with the transplant team once a year on he could have been dry could be because of the infection of the acute change go to gets called acute kidney injury on top of his chronic kidney disease he does see Dr. Clyde Canterbury a the 18th of February I told the patient we will repeat his blood work today reassess the situation he is compliant with his medications for the transplant and he does drink plenty of fluids.    Allergies:  No Known Allergies    acetaminophen (TYLENOL) 500 mg Oral Tablet, Take 1 Tablet (500 mg total) by mouth Every 4 hours as needed for Pain  allopurinoL (ZYLOPRIM) 100 mg Oral Tablet, Take 1 Tablet (100 mg total) by mouth Once a day  amLODIPine (NORVASC) 5 mg Oral Tablet, TAKE ONE TABLET BY MOUTH TWICE A DAY  aspirin (ECOTRIN) 81 mg Oral Tablet, Delayed Release (E.C.), Take 1 Tablet (81 mg  total) by mouth  atenoloL (TENORMIN) 50 mg Oral Tablet, TAKE ONE TABLET BY MOUTH TWICE A DAY  atropine (ISOPTO ATROPINE) 1 % Ophthalmic Drops, 1 Drop Four times a day  carvediloL (COREG) 25 mg Oral Tablet, TAKE ONE TABLET BY MOUTH TWICE A DAY WITH FOOD  cephalexin (KEFLEX) 500 mg Oral Capsule, Take 1 Capsule (500 mg total) by mouth Three times a day for 10 days  cholecalciferol, vitamin D3, 1,250 mcg (50,000 unit) Oral Capsule, TAKE 1 CAPSULE BY MOUTH ONCE WEEKLY  cloNIDine HCL (CATAPRES) 0.1 mg Oral Tablet, TAKE ONE TABLET BY MOUTH TWICE A DAY  CREON 36,000-114,000- 180,000 unit Oral Capsule, Delayed Release(E.C.), TAKE TWO CAPSULES BY MOUTH THREE TIMES A DAY WITH MEALS  cyanocobalamin (VITAMIN B 12) 1,000 mcg Oral Tablet, Take 2 Tablets (2,000 mcg total) by mouth  docusate sodium (COLACE) 100 mg Oral Capsule, Take 1 Capsule (100 mg total) by mouth  dorzolamide-timoloL (COSOPT) 22.3-6.8 mg/mL Ophthalmic Drops, Administer 1 Drop into the left eye Twice daily  ergocalciferol, vitamin D2, (DRISDOL) 1,250 mcg (50,000 unit) Oral Capsule, Take 1 Capsule (50,000 Units total) by mouth  Every 7 days  famotidine (PEPCID) 40 mg Oral Tablet, TAKE ONE TABLET BY MOUTH TWICE A DAY  fenofibrate (LOFIBRA) 160 mg Oral Tablet, TAKE ONE TABLET BY MOUTH EVERY DAY  furosemide (LASIX) 20 mg Oral Tablet, TAKE ONE TABLET BY MOUTH EVERY DAY  gabapentin (NEURONTIN) 800 mg Oral Tablet, TAKE ONE TABLET BY MOUTH THREE TIMES A DAY  hydroCHLOROthiazide (HYDRODIURIL) 25 mg Oral Tablet, Take 1 Tablet (25 mg total) by mouth Once a day  insulin glargine (BASAGLAR KWIKPEN U-100 INSULIN) 100 unit/mL Subcutaneous Insulin Pen, INJECT 95 UNITS UNDER THE SKIN AT BEDTIME  iron ps complex-B12-folic acid (POLY-IRON 150 FORTE) 150-25-1 mg-mcg-mg Oral Capsule, TAKE TWO CAPSULES BY MOUTH EVERY DAY  magnesium oxide (MAG-OX) 400 mg Oral Tablet, TAKE ONE TABLET BY MOUTH THREE TIMES A DAY  multivitamin with iron Oral Tablet, Take 1 Tablet by mouth Once a  day  mupirocin (BACTROBAN) 2 % Ointment, Apply topically Three times a day  mycophenolate sodium (MYFORTIC) 180 mg Oral Tablet, Delayed Release (E.C.), Take 3 Tablets (540 mg total) by mouth Twice daily  NOVOLOG FLEXPEN U-100 INSULIN 100 unit/mL (3 mL) Subcutaneous Insulin Pen, INJECT 80 UNITS ONCE A DAY FOR 90 DAYS (Patient taking differently: Inject 100 Units under the skin Once a day)  nystatin (MYCOSTATIN) 100,000 unit/gram Cream, Apply topically Twice daily  pantoprazole (PROTONIX) 40 mg Oral Tablet, Delayed Release (E.C.), TAKE ONE TABLET BY MOUTH TWICE A DAY  Pen Needle, Disposable, (BD UF MINI PEN NEEDLE) 31 gauge x 3/16" Needle, USE 5 TIMES A DAY  potassium citrate (UROCIT-K) 10 mEq (1,080 mg) Oral Tablet Sustained Release, Take 1 Tablet (10 mEq total) by mouth Twice daily with food  pravastatin (PRAVACHOL) 20 mg Oral Tablet, Take 1 Tablet (20 mg total) by mouth Every night  prednisoLONE acetate (PRED FORTE) 1 % Ophthalmic Drops, Suspension, Instill 1 Drop into both eyes Twice daily  promethazine-dextromethorphan (PHENERGAN-DM) 6.25-15 mg/5 mL Oral Syrup, Take 5 mL by mouth Four times a day as needed for Cough (Patient not taking: Reported on 06/05/2023)  SSD 1 % Cream, APPLY AS DIRECTED ONCE A DAY TO HEEL AND ANKLE  tacrolimus (PROGRAF) 1 mg Oral Capsule, TAKE THREE CAPSULES BY MOUTH TWICE A DAY FOR KIDNEY TRANSPLANT  testosterone (ANDROGEL) 20.25 mg/1.25 gram (1.62 %) Transdermal Gel in Metered-dose Pump, Place 20.25 mg on the skin Once a day  traZODone (DESYREL) 50 mg Oral Tablet, Take 1 Tablet (50 mg total) by mouth Every night  trimethoprim-sulfamethoxazole (BACTRIM) 80-400mg  per tablet, TAKE 1 TABLET 3 TIMES A WEEK  vitamin E 400 unit Oral Capsule, Take 1 Capsule (400 Units total) by mouth Once a day    No facility-administered medications prior to visit.       Review of Systems -     OBJECTIVE:  Vitals:    06/11/23 1103   BP: (!) 142/68   Pulse: 85   Temp: 36.7 C (98 F)   SpO2: 95%      Physical  Exam     Exam-AMB  Const  General: cooperative, no acute distress and alert  EYE: PERRL  Nl Conjunctiva  Sclera nonicteric   HENMT  Head: normal to inspection  General nose exam: external nose normal  Neck  Neck: normal visual inspection  Lymphatic: no lymphadenopathy noted  Resp  Auscultation: clear without RRW    Avulsion of  left  great  toe nail  minimal tenderness no redness to the  toe   no signs of infection   area  smooth         ASSESSMENT:     ICD-10-CM    1. Type 2 diabetes mellitus without complication, with long-term current use of insulin (CMS HCC)  E11.9 URIC ACID    Z79.4 COMPREHENSIVE METABOLIC PANEL, NON-FASTING     PARATHYROID HORMONE (PTH)     MICROALBUMIN/CREATININE RATIO, URINE, RANDOM     CBC/DIFF     THYROID STIMULATING HORMONE (SENSITIVE TSH)     HGA1C (HEMOGLOBIN A1C WITH EST AVG GLUCOSE)     LIPID PANEL      2. Renal transplant recipient  Z94.0 URIC ACID     COMPREHENSIVE METABOLIC PANEL, NON-FASTING     PARATHYROID HORMONE (PTH)     MICROALBUMIN/CREATININE RATIO, URINE, RANDOM     CBC/DIFF     THYROID STIMULATING HORMONE (SENSITIVE TSH)     HGA1C (HEMOGLOBIN A1C WITH EST AVG GLUCOSE)     LIPID PANEL      3. AKI (acute kidney injury) (CMS HCC)  N17.9 URIC ACID     COMPREHENSIVE METABOLIC PANEL, NON-FASTING     PARATHYROID HORMONE (PTH)     MICROALBUMIN/CREATININE RATIO, URINE, RANDOM     CBC/DIFF     THYROID STIMULATING HORMONE (SENSITIVE TSH)     HGA1C (HEMOGLOBIN A1C WITH EST AVG GLUCOSE)     LIPID PANEL      4. CKD (chronic kidney disease) stage 4, GFR 15-29 ml/min (CMS HCC)  N18.4 URIC ACID     COMPREHENSIVE METABOLIC PANEL, NON-FASTING     PARATHYROID HORMONE (PTH)     MICROALBUMIN/CREATININE RATIO, URINE, RANDOM     CBC/DIFF     THYROID STIMULATING HORMONE (SENSITIVE TSH)     HGA1C (HEMOGLOBIN A1C WITH EST AVG GLUCOSE)     LIPID PANEL      5. Hyperuricemia  E79.0 URIC ACID     COMPREHENSIVE METABOLIC PANEL, NON-FASTING     PARATHYROID HORMONE (PTH)     MICROALBUMIN/CREATININE RATIO,  URINE, RANDOM     CBC/DIFF     THYROID STIMULATING HORMONE (SENSITIVE TSH)     HGA1C (HEMOGLOBIN A1C WITH EST AVG GLUCOSE)     LIPID PANEL      6. Mixed hyperlipidemia  E78.2 URIC ACID     COMPREHENSIVE METABOLIC PANEL, NON-FASTING     PARATHYROID HORMONE (PTH)     MICROALBUMIN/CREATININE RATIO, URINE, RANDOM     CBC/DIFF     THYROID STIMULATING HORMONE (SENSITIVE TSH)     HGA1C (HEMOGLOBIN A1C WITH EST AVG GLUCOSE)     LIPID PANEL      7. Avulsion of toe, sequela  S91.109S           Orders Placed This Encounter    URIC ACID    COMPREHENSIVE METABOLIC PANEL, NON-FASTING    PARATHYROID HORMONE (PTH)    MICROALBUMIN/CREATININE RATIO, URINE, RANDOM    CBC/DIFF    THYROID STIMULATING HORMONE (SENSITIVE TSH)    HGA1C (HEMOGLOBIN A1C WITH EST AVG GLUCOSE)    LIPID PANEL    CBC WITH DIFF        PLAN: Treatment per orders . Call or return to clinic prn if these symptoms worsen or fail to improve as anticipated.  Meds reviewed as well as labs.  Chart reviewed and updated.   Continue current treatment.  Keep follow-up appointment.     Patient is to see Nephrology on February 18 we will repeat blood work currently and may have to move that appointment up the stairs then a drastic change  in his labs however I think there will be an improvement as he has finished the antibiotics and acute changes are improved ER records were again reviewed  Toe was cleaned with normal saline triple antibiotic was applied as well as a Telfa dressing.  Tobacco cessation encouraged  Chronic medical problems addressed questions were answered  Samuella Cota, PA-C  On the day of the encounter, a total of  30 minutes was spent on this patient encounter including review of historical information, examination, documentation and post-visit activities.

## 2023-06-15 ENCOUNTER — Other Ambulatory Visit (INDEPENDENT_AMBULATORY_CARE_PROVIDER_SITE_OTHER): Payer: Self-pay | Admitting: Internal Medicine

## 2023-06-16 ENCOUNTER — Ambulatory Visit (INDEPENDENT_AMBULATORY_CARE_PROVIDER_SITE_OTHER): Payer: Self-pay | Admitting: Internal Medicine

## 2023-06-23 ENCOUNTER — Other Ambulatory Visit (INDEPENDENT_AMBULATORY_CARE_PROVIDER_SITE_OTHER): Payer: Self-pay | Admitting: Internal Medicine

## 2023-06-23 MED ORDER — ERGOCALCIFEROL (VITAMIN D2) 1,250 MCG (50,000 UNIT) CAPSULE
50000.0000 [IU] | ORAL_CAPSULE | ORAL | 3 refills | Status: DC
Start: 2023-06-23 — End: 2023-11-18

## 2023-07-02 ENCOUNTER — Other Ambulatory Visit (INDEPENDENT_AMBULATORY_CARE_PROVIDER_SITE_OTHER): Payer: Self-pay | Admitting: Internal Medicine

## 2023-07-10 ENCOUNTER — Other Ambulatory Visit (INDEPENDENT_AMBULATORY_CARE_PROVIDER_SITE_OTHER): Payer: Self-pay | Admitting: Internal Medicine

## 2023-07-20 ENCOUNTER — Other Ambulatory Visit (INDEPENDENT_AMBULATORY_CARE_PROVIDER_SITE_OTHER): Payer: Self-pay | Admitting: Internal Medicine

## 2023-08-04 ENCOUNTER — Other Ambulatory Visit: Payer: Self-pay

## 2023-08-04 ENCOUNTER — Other Ambulatory Visit (INDEPENDENT_AMBULATORY_CARE_PROVIDER_SITE_OTHER): Payer: Self-pay | Admitting: Internal Medicine

## 2023-08-07 ENCOUNTER — Ambulatory Visit: Payer: Self-pay | Admitting: Internal Medicine

## 2023-08-12 ENCOUNTER — Other Ambulatory Visit (INDEPENDENT_AMBULATORY_CARE_PROVIDER_SITE_OTHER): Payer: Self-pay | Admitting: Internal Medicine

## 2023-08-13 ENCOUNTER — Ambulatory Visit (INDEPENDENT_AMBULATORY_CARE_PROVIDER_SITE_OTHER): Payer: Self-pay | Admitting: Internal Medicine

## 2023-08-20 ENCOUNTER — Other Ambulatory Visit (INDEPENDENT_AMBULATORY_CARE_PROVIDER_SITE_OTHER): Payer: Self-pay | Admitting: Internal Medicine

## 2023-08-26 ENCOUNTER — Encounter (HOSPITAL_BASED_OUTPATIENT_CLINIC_OR_DEPARTMENT_OTHER): Payer: Self-pay

## 2023-08-26 ENCOUNTER — Emergency Department
Admission: EM | Admit: 2023-08-26 | Discharge: 2023-08-26 | Disposition: A | Discharge status: Home / Self Care | Attending: Emergency Medicine | Admitting: Emergency Medicine

## 2023-08-26 ENCOUNTER — Other Ambulatory Visit: Payer: Self-pay

## 2023-08-26 DIAGNOSIS — E162 Hypoglycemia, unspecified: Secondary | ICD-10-CM | POA: Insufficient documentation

## 2023-08-26 DIAGNOSIS — Z794 Long term (current) use of insulin: Secondary | ICD-10-CM | POA: Insufficient documentation

## 2023-08-26 DIAGNOSIS — S50311A Abrasion of right elbow, initial encounter: Secondary | ICD-10-CM | POA: Insufficient documentation

## 2023-08-26 DIAGNOSIS — Y92512 Supermarket, store or market as the place of occurrence of the external cause: Secondary | ICD-10-CM

## 2023-08-26 DIAGNOSIS — W1789XA Other fall from one level to another, initial encounter: Secondary | ICD-10-CM

## 2023-08-26 DIAGNOSIS — R4182 Altered mental status, unspecified: Secondary | ICD-10-CM | POA: Insufficient documentation

## 2023-08-26 DIAGNOSIS — Y92481 Parking lot as the place of occurrence of the external cause: Secondary | ICD-10-CM | POA: Insufficient documentation

## 2023-08-26 HISTORY — DX: Type 2 diabetes mellitus without complications: E11.9

## 2023-08-26 LAB — COMPREHENSIVE METABOLIC PANEL, NON-FASTING
ALBUMIN/GLOBULIN RATIO: 1.1 (ref 0.8–1.4)
ALBUMIN: 3.8 g/dL (ref 3.4–5.0)
ALKALINE PHOSPHATASE: 51 U/L (ref 46–116)
ALT (SGPT): 40 U/L (ref <=78)
ANION GAP: 6 mmol/L (ref 4–13)
AST (SGOT): 20 U/L (ref 15–37)
BILIRUBIN TOTAL: 0.4 mg/dL (ref 0.2–1.0)
BUN/CREA RATIO: 21
BUN: 59 mg/dL — ABNORMAL HIGH (ref 7–18)
CALCIUM, CORRECTED: 9.7 mg/dL
CALCIUM: 9.5 mg/dL (ref 8.5–10.1)
CHLORIDE: 103 mmol/L (ref 98–107)
CO2 TOTAL: 35 mmol/L — ABNORMAL HIGH (ref 21–32)
CREATININE: 2.75 mg/dL — ABNORMAL HIGH (ref 0.70–1.30)
ESTIMATED GFR: 26 mL/min/{1.73_m2} — ABNORMAL LOW (ref >59)
GLOBULIN: 3.4
GLUCOSE: 63 mg/dL — ABNORMAL LOW (ref 74–106)
OSMOLALITY, CALCULATED: 302 mosm/kg — ABNORMAL HIGH (ref 270–290)
POTASSIUM: 3.8 mmol/L (ref 3.5–5.1)
PROTEIN TOTAL: 7.2 g/dL (ref 6.4–8.2)
SODIUM: 144 mmol/L (ref 136–145)

## 2023-08-26 LAB — URINALYSIS, MACRO/MICRO
BILIRUBIN: NEGATIVE mg/dL
BLOOD: NEGATIVE mg/dL
GLUCOSE: NEGATIVE mg/dL
KETONES: NEGATIVE mg/dL
LEUKOCYTES: NEGATIVE WBCs/uL
NITRITE: NEGATIVE
PH: 6 (ref 4.6–8.0)
SPECIFIC GRAVITY: 1.01 (ref 1.003–1.035)
UROBILINOGEN: 0.2 mg/dL (ref 0.2–1.0)

## 2023-08-26 LAB — CBC WITH DIFF
HCT: 31.6 % — ABNORMAL LOW (ref 36.7–47.1)
HGB: 10 g/dL — ABNORMAL LOW (ref 12.5–16.3)
MCH: 30.2 pg (ref 23.8–33.4)
MCHC: 31.6 g/dL — ABNORMAL LOW (ref 32.5–36.3)
MCV: 95.5 fL (ref 73.0–96.2)
MPV: 8.4 fL (ref 7.4–11.4)
PLATELETS: 234 10*3/uL (ref 140–440)
RBC: 3.31 10*6/uL — ABNORMAL LOW (ref 4.06–5.63)
RDW: 15.9 % (ref 12.1–16.2)
WBC: 8.1 10*3/uL (ref 3.6–10.2)

## 2023-08-26 LAB — SCAN DIFFERENTIAL
PLATELET MORPHOLOGY COMMENT: NORMAL
RBC MORPHOLOGY COMMENT: NORMAL

## 2023-08-26 LAB — URINALYSIS, MICROSCOPIC

## 2023-08-26 LAB — POC BLOOD GLUCOSE (RESULTS)
GLUCOSE, POC: 85 mg/dL (ref 70–100)
GLUCOSE, POC: 121 mg/dL — ABNORMAL HIGH (ref 70–100)

## 2023-08-26 NOTE — ED Nurses Note (Signed)

## 2023-08-26 NOTE — ED Provider Notes (Signed)
 [facility] - Emergency Department  ED Primary Provider Note  History of Present Illness   Chief Complaint   Patient presents with    Low Blood Sugar     Ems reports pt was [employer], fell getting off the transit bus. Abrasion rt elbow. Ems reports they found pt responsive but blood sugar was 43. Pt told ems he slid down the pole, denies other injury. Iv 22 ga rt inner wrist, D10 150 ml in route. Pt also ate a candy bar on scene per police dept. Pt awake on arrival to ER     Martin Porter is a 58 y.o. male who had concerns including Low Blood Sugar.  Arrival: The patient arrived by Ambulance complaining not eating this morning because he had a doctor's appointment.  He then went to [employer] passed out on the ground.  When EMS arrived they found his blood sugar 43.  Patient was given a piece of candy by police.  His present blood sugars 85.  He denies any pain anywhere.  He is looking to go home.  Patient states he took his insulin  this morning without eating.      HPI  Review of Systems   Review of Systems   Constitutional:  Positive for activity change and appetite change. Negative for chills and fever.   HENT:  Negative for ear pain and sore throat.    Eyes:  Negative for pain and visual disturbance.   Respiratory:  Negative for cough and shortness of breath.    Cardiovascular:  Negative for chest pain and palpitations.   Gastrointestinal:  Negative for abdominal pain and vomiting.   Genitourinary:  Negative for dysuria and hematuria.   Musculoskeletal:  Negative for arthralgias and back pain.   Skin:  Negative for color change and rash.   Neurological:  Negative for seizures and syncope.   Psychiatric/Behavioral:  Positive for behavioral problems, confusion and decreased concentration.    All other systems reviewed and are negative.     Historical Data   History Reviewed This Encounter:     Physical Exam   ED Triage Vitals [08/26/23 1029]   BP (Non-Invasive) (!) 155/78   Heart  Rate 70   Respiratory Rate 16   Temperature (!) 35.7 C (96.3 F)   SpO2 93 %   Weight (!) 139 kg (306 lb)   Height 1.829 m (6')     Physical Exam  Vitals and nursing note reviewed.   Constitutional:       General: He is not in acute distress.     Appearance: Normal appearance. He is well-developed and normal weight.   HENT:      Head: Normocephalic and atraumatic.      Right Ear: External ear normal.      Left Ear: External ear normal.      Nose: Nose normal.      Mouth/Throat:      Mouth: Mucous membranes are dry.   Eyes:      Extraocular Movements: Extraocular movements intact.      Conjunctiva/sclera: Conjunctivae normal.      Pupils: Pupils are equal, round, and reactive to light.   Cardiovascular:      Rate and Rhythm: Normal rate and regular rhythm.      Pulses: Normal pulses.      Heart sounds: Normal heart sounds. No murmur heard.  Pulmonary:      Effort: Pulmonary effort is normal. No respiratory distress.  Breath sounds: Normal breath sounds.   Abdominal:      General: Bowel sounds are normal.      Palpations: Abdomen is soft.      Tenderness: There is no abdominal tenderness.   Musculoskeletal:         General: No swelling. Normal range of motion.      Cervical back: Normal range of motion and neck supple.   Skin:     General: Skin is warm and dry.      Capillary Refill: Capillary refill takes less than 2 seconds.   Neurological:      General: No focal deficit present.      Mental Status: He is alert and oriented to person, place, and time.   Psychiatric:         Mood and Affect: Mood normal.         Behavior: Behavior normal.         Thought Content: Thought content normal.       Patient Data     Labs Ordered/Reviewed   COMPREHENSIVE METABOLIC PANEL, NON-FASTING - Abnormal; Notable for the following components:       Result Value    CO2 TOTAL 35 (*)     BUN 59 (*)     CREATININE 2.75 (*)     ESTIMATED GFR 26 (*)     GLUCOSE 63 (*)     OSMOLALITY, CALCULATED 302 (*)     All other components within  normal limits    Narrative:     Estimated Glomerular Filtration Rate (eGFR) is calculated using the CKD-EPI (2021) equation, intended for patients 54 years of age and older. If gender is not documented or "unknown", there will be no eGFR calculation.   CBC WITH DIFF - Abnormal; Notable for the following components:    RBC 3.31 (*)     HGB 10.0 (*)     HCT 31.6 (*)     MCHC 31.6 (*)     All other components within normal limits   URINALYSIS, MACRO/MICRO - Abnormal; Notable for the following components:    PROTEIN Trace (*)     All other components within normal limits   URINALYSIS, MICROSCOPIC - Abnormal; Notable for the following components:    BACTERIA Few (*)     All other components within normal limits   POC BLOOD GLUCOSE (RESULTS) - Normal   CBC/DIFF    Narrative:     The following orders were created for panel order CBC/DIFF.  Procedure                               Abnormality         Status                     ---------                               -----------         ------                     CBC WITH DIFF[[id number]]                Abnormal            Final result  Please view results for these tests on the individual orders.   URINALYSIS WITH REFLEX MICROSCOPIC AND CULTURE IF POSITIVE    Narrative:     The following orders were created for panel order URINALYSIS WITH REFLEX MICROSCOPIC AND CULTURE IF POSITIVE.  Procedure                               Abnormality         Status                     ---------                               -----------         ------                     URINALYSIS, MACRO/MICRO[[id number]]      Abnormal            Final result               URINALYSIS, MICROSCOPIC[[id number]]      Abnormal            Final result                 Please view results for these tests on the individual orders.   SCAN DIFFERENTIAL     No orders to display     Medical Decision Making        Medical Decision Making  Patient is 58 year old white male complaining not eating this morning  and then going to PMD is office.  He then went to [employer] where he passed out on the ground.  Patient's blood sugar at times 43.  He was given candy by police.  Patient states he took his insulin  this morning without eating.  Patient had a sandwich here in the ED and his blood sugar came up to 85.  Patient will be given another meal await blood work.  Patient will be treated for results and then possibly discharged home.  He will follow up with his PMD in the next 2-3 days.    Amount and/or Complexity of Data Reviewed  Labs: ordered.                Clinical Impression   Altered mental status, unspecified altered mental status type (Primary)   Hypoglycemia       Disposition: Discharged               Clinical Impression   Altered mental status, unspecified altered mental status type (Primary)   Hypoglycemia       Current Discharge Medication List

## 2023-08-26 NOTE — ED Nurses Note (Signed)
 Patient provided with sandwich tray and drinks at bedside. Patient denies any further needs or complaints at this time. Plan of care ongoing.

## 2023-08-31 ENCOUNTER — Other Ambulatory Visit: Payer: Self-pay

## 2023-08-31 ENCOUNTER — Encounter (INDEPENDENT_AMBULATORY_CARE_PROVIDER_SITE_OTHER): Payer: Self-pay | Admitting: Internal Medicine

## 2023-08-31 ENCOUNTER — Ambulatory Visit (INDEPENDENT_AMBULATORY_CARE_PROVIDER_SITE_OTHER): Payer: Self-pay | Admitting: Internal Medicine

## 2023-08-31 ENCOUNTER — Ambulatory Visit: Attending: Internal Medicine | Admitting: Internal Medicine

## 2023-08-31 VITALS — BP 120/60 | HR 78 | Ht 72.0 in | Wt 322.4 lb

## 2023-08-31 DIAGNOSIS — Z794 Long term (current) use of insulin: Secondary | ICD-10-CM | POA: Insufficient documentation

## 2023-08-31 DIAGNOSIS — N179 Acute kidney failure, unspecified: Secondary | ICD-10-CM | POA: Insufficient documentation

## 2023-08-31 DIAGNOSIS — Z09 Encounter for follow-up examination after completed treatment for conditions other than malignant neoplasm: Secondary | ICD-10-CM | POA: Insufficient documentation

## 2023-08-31 DIAGNOSIS — N184 Chronic kidney disease, stage 4 (severe): Secondary | ICD-10-CM | POA: Insufficient documentation

## 2023-08-31 DIAGNOSIS — I480 Paroxysmal atrial fibrillation: Secondary | ICD-10-CM | POA: Insufficient documentation

## 2023-08-31 DIAGNOSIS — E162 Hypoglycemia, unspecified: Secondary | ICD-10-CM | POA: Insufficient documentation

## 2023-08-31 DIAGNOSIS — E119 Type 2 diabetes mellitus without complications: Secondary | ICD-10-CM | POA: Insufficient documentation

## 2023-08-31 LAB — HGA1C (HEMOGLOBIN A1C WITH EST AVG GLUCOSE): HEMOGLOBIN A1C: 6 % (ref 4.0–6.0)

## 2023-08-31 LAB — MICROALBUMIN/CREATININE RATIO, URINE, RANDOM
CREATININE RANDOM URINE: 66 mg/dL (ref 30–125)
MICROALBUMIN RANDOM URINE: 19.8 mg/dL
MICROALBUMIN/CREATININE RATIO RANDOM URINE: 300 mg/g

## 2023-08-31 LAB — COMPREHENSIVE METABOLIC PANEL, NON-FASTING
ALBUMIN/GLOBULIN RATIO: 1.5 — ABNORMAL HIGH (ref 0.8–1.4)
ALBUMIN: 4 g/dL (ref 3.5–5.7)
ALKALINE PHOSPHATASE: 40 U/L (ref 34–104)
ALT (SGPT): 25 U/L (ref 7–52)
ANION GAP: 8 mmol/L (ref 4–13)
AST (SGOT): 22 U/L (ref 13–39)
BILIRUBIN TOTAL: 0.4 mg/dL (ref 0.3–1.0)
BUN/CREA RATIO: 20 (ref 6–22)
BUN: 60 mg/dL — ABNORMAL HIGH (ref 7–25)
CALCIUM, CORRECTED: 9.9 mg/dL (ref 8.9–10.8)
CALCIUM: 9.9 mg/dL (ref 8.6–10.3)
CHLORIDE: 101 mmol/L (ref 98–107)
CO2 TOTAL: 32 mmol/L — ABNORMAL HIGH (ref 21–31)
CREATININE: 3.04 mg/dL — ABNORMAL HIGH (ref 0.60–1.30)
ESTIMATED GFR: 23 mL/min/{1.73_m2} — ABNORMAL LOW (ref >59)
GLOBULIN: 2.7 (ref 2.0–3.5)
GLUCOSE: 124 mg/dL — ABNORMAL HIGH (ref 74–109)
OSMOLALITY, CALCULATED: 300 mosm/kg — ABNORMAL HIGH (ref 270–290)
POTASSIUM: 4.2 mmol/L (ref 3.5–5.1)
PROTEIN TOTAL: 6.7 g/dL (ref 6.4–8.9)
SODIUM: 141 mmol/L (ref 136–145)

## 2023-08-31 MED ORDER — EMPAGLIFLOZIN 10 MG TABLET
10.0000 mg | ORAL_TABLET | Freq: Every day | ORAL | 2 refills | Status: DC
Start: 2023-08-31 — End: 2023-10-02

## 2023-08-31 NOTE — Progress Notes (Signed)
 [facility]  [address]  [place] [address]  Operated by [facility]  Transitional Care Management Note    Name: Martin Porter MRN:  [id number]   Date: 08/31/2023 Age: 58 y.o.     Chief Complaint: ED Follow-up (Was in ed for low blood sugars) and Hospital Discharge Transition       SUBJECTIVE:  Martin Porter is a 58 y.o. male presenting today for follow-up after being discharged. The main problem requiring admission was low BS  and alterned mental status. Pt was seeing nephrology and he was fasting  and on bus and was  confused  and  found to have low BS   43     on his monitor and  rescue sq[facility] called when he was on the bus  someone gave him  [place] sandwich and it came up to  90.  Patient said he had food but it is still down in chair and he left it at the Nephrology office.    We are going to patient's monitor he has had for lows the last 7 days 14 days there were 6 over 30 days 19  events and a total of 47 events in 90 days for low sugars patient is only 53% in target over the 90 days   Patient's A1c runs excellent will back off insulin  and readjust   Patient asked about being on a GLP ones gets weight loss as well as  better sugar control I told him it has to be a reason why we have not done thus far and as we discussed all the reasons that it is contraindicated his was history of pancreatitis.     Patient understands he is not supposed to take this we meal insulin  if he does not eat and he did do that     Patient is on NovoLog  25 units pre meal will drop that to 15 units pre meal changes Lantus  to 35 units a.m. 50 units p.m. and continue to monitor and time in target     ER records reviewed    Discussed medications for DM    Pt had pancreatitis 2014    OBJECTIVE:   BP (!) 160/100 (Site: Left Arm, Patient Position: Sitting)   Pulse 78   Ht 1.829 m (6')   Wt (!) 146 kg (322 lb 6.4 oz)   SpO2 93%   BMI 43.73 kg/m      Exam-AMB  Const   patient has limited  vision  General: cooperative, obese and no acute distress  Orientation/Consciousness: patient oriented x3  HENMT  Ears: hearing grossly normal bilaterally  Eyes  General: appearance normal, both eyes and all related structures  Conjunctivae: conjunctivae normal  Sclera: sclerae normal  EOM: EOM intact bilaterally  Neck  Neck: normal visual inspection and no lymphadenopathy  Thyroid : thyroid  normal  Carotids: no bruits  Resp  Effort & Inspection: normal respiratory effort  Auscultation: clear to auscultation bilaterally, no crackles, no rales, no rhonchi and no wheezes  Cardio  Jugular venous pressure: no JVD  Rate: regular rate  Rhythm: regular rhythm  Heart Sounds: S1 normal, S2 normal, no click, no murmurs and no rubs  Bruits: no carotid bruits  GI  Inspection: Yes normal to inspection  Palpation: soft, no hepatosplenomegaly, no guarding, no masses and nontender  Auscultation: normal bowel sounds  Neuro  General: patient oriented x3  Extrem  General: normal to inspection, normal exam except as noted, clubbing,  cyanosis or edema noted, normal gait and other  Psych  Mental Status: mental status grossly normal  Mood: congruent mood  Affect: normal affect  Insight: insight good  Judgment: judgment good       Transition of Care Contact Information  Discharge date: Discharge Date: Not Found  Transition Facility Type--Hospital (Inpatient or Observation)  Facility Name--[facility] Interactive Contact(s):  Clinical Staff Name/Role who contacted--Martin Charma Coon, RN TCM     Data Reviewed  Medication Reconciliation completed    Assessment & Plan  Hypoglycemia    Type 2 diabetes mellitus without complication, with long-term current use of insulin     Paroxysmal atrial fibrillation (CMS HCC)    AKI (acute kidney injury) (CMS HCC)    CKD (chronic kidney disease) stage 4, GFR 15-29 ml/min (CMS HCC)    Type 2 diabetes mellitus without complication, unspecified whether long term insulin  use       Other transition  actions (Optional) -: Discharge documentation was reviewed and Durable medical equipment is not needed.    Plan    Premeal Novolog  to  15 units   do not  take insulin  unless planning on eating    pt was taking  25 units    Pt called  Dr  Hendricks Locker and ok for jardiance  or farxiga   Jardiance  10 mg  one daily   Due to hx of pancreatitis   no rybelsus or mounjaro  as pt would like one to help loose wt    Labs in fu     Er record  reviewed       Follow-up in 2-3 weeks patient has constant glucose monitoring   ER records were reviewed          Tia Flowers, PA-C

## 2023-09-01 ENCOUNTER — Other Ambulatory Visit (INDEPENDENT_AMBULATORY_CARE_PROVIDER_SITE_OTHER): Payer: Self-pay | Admitting: Internal Medicine

## 2023-09-01 MED ORDER — NEOMYCIN-BACITRACN ZN-POLYMYX 3.5 MG-400 UNIT-5,000 UNIT/GRAM TOP OINT
TOPICAL_OINTMENT | Freq: Two times a day (BID) | CUTANEOUS | 1 refills | Status: DC | PRN
Start: 2023-09-01 — End: 2023-12-07

## 2023-09-30 ENCOUNTER — Other Ambulatory Visit (INDEPENDENT_AMBULATORY_CARE_PROVIDER_SITE_OTHER): Payer: Self-pay | Admitting: Internal Medicine

## 2023-10-02 ENCOUNTER — Ambulatory Visit: Payer: Self-pay | Attending: Internal Medicine | Admitting: Internal Medicine

## 2023-10-02 ENCOUNTER — Encounter (INDEPENDENT_AMBULATORY_CARE_PROVIDER_SITE_OTHER): Payer: Self-pay | Admitting: Internal Medicine

## 2023-10-02 ENCOUNTER — Other Ambulatory Visit: Payer: Self-pay

## 2023-10-02 VITALS — BP 150/90 | HR 80 | Ht 72.0 in | Wt 311.6 lb

## 2023-10-02 DIAGNOSIS — Z940 Kidney transplant status: Secondary | ICD-10-CM | POA: Insufficient documentation

## 2023-10-02 DIAGNOSIS — E119 Type 2 diabetes mellitus without complications: Secondary | ICD-10-CM | POA: Insufficient documentation

## 2023-10-02 DIAGNOSIS — N184 Chronic kidney disease, stage 4 (severe): Secondary | ICD-10-CM | POA: Insufficient documentation

## 2023-10-02 DIAGNOSIS — Z794 Long term (current) use of insulin: Secondary | ICD-10-CM | POA: Insufficient documentation

## 2023-10-02 DIAGNOSIS — E782 Mixed hyperlipidemia: Secondary | ICD-10-CM | POA: Insufficient documentation

## 2023-10-02 DIAGNOSIS — I480 Paroxysmal atrial fibrillation: Secondary | ICD-10-CM | POA: Insufficient documentation

## 2023-10-02 DIAGNOSIS — N1831 Chronic kidney disease, stage 3a: Secondary | ICD-10-CM

## 2023-10-02 LAB — COMPREHENSIVE METABOLIC PANEL, NON-FASTING
ALBUMIN/GLOBULIN RATIO: 1.6 — ABNORMAL HIGH (ref 0.8–1.4)
ALBUMIN: 4.2 g/dL (ref 3.5–5.7)
ALKALINE PHOSPHATASE: 35 U/L (ref 34–104)
ALT (SGPT): 20 U/L (ref 7–52)
ANION GAP: 10 mmol/L (ref 4–13)
AST (SGOT): 19 U/L (ref 13–39)
BILIRUBIN TOTAL: 0.4 mg/dL (ref 0.3–1.0)
BUN/CREA RATIO: 20 (ref 6–22)
BUN: 76 mg/dL — ABNORMAL HIGH (ref 7–25)
CALCIUM, CORRECTED: 9.3 mg/dL (ref 8.9–10.8)
CALCIUM: 9.5 mg/dL (ref 8.6–10.3)
CHLORIDE: 98 mmol/L (ref 98–107)
CO2 TOTAL: 31 mmol/L (ref 21–31)
CREATININE: 3.87 mg/dL — ABNORMAL HIGH (ref 0.60–1.30)
ESTIMATED GFR: 17 mL/min/{1.73_m2} — ABNORMAL LOW (ref >59)
GLOBULIN: 2.7 (ref 2.0–3.5)
GLUCOSE: 76 mg/dL (ref 74–109)
OSMOLALITY, CALCULATED: 299 mosm/kg — ABNORMAL HIGH (ref 270–290)
POTASSIUM: 3.9 mmol/L (ref 3.5–5.1)
PROTEIN TOTAL: 6.9 g/dL (ref 6.4–8.9)
SODIUM: 139 mmol/L (ref 136–145)

## 2023-10-02 LAB — MAGNESIUM: MAGNESIUM: 2.3 mg/dL (ref 1.9–2.7)

## 2023-10-02 MED ORDER — EMPAGLIFLOZIN 10 MG TABLET
10.0000 mg | ORAL_TABLET | Freq: Every day | ORAL | 2 refills | Status: DC
Start: 2023-10-02 — End: 2024-02-15

## 2023-10-02 MED ORDER — HYDROCHLOROTHIAZIDE 25 MG TABLET
25.0000 mg | ORAL_TABLET | Freq: Every day | ORAL | 3 refills | Status: DC
Start: 2023-10-02 — End: 2023-12-07

## 2023-10-02 MED ORDER — GABAPENTIN 800 MG TABLET
800.0000 mg | ORAL_TABLET | Freq: Three times a day (TID) | ORAL | 2 refills | Status: DC
Start: 2023-10-02 — End: 2023-12-07

## 2023-10-02 MED ORDER — CREON 36,000 UNIT-114,000 UNIT-180,000 UNIT CAPSULE,DELAYED RELEASE
2.0000 | DELAYED_RELEASE_CAPSULE | Freq: Three times a day (TID) | ORAL | 3 refills | Status: DC
Start: 2023-10-02 — End: 2024-01-22

## 2023-10-02 MED ORDER — AMLODIPINE 5 MG TABLET
5.0000 mg | ORAL_TABLET | Freq: Two times a day (BID) | ORAL | 4 refills | Status: DC
Start: 2023-10-02 — End: 2023-12-07

## 2023-10-02 MED ORDER — ALLOPURINOL 100 MG TABLET
100.0000 mg | ORAL_TABLET | Freq: Every day | ORAL | 2 refills | Status: DC
Start: 2023-10-02 — End: 2024-02-15

## 2023-10-02 MED ORDER — INSULIN ASPART (U-100) 100 UNIT/ML (3 ML) SUBCUTANEOUS PEN
PEN_INJECTOR | SUBCUTANEOUS | 0 refills | Status: DC
Start: 2023-10-02 — End: 2024-01-11

## 2023-10-02 MED ORDER — ATENOLOL 50 MG TABLET
50.0000 mg | ORAL_TABLET | Freq: Two times a day (BID) | ORAL | 4 refills | Status: DC
Start: 2023-10-02 — End: 2024-02-15

## 2023-10-02 MED ORDER — CARVEDILOL 25 MG TABLET
25.0000 mg | ORAL_TABLET | Freq: Two times a day (BID) | ORAL | 4 refills | Status: DC
Start: 2023-10-02 — End: 2024-02-15

## 2023-10-02 MED ORDER — FENOFIBRATE 160 MG TABLET
160.0000 mg | ORAL_TABLET | Freq: Every day | ORAL | 4 refills | Status: DC
Start: 2023-10-02 — End: 2024-02-15

## 2023-10-02 MED ORDER — FUROSEMIDE 20 MG TABLET
20.0000 mg | ORAL_TABLET | Freq: Every day | ORAL | 0 refills | Status: DC
Start: 2023-10-02 — End: 2023-10-08

## 2023-10-02 MED ORDER — FAMOTIDINE 40 MG TABLET
40.0000 mg | ORAL_TABLET | Freq: Two times a day (BID) | ORAL | 4 refills | Status: DC
Start: 2023-10-02 — End: 2024-02-15

## 2023-10-02 MED ORDER — CLONIDINE HCL 0.1 MG TABLET
0.1000 mg | ORAL_TABLET | Freq: Two times a day (BID) | ORAL | 3 refills | Status: DC
Start: 2023-10-02 — End: 2023-12-07

## 2023-10-02 NOTE — Progress Notes (Signed)
 INTERNAL MEDICINE, [facility]  [address]  [place] [address]  Operated by [facility]      Name: Martin Porter                       Date of Birth: 21-Jul-1965   MRN:  [id number]                         Date of visit: 10/02/2023     PCP: Tia Flowers, PA-C     Subjective  Martin Porter is a 58 y.o. year old male who presents for Diabetes (Follow up on blood sugars)   to clinic.  No specialty comments available.   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 12/17 MILD, WILL FOLLOW UP WITH SLEEP MED FOR CPAP MAY  BE BECAUSE FATIGUE      Candida infection 07/15/2021      Current Outpatient Medications   Medication Sig    acetaminophen  (TYLENOL ) 500 mg Oral Tablet Take 1 Tablet (500 mg total) by mouth Every 4 hours as needed for Pain    allopurinoL  (ZYLOPRIM ) 100 mg Oral Tablet Take 1 Tablet (100 mg total) by mouth Daily    amLODIPine  (NORVASC ) 5 mg Oral Tablet Take 1  Tablet (5 mg total) by mouth Twice daily    aspirin (ECOTRIN) 81 mg Oral Tablet, Delayed Release (E.C.) Take 1 Tablet (81 mg total) by mouth    atenoloL  (TENORMIN ) 50 mg Oral Tablet Take 1 Tablet (50 mg total) by mouth Twice daily    atropine  (ISOPTO ATROPINE ) 1 % Ophthalmic Drops 1 Drop Four times a day    carvediloL  (COREG ) 25 mg Oral Tablet Take 1 Tablet (25 mg total) by mouth Twice daily Take with food.    cholecalciferol , vitamin D3, 1,250 mcg (50,000 unit) Oral Capsule TAKE 1 CAPSULE BY MOUTH ONCE WEEKLY    cloNIDine  HCL (CATAPRES ) 0.1 mg Oral Tablet Take 1 Tablet (0.1 mg total) by mouth Twice daily    cyanocobalamin (VITAMIN B 12) 1,000 mcg Oral Tablet Take 2 Tablets (2,000 mcg total) by mouth (Patient not taking: Reported on 10/02/2023)    docusate sodium (COLACE) 100 mg Oral Capsule Take 1 Capsule (100 mg total) by mouth    dorzolamide -timoloL  (COSOPT ) 22.3-6.8 mg/mL Ophthalmic Drops Administer 1 Drop into the left eye Twice daily    empagliflozin  (  JARDIANCE ) 10 mg Oral Tablet Take 1 Tablet (10 mg total) by mouth Daily    ergocalciferol , vitamin D2, (DRISDOL ) 1,250 mcg (50,000 unit) Oral Capsule Take 1 Capsule (50,000 Units total) by mouth Every 7 days    famotidine  (PEPCID ) 40 mg Oral Tablet Take 1 Tablet (40 mg total) by mouth Twice daily    fenofibrate  (LOFIBRA) 160 mg Oral Tablet Take 1 Tablet (160 mg total) by mouth Daily    furosemide  (LASIX ) 20 mg Oral Tablet Take 1 Tablet (20 mg total) by mouth Daily    gabapentin  (NEURONTIN ) 800 mg Oral Tablet Take 1 Tablet (800 mg total) by mouth Three times a day    hydroCHLOROthiazide  (HYDRODIURIL ) 25 mg Oral Tablet Take 1 Tablet (25 mg total) by mouth Daily    insulin  aspart U-100 (NOVOLOG  FLEXPEN U-100 INSULIN ) 100 unit/mL (3 mL) Subcutaneous Insulin  Pen Taking 80-85 units daily    insulin  glargine (LANTUS  SOLOSTAR U-100 INSULIN ) 100 unit/mL Subcutaneous Insulin  Pen INJECT 95 UNITS UNDER THE SKIN AT BEDTIME    iron  ps complex-B12-folic acid  (POLY-IRON  150  FORTE) 150-25-1 mg-mcg-mg Oral Capsule TAKE TWO CAPSULES BY MOUTH EVERY DAY    magnesium  oxide (MAG-OX) 400 mg Oral Tablet TAKE ONE TABLET BY MOUTH TWICE A DAY    multivitamin with iron  Oral Tablet Take 1 Tablet by mouth Daily    mupirocin  (BACTROBAN ) 2 % Ointment Apply topically Three times a day (Patient not taking: Reported on 08/31/2023)    mycophenolate  sodium (MYFORTIC ) 180 mg Oral Tablet, Delayed Release (E.C.) Take 3 Tablets (540 mg total) by mouth Twice daily    neomycin -bacitracin-polymyxin (TRIPLE ANTIBIOTIC) 3.5mg -400 unit- 5,000 unit/gram Ointment Apply topically Twice per day as needed (Patient not taking: Reported on 10/02/2023)    nystatin  (MYCOSTATIN ) 100,000 unit/gram Cream Apply topically Twice daily    pancreatic enzyme replacement (CREON ) 36,000-114,000- 180,000 unit Oral Capsule, Delayed Release(E.C.) Take 2 Capsules (72,000 units of lipase  total) by mouth Three times daily with meals for 30 days    pantoprazole  (PROTONIX ) 40 mg Oral Tablet, Delayed Release (E.C.) TAKE ONE TABLET BY MOUTH TWICE A DAY    Pen Needle, Disposable, (BD UF MINI PEN NEEDLE) 31 gauge x 3/16" Needle USE 5 TIMES A DAY    potassium citrate  (UROCIT-K ) 10 mEq (1,080 mg) Oral Tablet Sustained Release Take 1 Tablet (10 mEq total) by mouth Twice daily with food (Patient not taking: Reported on 10/02/2023)    pravastatin  (PRAVACHOL ) 20 mg Oral Tablet Take 1 Tablet (20 mg total) by mouth Every night    prednisoLONE  acetate (PRED FORTE ) 1 % Ophthalmic Drops, Suspension Instill 1 Drop into both eyes Twice daily    promethazine -dextromethorphan (PHENERGAN -DM) 6.25-15 mg/5 mL Oral Syrup Take 5 mL by mouth Four times a day as needed for Cough (Patient not taking: Reported on 06/05/2023)    SSD 1 % Cream APPLY AS DIRECTED ONCE A DAY TO HEEL AND ANKLE (Patient not taking: Reported on 10/02/2023)    tacrolimus  (PROGRAF ) 1 mg Oral Capsule TAKE THREE CAPSULES BY MOUTH TWICE A DAY FOR KIDNEY TRANSPLANT (Patient not taking: Reported on  10/02/2023)    testosterone  (ANDROGEL ) 20.25 mg/1.25 gram (1.62 %) Transdermal Gel in Metered-dose Pump Place 20.25 mg on the skin Once a day    traZODone  (DESYREL ) 50 mg Oral Tablet TAKE ONE TABLET BY MOUTH EVERY NIGHT    trimethoprim -sulfamethoxazole  (BACTRIM ) 80-400mg  per tablet TAKE 1 TABLET 3 TIMES A WEEK (Patient not taking: Reported on 10/02/2023)    vitamin E 400 unit Oral  Capsule Take 1 Capsule (400 Units total) by mouth Once a day        Chronic Disease Management-AMB  Fu visit    This pt  is for  fu today  of chronic problems  fu BS     Patient says he feels good today less low blood sugars       FU basal cell back  and   s/p upper lip  doing radiation   16 total  treatments        Better but still with dehydration     Co insomnia  pt  says  he can't sleep well  pt  says nothing is bothering him   he takes Neurontin  at  8 pm  we discussed  moving the time    Somewhat improved with  trazodone      fu ckd  3  sees dr Hendricks Locker  fu  1 year fu    hx of  transplant  dec   2005     and  [place]    Appts  utd         needs fu with  dr Hendricks Locker      abdominal pain  9/18 EGD Dr. Llana Rile, antral ulcer 2016 nl HIDA 2017 nl abd ultrasound x for FLD    Anemia   fu  labs   h/h stable      atrial fibrillation  pt reports that took coumadin after transplant and recalls 2 yrs of heparin  before hand when on dialysis. Pt thinks took coumadin for 9-12 mo after transplant but pt does not recal  Nothing  done  since        cyst of kidney  1/20 pt contacted transplat team, they are not concerned about the 8 cm cyst at the time, pt scheduled for fu for Feb 5th 1/20 8 cm seen on transplanted kidney, sending to transplant team, need to verify that this is not worrisome    diabetes mellitus type 2  Pt has been in  [place] control but  Too much control  Pt's Aic 5.9%  Pt and I discussed how detrimental low BS can be   Insulin  needs to be adjusted   Pt brings up medication oral agents he has been on in the past and he would  like to be off insulin  and oral agents only   However as he has had a renal transplant and can not take medications he was on in the past       No recent low  BS      diabetic peripheral neuropathy    essential hypertension  bp mild increase but at radiation  it is high   will add  clonidine    0.1   bid  and monitor        gastroesophageal reflux disease currently  stable     glaucoma  2020 pt has fu at [facility] severe Right, followed at [facility], last visit 5/17 sees every 11 mo.    Hx of his Herpes simplex    history of renal transplant left   dec   2005  [facility]    1/20 pt has fu Feb 5th 12/19 Cr 1.69 GFR 45 6/19 Cr 1.41 GFR 56 3/19 Cr 1.67 GFR 46 11/18 seen by Dr. Hendricks Locker, renal fx worsening, no comment on cause 11/18 Cr 1.65 GFR 47    history of transplantation of pancreas  12/19 [place] level nl 8.4  amylase  and lipse nl PK txp 2005, pt reports had renal failure and was on HD, qualified for ki tp. 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    hyperlipidemia  Stable on fenofibrate  and statin     legal blindness    obesity  12/18 pt would like referral for bariatric surgery, would like to be seen by [facility] because of his hx of transplant    obstructive sleep apnea syndrome  4/18 compliant with CPAP, using nasal pillow, MILD OSA 12/17 mild, will f/u with sleep med for CPAP, may be cause of fatigue (rather than low T)    referred to dr Wynn Hector   and following  cpap    peptic ulcer  9/18 seen by gen surg/[facility], EGD and sono pending 11/17 pathology report from EGD and bx shows gastric ulcer, no neoplasm, H pylori negative 3/18 will need to decide on f/u EGD with Dr. Alayne Hubert hx of Hiatal hernia    polyp of colon  had cscope 5/17, bx showed melanosis coli only, no colitis    prostate specific antigen abnormal  3/19 PSA 1.5 PSA rise 2016 to 2017 on testosterone , followed by urology/[facility], last seen 3/18    testicular hypofunction  5/18 followed by urology, they will follow PSA and testosterone   levels prescribed gel 5/18 PSA 1.4 (slightly lower than last) followed by urology 5/18 PSA velocity increase  Pt needs  refill on medication   lab  needed     Vitamin D  deficiency  On supplement            Diabetes        REVIEW OF SYSTEMS:   Review of Systems  General: No fever.  No chills. Wt loss    HEENT: No vision changes.  Cardiac: No chest pain. No palpitations.  No dizziness.  No light-headedness.  No near syncope.  Resp: No dyspnea at rest, no dyspnea on exertion; no cough or hemoptysis; no orthopnea or PND.  GI: No N/V. No melena.  No bright red blood per rectum.  Ext: No edema.  No claudication.  Neuro: No focal weakness.  No numbness.  All other ROS negative.      Objective: BP (!) 150/90 (Site: Left Arm, Patient Position: Sitting)   Pulse 80   Ht 1.829 m (6')   Wt (!) 141 kg (311 lb 9.6 oz)   SpO2 96%   BMI 42.26 kg/m                PHYSICAL EXAM  Physical Exam  Gen: NAD. Alert.   HEENT: PERRL; conjunctivae clear. No JVD or carotid bruit.  Cardiac: RRR with normal S1, S2.   Lungs: Clear to auscultation bilaterally. No rales. No wheezing. No rhonchi.    Lab Results   Component Value Date    CHOLESTEROL 162 06/11/2023    HDLCHOL 38 (L) 06/11/2023    LDLCHOL 75 06/11/2023    TRIG 246 (H) 06/11/2023       COMPLETE BLOOD COUNT   Lab Results   Component Value Date    WBC 8.1 08/26/2023    HGB 10.0 (L) 08/26/2023    HCT 31.6 (L) 08/26/2023    PLTCNT 234 08/26/2023    BANDS 3 (L) 09/09/2021       DIFFERENTIAL  Lab Results   Component Value Date    PMNS 66 06/11/2023    LYMPHOCYTES 21 06/11/2023    MYELOCYTES 3 09/08/2021    MONOCYTES 9 06/11/2023  EOSINOPHIL 3 06/11/2023    BASOPHILS 1 06/11/2023    BASOPHILS 0.00 06/11/2023    PMNABS 3.30 06/11/2023    LYMPHSABS 1.10 06/11/2023    EOSABS 0.20 06/11/2023    MONOSABS 0.50 06/11/2023        COMPREHENSIVE METABOLIC PANEL  Lab Results   Component Value Date    SODIUM 141 08/31/2023    POTASSIUM 4.2 08/31/2023    CHLORIDE 101 08/31/2023    CO2 32 (H)  08/31/2023    ANIONGAP 8 08/31/2023    BUN 60 (H) 08/31/2023    CREATININE 3.04 (H) 08/31/2023    GLUCOSENF 124 (H) 08/31/2023    GLUCOSE Negative 08/26/2023    CALCIUM 9.9 08/31/2023    PHOSPHORUS 3.0 (L) 02/09/2023    ALBUMIN 4.0 08/31/2023    TOTALPROTEIN 6.7 08/31/2023    ALKPHOS 40 08/31/2023    AST 22 08/31/2023    ALT 25 08/31/2023    GFR 23 (L) 08/31/2023     THYROID  STIMULATING HORMONE  Lab Results   Component Value Date    TSH 2.750 06/11/2023        Lab Results   Component Value Date    HA1C 6.0 08/31/2023     Lab Results   Component Value Date    VITD 46 05/09/2022         Orders Placed This Encounter    allopurinoL  (ZYLOPRIM ) 100 mg Oral Tablet    amLODIPine  (NORVASC ) 5 mg Oral Tablet    atenoloL  (TENORMIN ) 50 mg Oral Tablet    carvediloL  (COREG ) 25 mg Oral Tablet    cloNIDine  HCL (CATAPRES ) 0.1 mg Oral Tablet    empagliflozin  (JARDIANCE ) 10 mg Oral Tablet    famotidine  (PEPCID ) 40 mg Oral Tablet    fenofibrate  (LOFIBRA) 160 mg Oral Tablet    furosemide  (LASIX ) 20 mg Oral Tablet    gabapentin  (NEURONTIN ) 800 mg Oral Tablet    hydroCHLOROthiazide  (HYDRODIURIL ) 25 mg Oral Tablet    insulin  aspart U-100 (NOVOLOG  FLEXPEN U-100 INSULIN ) 100 unit/mL (3 mL) Subcutaneous Insulin  Pen    pancreatic enzyme replacement (CREON ) 36,000-114,000- 180,000 unit Oral Capsule, Delayed Release(E.C.)      Assessment & Plan  Paroxysmal atrial fibrillation (CMS HCC)    Mixed hyperlipidemia    Renal transplant recipient  19 yrs in Dec 2025 out and continues on  transplant team and nephrology    Type 2 diabetes mellitus without complication, with long-term current use of insulin     CKD (chronic kidney disease) stage 4, GFR 15-29 ml/min (CMS HCC)      Importance of medication adherence discussed.    Meds reviewed as well as labs.  Chart reviewed and updated.   Continue current treatment.  Keep follow-up appointment.   Vaccine hx reviewed.   Continue  to see nephrology  and transplant team  yearly    BS reviewed  less low blood  sugars   All medications have been reviewed 15 orders have been placed each medication was discussed and labs will be drawn    On the day of the encounter, a total of  35 minutes was spent on this patient encounter including review of historical information, examination, documentation and post-visit activities.

## 2023-10-02 NOTE — Assessment & Plan Note (Signed)
 19 yrs in Dec 2025 out and continues on  transplant team and nephrology

## 2023-10-03 ENCOUNTER — Ambulatory Visit (INDEPENDENT_AMBULATORY_CARE_PROVIDER_SITE_OTHER): Payer: Self-pay | Admitting: Internal Medicine

## 2023-10-08 ENCOUNTER — Telehealth (INDEPENDENT_AMBULATORY_CARE_PROVIDER_SITE_OTHER): Payer: Self-pay | Admitting: Internal Medicine

## 2023-10-08 ENCOUNTER — Other Ambulatory Visit (INDEPENDENT_AMBULATORY_CARE_PROVIDER_SITE_OTHER): Payer: Self-pay | Admitting: Internal Medicine

## 2023-10-09 ENCOUNTER — Telehealth (INDEPENDENT_AMBULATORY_CARE_PROVIDER_SITE_OTHER): Payer: Self-pay | Admitting: Internal Medicine

## 2023-11-17 ENCOUNTER — Ambulatory Visit (HOSPITAL_BASED_OUTPATIENT_CLINIC_OR_DEPARTMENT_OTHER): Payer: Medicare Other

## 2023-11-17 ENCOUNTER — Ambulatory Visit (HOSPITAL_BASED_OUTPATIENT_CLINIC_OR_DEPARTMENT_OTHER): Payer: Medicare Other | Admitting: RADIATION ONCOLOGY

## 2023-11-18 ENCOUNTER — Other Ambulatory Visit (INDEPENDENT_AMBULATORY_CARE_PROVIDER_SITE_OTHER): Payer: Self-pay | Admitting: Internal Medicine

## 2023-11-18 MED ORDER — TACROLIMUS 1 MG CAPSULE, IMMEDIATE-RELEASE
3.0000 mg | ORAL_CAPSULE | Freq: Two times a day (BID) | ORAL | 3 refills | Status: DC
Start: 2023-11-18 — End: 2023-11-18

## 2023-11-18 MED ORDER — MAGNESIUM OXIDE 400 MG (241.3 MG MAGNESIUM) TABLET
400.0000 mg | ORAL_TABLET | Freq: Two times a day (BID) | ORAL | 3 refills | Status: AC
Start: 2023-11-18 — End: ?

## 2023-11-18 MED ORDER — TACROLIMUS 1 MG CAPSULE, IMMEDIATE-RELEASE
3.0000 mg | ORAL_CAPSULE | Freq: Two times a day (BID) | ORAL | 3 refills | Status: AC
Start: 2023-11-18 — End: ?

## 2023-11-18 MED ORDER — MAGNESIUM OXIDE 400 MG (241.3 MG MAGNESIUM) TABLET
400.0000 mg | ORAL_TABLET | Freq: Two times a day (BID) | ORAL | 3 refills | Status: DC
Start: 2023-11-18 — End: 2023-11-18

## 2023-11-18 MED ORDER — ERGOCALCIFEROL (VITAMIN D2) 1,250 MCG (50,000 UNIT) CAPSULE
50000.0000 [IU] | ORAL_CAPSULE | ORAL | 3 refills | Status: DC
Start: 2023-11-18 — End: 2024-02-15

## 2023-11-18 MED ORDER — CHOLECALCIFEROL (VITAMIN D3) 1,250 MCG (50,000 UNIT) CAPSULE
50000.0000 [IU] | ORAL_CAPSULE | ORAL | 3 refills | Status: DC
Start: 2023-11-18 — End: 2023-12-07

## 2023-11-23 ENCOUNTER — Other Ambulatory Visit (INDEPENDENT_AMBULATORY_CARE_PROVIDER_SITE_OTHER): Payer: Self-pay | Admitting: Internal Medicine

## 2023-11-23 MED ORDER — PRAVASTATIN 20 MG TABLET
20.0000 mg | ORAL_TABLET | Freq: Every evening | ORAL | 1 refills | Status: DC
Start: 2023-11-23 — End: 2023-12-07

## 2023-11-26 ENCOUNTER — Ambulatory Visit
Admission: RE | Admit: 2023-11-26 | Discharge: 2023-11-26 | Disposition: A | Discharge status: Still In Hospital | Source: Ambulatory Visit | Attending: RADIATION ONCOLOGY | Admitting: RADIATION ONCOLOGY

## 2023-11-26 ENCOUNTER — Other Ambulatory Visit: Payer: Self-pay

## 2023-11-26 DIAGNOSIS — Z923 Personal history of irradiation: Secondary | ICD-10-CM | POA: Insufficient documentation

## 2023-11-26 DIAGNOSIS — Z79624 Long term (current) use of inhibitors of nucleotide synthesis: Secondary | ICD-10-CM | POA: Insufficient documentation

## 2023-11-26 DIAGNOSIS — Z85828 Personal history of other malignant neoplasm of skin: Secondary | ICD-10-CM | POA: Insufficient documentation

## 2023-11-26 DIAGNOSIS — L989 Disorder of the skin and subcutaneous tissue, unspecified: Secondary | ICD-10-CM | POA: Insufficient documentation

## 2023-11-26 DIAGNOSIS — C4401 Basal cell carcinoma of skin of lip: Secondary | ICD-10-CM

## 2023-11-26 DIAGNOSIS — Z08 Encounter for follow-up examination after completed treatment for malignant neoplasm: Secondary | ICD-10-CM | POA: Insufficient documentation

## 2023-11-26 DIAGNOSIS — Z720 Tobacco use: Secondary | ICD-10-CM | POA: Insufficient documentation

## 2023-11-26 DIAGNOSIS — Z79621 Long term (current) use of calcineurin inhibitor: Secondary | ICD-10-CM | POA: Insufficient documentation

## 2023-11-26 NOTE — Progress Notes (Signed)
 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  He has a new area in front of his right ear in his worrisome for a basal cell carcinoma.  He is at risk due to his prolonged exposure to organ transplant medication.      Recommendations:  Recommended he see his dermatologist for evaluation and potential biopsy of the lesion in front of the ear.  I will see him back in follow up in 6 months but he is instructed to call in the interim should he have any problems or concerns or new diagnosis that he needs attention for.    The indications, time course, benefits, risks and side effects of radiation treatment were explained to the patient, and his questions were answered to his apparent satisfaction. I encouraged him to contact us  at any time should he have any further questions or concerns. I personally saw and examined the patient, and reviewed all prior imaging and pathologic findings with him. On the day of the encounter, a total of 30 minutes was spent on this patient encounter including review of historical data, examination, documentation and post-visit activities.  This time documented excludes procedural time.   FULL NOTE     Interval History :   Martin Porter is a 58 y.o. male with a history of basal cell carcinoma lip.  He is doing well after definitive radiotherapy last year.  He has no pain or complaints regarding the lip.  He does have a new area front of his right ear that he is worried about.  This has been scabbing in his gotten larger.  He has been on transplant rejection medication for many years in his at risk for skin cancers.    Pain Assessment:  None    Past Medical/Surgical History:  Past Medical  History:   Diagnosis Date    Abnormal prostate specific antigen     Anemia, unspecified     Atrial fibrillation     Candida infection     CKD (chronic kidney disease) stage 3, GFR 30-59 ml/min     Cyst of kidney, acquired     Diabetes mellitus, type 2     Diabetic peripheral neuropathy (CMS HCC)     GERD (gastroesophageal reflux disease)     Herpes simplex     Hiatal hernia     Hypogonadism in male     Hypomagnesemia     Impotence     Legal blindness     Mixed hyperlipidemia     Obesity, unspecified     12/18 pt would like referral for bariatric surgery, would like to be [facility] by [facility] because of his hx of transplant    Obstructive sleep apnea syndrome     4/18 compliant with CPAP, using nasal PILLOW MILD OSA. 12/17 MILD, WILL FOLLOW UP WITH SLEEP MED FOR CPAP MAY  BE BECAUSE FATIGUE    Peptic ulcer     9/18 seen by gen surg/Reese, EGD and sono pending gastric ulcer, no neoplasm, H pylori negative 3/18 will need to decide on f/u EGD with Dr. ANASTASIA    Polyp of colon  Renal transplant recipient     Testicular hypofunction     5/18 followed by urology, they will follow PSA and testosterone  levels prescribed gel 5/18 PSA 1.4 (slightly lower than last) followed by urology 5/18 PSA velocity increased form 2016-2017, testosterone  held, was to have repeat PSA/VitD and T levels in our clinic and CC to Dr. Elmer Right testicular hypotrophy    Tobacco dependence syndrome     Type 2 diabetes mellitus without complications     Unspecified glaucoma(365.9)     Vitamin D  deficiency          Past Surgical History:   Procedure Laterality Date    COLONOSCOPY      DEBRIDEMENT  FOOT Right     EYE SURGERY      FOOT SURGERY      HX HERNIA REPAIR      HX RENAL TRANSPLANT      1/20 pt has fu Feb 5th 12/19 Cr 1.69 GFR 45 6/19 Cr 1.41 GFR 56 3/19 Cr 1.67 GFR 46 11/18 seen by Dr. Estevan, renal fx worsening, no comment on cause 11/18 Cr 1.65 GFR 478/18 Cr 1.95 GFR 38 6/18 Cr 1.5, Ca mildly high, SPE P/UPEPpending and asap f/u with nephrology  5/18 back to baseline cr 1.2 and GFR 68 CKD GFR 59 5/18 and was 59 11/17. Last Creat 1.36 increased from 1.2 11/17.    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 It seems that pt underwent  pancreas tp to cure DM/islet cells  a few years ago pt seen by endo and given OHG had pancreatitis, thought due to the DM medicines    SHOULDER SURGERY      SURGERY OF LIP      basal cell ca    Radiation   16   tx Dr  Newell           Family History:   Family Medical History:       Problem Relation (Age of Onset)    COPD Father    Diabetes type II Mother, Father    Other Maternal Grandfather              Social History:   Social History     Socioeconomic History    Marital status: Single     Spouse name: Not on file    Number of children: Not on file    Years of education: Not on file    Highest education level: Not on file   Occupational History    Not on file   Tobacco Use    Smoking status: Never    Smokeless tobacco: Current     Types: Snuff   Vaping Use    Vaping status: Never Used   Substance and Sexual Activity    Alcohol use: Not Currently     Comment: rarely    Drug use: Never    Sexual activity: Not Currently   Other Topics Concern    Not on file   Social History Narrative    Not on file     Social Determinants of Health     Financial Resource Strain: Low Risk  (11/04/2021)    Financial Resource Strain     SDOH Financial: No  Transportation Needs: No Transportation Needs (07/31/2023)    Received from [employer]     In the past 12 months, has lack of reliable transportation kept you from medical appointments, meetings, work or from getting things needed for daily living? : No   Social Connections: Medium Risk (11/04/2021)    Social Connections     SDOH Social Isolation: 3 to 5 times a week   Intimate Partner  Violence: Low Risk  (11/04/2021)    Intimate Partner Violence     SDOH Domestic Violence: No   Housing Stability: Low Risk  (07/31/2023)    Received from [employer] Stability Vital Sign     What is your living situation today?: I have a steady place to live     Think about the place you live. Do you have problems with any of the following? Choose all that apply:: None/None on this list       ALLERGIES:   Allergies[1]     MEDICATIONS:   Current Outpatient Medications   Medication Instructions    acetaminophen  (TYLENOL ) 500 mg, EVERY 4 HOURS PRN    allopurinoL  (ZYLOPRIM ) 100 mg, Oral, Daily    amLODIPine  (NORVASC ) 5 mg, Oral, 2 TIMES DAILY    aspirin (ECOTRIN) 81 mg    atenoloL  (TENORMIN ) 50 mg, Oral, 2 TIMES DAILY    atropine  (ISOPTO ATROPINE ) 1 % Ophthalmic Drops 1 Drop, 4 TIMES DAILY    carvediloL  (COREG ) 25 mg, Oral, 2 TIMES DAILY, Take with food.    cholecalciferol  (vitamin D3) 50,000 Units, Oral, EVERY 7 DAYS    cloNIDine  HCL (CATAPRES ) 0.1 mg, Oral, 2 TIMES DAILY    cyanocobalamin (VITAMIN B 12) 2,000 mcg    docusate sodium (COLACE) 100 mg    dorzolamide -timoloL  (COSOPT ) 22.3-6.8 mg/mL Ophthalmic Drops 1 Drop, 2 TIMES DAILY    empagliflozin  (JARDIANCE ) 10 mg, Oral, Daily    ergocalciferol  (vitamin D2) (DRISDOL ) 50,000 Units, Oral, EVERY 7 DAYS    famotidine  (PEPCID ) 40 mg, Oral, 2 TIMES DAILY    fenofibrate  (LOFIBRA) 160 mg, Oral, Daily    furosemide  (LASIX ) 40 mg, 2 TIMES DAILY    gabapentin  (NEURONTIN ) 800 mg, Oral, 3 TIMES DAILY    hydroCHLOROthiazide  (HYDRODIURIL ) 25 mg, Oral, Daily    insulin  aspart U-100 (NOVOLOG  FLEXPEN U-100 INSULIN ) 100 unit/mL (3 mL) Subcutaneous Insulin  Pen Taking 80-85 units daily    insulin  glargine (LANTUS  SOLOSTAR U-100 INSULIN ) 100 unit/mL Subcutaneous Insulin  Pen INJECT 95 UNITS UNDER THE SKIN AT BEDTIME    iron  ps complex-B12-folic acid  (POLY-IRON  150 FORTE) 150-25-1 mg-mcg-mg Oral Capsule 2 Capsules, Oral, Daily    magnesium  oxide (MAG-OX) 400 mg, Oral, 2 TIMES  DAILY    multivitamin with iron  Oral Tablet 1 Tablet, Daily    mupirocin  (BACTROBAN ) 2 % Ointment Apply Topically, 3 TIMES DAILY    mycophenolate  sodium (MYFORTIC ) 540 mg, Oral, 2 TIMES DAILY    neomycin -bacitracin-polymyxin (TRIPLE ANTIBIOTIC) 3.5mg -400 unit- 5,000 unit/gram Ointment Apply Topically, 2 TIMES DAILY PRN    nystatin  (MYCOSTATIN ) 100,000 unit/gram Cream Apply Topically, 2 TIMES DAILY    pantoprazole  (PROTONIX ) 40 mg, Oral, 2 TIMES DAILY    Pen Needle, Disposable, (BD UF MINI PEN NEEDLE) 31 gauge x 3/16 Needle USE 5 TIMES A DAY    potassium citrate  (UROCIT-K ) 10 mEq (1,080 mg) Oral Tablet Sustained Release 10 mEq, Oral, 2 TIMES DAILY WITH FOOD    pravastatin  (PRAVACHOL ) 20 mg, Oral, NIGHTLY    prednisoLONE  acetate (  PRED FORTE ) 1 % Ophthalmic Drops, Suspension 1 Drop, Both Eyes, 2 TIMES DAILY    promethazine -dextromethorphan (PHENERGAN -DM) 6.25-15 mg/5 mL Oral Syrup 5 mL, Oral, 4 TIMES DAILY PRN    SSD 1 % Cream APPLY AS DIRECTED ONCE A DAY TO HEEL AND ANKLE    tacrolimus  (PROGRAF ) 3 mg, Oral, 2 TIMES DAILY    testosterone  (ANDROGEL ) 20.25 mg, Daily    traZODone  (DESYREL ) 50 mg, Oral, NIGHTLY    trimethoprim -sulfamethoxazole  (BACTRIM ) 80-400mg  per tablet TAKE 1 TABLET 3 TIMES A WEEK    vitamin E 400 Units, Oral, Daily        REVIEW OF SYSTEMS  Pertinent review of systems as discussed in Interval History.      Objective:   There were no vitals filed for this visit.          PHYSICAL EXAMINATION  Physical Exam  Constitutional:       Appearance: Normal appearance.   HENT:      Head:      Comments: Upper lip with excellent healing no signs of recurrence new raised lesion with central scab in front of the right ear  Eyes:      Extraocular Movements: Extraocular movements intact.      Pupils: Pupils are equal, round, and reactive to light.   Pulmonary:      Effort: Pulmonary effort is normal.   Musculoskeletal:         General: Normal range of motion.   Skin:     General: Skin is warm.   Neurological:       General: No focal deficit present.      Mental Status: He is alert and oriented to person, place, and time.   Psychiatric:         Mood and Affect: Mood normal.         Behavior: Behavior normal.          LABS/IMAGING: All relevant labs and imaging were reviewed as per HPI.      Fairy Patten, MD 11/26/2023, 12:30    rr:[id number]          [1]   Allergies  Allergen Reactions    Strawberry

## 2023-12-02 ENCOUNTER — Other Ambulatory Visit (INDEPENDENT_AMBULATORY_CARE_PROVIDER_SITE_OTHER): Payer: Self-pay | Admitting: Internal Medicine

## 2023-12-07 ENCOUNTER — Ambulatory Visit: Attending: Internal Medicine | Admitting: Internal Medicine

## 2023-12-07 ENCOUNTER — Other Ambulatory Visit: Payer: Self-pay

## 2023-12-07 ENCOUNTER — Encounter (INDEPENDENT_AMBULATORY_CARE_PROVIDER_SITE_OTHER): Payer: Self-pay | Admitting: Internal Medicine

## 2023-12-07 VITALS — BP 136/86 | HR 81

## 2023-12-07 DIAGNOSIS — H6590 Unspecified nonsuppurative otitis media, unspecified ear: Secondary | ICD-10-CM | POA: Insufficient documentation

## 2023-12-07 DIAGNOSIS — R42 Dizziness and giddiness: Secondary | ICD-10-CM | POA: Insufficient documentation

## 2023-12-07 DIAGNOSIS — H6593 Unspecified nonsuppurative otitis media, bilateral: Secondary | ICD-10-CM

## 2023-12-07 MED ORDER — GABAPENTIN 800 MG TABLET
800.0000 mg | ORAL_TABLET | Freq: Three times a day (TID) | ORAL | 2 refills | Status: DC
Start: 2023-12-07 — End: 2024-02-15

## 2023-12-07 MED ORDER — CHOLECALCIFEROL (VITAMIN D3) 1,250 MCG (50,000 UNIT) CAPSULE
50000.0000 [IU] | ORAL_CAPSULE | ORAL | 3 refills | Status: AC
Start: 2023-12-07 — End: ?

## 2023-12-07 MED ORDER — PRAVASTATIN 20 MG TABLET
20.0000 mg | ORAL_TABLET | Freq: Every evening | ORAL | 1 refills | Status: DC
Start: 2023-12-07 — End: 2024-02-15

## 2023-12-07 MED ORDER — FUROSEMIDE 40 MG TABLET
40.0000 mg | ORAL_TABLET | Freq: Two times a day (BID) | ORAL | 2 refills | Status: DC
Start: 2023-12-07 — End: 2024-02-15

## 2023-12-07 MED ORDER — PEN NEEDLE, DIABETIC 31 GAUGE X 3/16"
2 refills | Status: AC
Start: 2023-12-07 — End: ?

## 2023-12-07 MED ORDER — ASPIRIN 81 MG TABLET,DELAYED RELEASE
81.0000 mg | DELAYED_RELEASE_TABLET | Freq: Every day | ORAL | Status: AC
Start: 2023-12-07 — End: ?

## 2023-12-07 MED ORDER — PANTOPRAZOLE 40 MG TABLET,DELAYED RELEASE
40.0000 mg | DELAYED_RELEASE_TABLET | Freq: Two times a day (BID) | ORAL | 4 refills | Status: DC
Start: 2023-12-07 — End: 2024-02-15

## 2023-12-07 MED ORDER — AMLODIPINE 5 MG TABLET
5.0000 mg | ORAL_TABLET | Freq: Two times a day (BID) | ORAL | 4 refills | Status: DC
Start: 2023-12-07 — End: 2024-02-15

## 2023-12-07 MED ORDER — FIBER FUSION DAILY 620 MG CAPSULE
6.0000 | ORAL_CAPSULE | Freq: Every day | ORAL | 3 refills | Status: AC
Start: 2023-12-07 — End: 2024-01-06

## 2023-12-07 MED ORDER — HYDROCHLOROTHIAZIDE 25 MG TABLET
25.0000 mg | ORAL_TABLET | Freq: Every day | ORAL | 3 refills | Status: DC
Start: 2023-12-07 — End: 2024-02-15

## 2023-12-07 MED ORDER — CLONIDINE HCL 0.1 MG TABLET
0.1000 mg | ORAL_TABLET | Freq: Two times a day (BID) | ORAL | 3 refills | Status: AC
Start: 2023-12-07 — End: ?

## 2023-12-07 MED ORDER — MYCOPHENOLATE SODIUM 180 MG TABLET,DELAYED RELEASE: 540.0000 mg | DELAYED_RELEASE_TABLET | Freq: Two times a day (BID) | ORAL | 3 refills | Status: DC

## 2023-12-07 MED ORDER — PREDNISONE 20 MG TABLET
20.0000 mg | ORAL_TABLET | Freq: Every day | ORAL | 0 refills | Status: AC
Start: 2023-12-07 — End: 2023-12-12

## 2023-12-07 NOTE — Progress Notes (Signed)
 [facility]  [address]  [place] [address]  Operated by [facility]  Progress Note    Name: Martin Porter MRN:  [id number]   Date: 12/07/2023 DOB:  01-Aug-1965 (58 y.o.)              Chief Complaint: Ear Problem(s) (Ear problems  feels like stopped and buzzing)       HPI: Martin Porter is a 58 y.o. male who complains of  ear pain/ clogged on left > right    and dizziness   Feels  off balance  when walking and ear  pressure      More dry mucus  in nose  no fever or chills    No nvd         Allergies:  Allergies[1]    acetaminophen  (TYLENOL ) 500 mg Oral Tablet, Take 1 Tablet (500 mg total) by mouth Every 4 hours as needed for Pain  allopurinoL  (ZYLOPRIM ) 100 mg Oral Tablet, Take 1 Tablet (100 mg total) by mouth Daily  aspirin  (ECOTRIN) 81 mg Oral Tablet, Delayed Release (E.C.), Take 1 Tablet (81 mg total) by mouth  atenoloL  (TENORMIN ) 50 mg Oral Tablet, Take 1 Tablet (50 mg total) by mouth Twice daily  atropine  (ISOPTO ATROPINE ) 1 % Ophthalmic Drops, 1 Drop Four times a day  carvediloL  (COREG ) 25 mg Oral Tablet, Take 1 Tablet (25 mg total) by mouth Twice daily Take with food.  CREON  24,000-76,000 -120,000 unit Oral Capsule, Delayed Release(E.C.), Take 2 Capsules by mouth Three times daily with meals  docusate sodium (COLACE) 100 mg Oral Capsule, Take 1 Capsule (100 mg total) by mouth  dorzolamide -timoloL  (COSOPT ) 22.3-6.8 mg/mL Ophthalmic Drops, Administer 1 Drop into the left eye Twice daily  empagliflozin  (JARDIANCE ) 10 mg Oral Tablet, Take 1 Tablet (10 mg total) by mouth Daily  ergocalciferol , vitamin D2, (DRISDOL ) 1,250 mcg (50,000 unit) Oral Capsule, Take 1 Capsule (50,000 Units total) by mouth Every 7 days  famotidine  (PEPCID ) 40 mg Oral Tablet, Take 1 Tablet (40 mg total) by mouth Twice daily  fenofibrate  (LOFIBRA) 160 mg Oral Tablet, Take 1 Tablet (160 mg total) by mouth Daily  insulin  aspart U-100 (NOVOLOG  FLEXPEN U-100 INSULIN ) 100 unit/mL (3 mL)  Subcutaneous Insulin  Pen, Taking 80-85 units daily  insulin  glargine (LANTUS  SOLOSTAR U-100 INSULIN ) 100 unit/mL Subcutaneous Insulin  Pen, INJECT 95 UNITS UNDER THE SKIN AT BEDTIME  iron  ps complex-B12-folic acid  (POLY-IRON  150 FORTE) 150-25-1 mg-mcg-mg Oral Capsule, TAKE TWO CAPSULES BY MOUTH EVERY DAY  magnesium  oxide (MAG-OX) 400 mg Oral Tablet, Take 1 Tablet (400 mg total) by mouth Twice daily  multivitamin with iron  Oral Tablet, Take 1 Tablet by mouth Daily  nystatin  (MYCOSTATIN ) 100,000 unit/gram Cream, Apply topically Twice daily  prednisoLONE  acetate (PRED FORTE ) 1 % Ophthalmic Drops, Suspension, Instill 1 Drop into both eyes Twice daily  tacrolimus  (PROGRAF ) 1 mg Oral Capsule, Take 3 Capsules (3 mg total) by mouth Twice daily  testosterone  (ANDROGEL ) 20.25 mg/1.25 gram (1.62 %) Transdermal Gel in Metered-dose Pump, Place 20.25 mg on the skin Once a day  traZODone  (DESYREL ) 50 mg Oral Tablet, TAKE ONE TABLET BY MOUTH EVERY NIGHT  vitamin E 400 unit Oral Capsule, Take 1 Capsule (400 Units total) by mouth Once a day  amLODIPine  (NORVASC ) 5 mg Oral Tablet, Take 1 Tablet (5 mg total) by mouth Twice daily  cholecalciferol , vitamin D3, 1,250 mcg (50,000 unit) Oral Capsule, Take 1 Capsule (50,000 Units total) by mouth Every 7 days  cloNIDine   HCL (CATAPRES ) 0.1 mg Oral Tablet, Take 1 Tablet (0.1 mg total) by mouth Twice daily  cyanocobalamin (VITAMIN B 12) 1,000 mcg Oral Tablet, Take 2 Tablets (2,000 mcg total) by mouth (Patient not taking: Reported on 10/02/2023)  furosemide  (LASIX ) 40 mg Oral Tablet, Take 1 Tablet (40 mg total) by mouth Twice daily  gabapentin  (NEURONTIN ) 800 mg Oral Tablet, Take 1 Tablet (800 mg total) by mouth Three times a day  hydroCHLOROthiazide  (HYDRODIURIL ) 25 mg Oral Tablet, Take 1 Tablet (25 mg total) by mouth Daily  mupirocin  (BACTROBAN ) 2 % Ointment, Apply topically Three times a day (Patient not taking: Reported on 08/31/2023)  mycophenolate  sodium (MYFORTIC ) 180 mg Oral Tablet, Delayed  Release (E.C.), Take 3 Tablets (540 mg total) by mouth Twice daily  neomycin -bacitracin-polymyxin (TRIPLE ANTIBIOTIC) 3.5mg -400 unit- 5,000 unit/gram Ointment, Apply topically Twice per day as needed (Patient not taking: Reported on 10/02/2023)  pantoprazole  (PROTONIX ) 40 mg Oral Tablet, Delayed Release (E.C.), TAKE ONE TABLET BY MOUTH TWICE A DAY  Pen Needle, Disposable, (BD UF MINI PEN NEEDLE) 31 gauge x 3/16 Needle, USE 5 TIMES A DAY  potassium citrate  (UROCIT-K ) 10 mEq (1,080 mg) Oral Tablet Sustained Release, Take 1 Tablet (10 mEq total) by mouth Twice daily with food (Patient not taking: Reported on 10/02/2023)  pravastatin  (PRAVACHOL ) 20 mg Oral Tablet, Take 1 Tablet (20 mg total) by mouth Every night  promethazine -dextromethorphan (PHENERGAN -DM) 6.25-15 mg/5 mL Oral Syrup, Take 5 mL by mouth Four times a day as needed for Cough (Patient not taking: Reported on 06/05/2023)  SSD 1 % Cream, APPLY AS DIRECTED ONCE A DAY TO HEEL AND ANKLE (Patient not taking: Reported on 10/02/2023)  trimethoprim -sulfamethoxazole  (BACTRIM ) 80-400mg  per tablet, TAKE 1 TABLET 3 TIMES A WEEK (Patient not taking: Reported on 10/02/2023)    No facility-administered medications prior to visit.       Review of Systems -   ROS  Const  Reports system reviewed and no additional complaints, except as documented  ENT  Reports system reviewed and no additional complaints, except as documented  Resp  Reports system reviewed and no additional complaints, except as documented  GI  Reports system reviewed and no additional complaints, except as documented     OBJECTIVE:  Vitals:    12/07/23 0956   BP: 136/86   Pulse: 81   SpO2: 98%      Physical Exam   Exam-AMB  Const  General: cooperative, no acute distress and alert  EYE: PERRL  Nl Conjunctiva  Sclera nonicteric   HENMT     tm dull bilateral   minimal wax flushed on left ear      Head: normal to inspection  General nose exam: external nose normal  Neck  Neck: normal visual inspection  Lymphatic: no  lymphadenopathy noted  Resp  Auscultation: clear without RRW           ASSESSMENT:     ICD-10-CM    1. Dizziness  R42       2. Serous otitis media  H65.90           Orders Placed This Encounter    hydroCHLOROthiazide  (HYDRODIURIL ) 25 mg Oral Tablet    cloNIDine  HCL (CATAPRES ) 0.1 mg Oral Tablet    cholecalciferol , vitamin D3, 1,250 mcg (50,000 unit) Oral Capsule    pantoprazole  (PROTONIX ) 40 mg Oral Tablet, Delayed Release (E.C.)    furosemide  (LASIX ) 40 mg Oral Tablet    amLODIPine  (NORVASC ) 5 mg Oral Tablet    pravastatin  (PRAVACHOL )  20 mg Oral Tablet    gabapentin  (NEURONTIN ) 800 mg Oral Tablet    mycophenolate  sodium (MYFORTIC ) 180 mg Oral Tablet, Delayed Release (E.C.)    Pen Needle, Disposable, (BD UF MINI PEN NEEDLE) 31 gauge x 3/16 Needle    aspirin  (ECOTRIN) 81 mg Oral Tablet, Delayed Release (E.C.)    psyllium husk-bran-guar-pectin (FIBER FUSION DAILY) 620 mg Oral Capsule    predniSONE  (DELTASONE ) 20 mg Oral Tablet        PLAN: Treatment per orders . Call or return to clinic prn if these symptoms worsen or fail to improve as anticipated.  Meds reviewed as well as labs.  All meds updated   Allegra   180 one  daily   and prednisone   20 mg daily x  5 days     All meds reviewed and sent        Suzen DELENA Asa, PA-C           [1]   Allergies  Allergen Reactions    Strawberry

## 2024-01-08 ENCOUNTER — Other Ambulatory Visit (INDEPENDENT_AMBULATORY_CARE_PROVIDER_SITE_OTHER): Payer: Self-pay | Admitting: Internal Medicine

## 2024-01-11 ENCOUNTER — Other Ambulatory Visit (INDEPENDENT_AMBULATORY_CARE_PROVIDER_SITE_OTHER): Payer: Self-pay | Admitting: Internal Medicine

## 2024-01-11 MED ORDER — LANTUS SOLOSTAR U-100 INSULIN 100 UNIT/ML (3 ML) SUBCUTANEOUS PEN
110.0000 [IU] | PEN_INJECTOR | Freq: Every day | SUBCUTANEOUS | 0 refills | Status: AC
Start: 2024-01-11 — End: ?

## 2024-01-11 MED ORDER — INSULIN ASPART (U-100) 100 UNIT/ML (3 ML) SUBCUTANEOUS PEN
120.0000 [IU] | PEN_INJECTOR | Freq: Every day | SUBCUTANEOUS | 3 refills | Status: DC
Start: 2024-01-11 — End: 2024-02-18

## 2024-01-22 ENCOUNTER — Other Ambulatory Visit (INDEPENDENT_AMBULATORY_CARE_PROVIDER_SITE_OTHER): Payer: Self-pay | Admitting: Internal Medicine

## 2024-02-11 ENCOUNTER — Ambulatory Visit (INDEPENDENT_AMBULATORY_CARE_PROVIDER_SITE_OTHER): Payer: Self-pay | Admitting: Internal Medicine

## 2024-02-15 ENCOUNTER — Ambulatory Visit: Payer: Self-pay | Attending: Internal Medicine | Admitting: Internal Medicine

## 2024-02-15 ENCOUNTER — Encounter (INDEPENDENT_AMBULATORY_CARE_PROVIDER_SITE_OTHER): Payer: Self-pay | Admitting: Internal Medicine

## 2024-02-15 ENCOUNTER — Other Ambulatory Visit: Payer: Self-pay

## 2024-02-15 DIAGNOSIS — Z794 Long term (current) use of insulin: Secondary | ICD-10-CM

## 2024-02-15 DIAGNOSIS — Z940 Kidney transplant status: Secondary | ICD-10-CM

## 2024-02-15 DIAGNOSIS — K219 Gastro-esophageal reflux disease without esophagitis: Secondary | ICD-10-CM

## 2024-02-15 DIAGNOSIS — Z0000 Encounter for general adult medical examination without abnormal findings: Secondary | ICD-10-CM

## 2024-02-15 DIAGNOSIS — E782 Mixed hyperlipidemia: Secondary | ICD-10-CM

## 2024-02-15 DIAGNOSIS — N401 Enlarged prostate with lower urinary tract symptoms: Secondary | ICD-10-CM

## 2024-02-15 DIAGNOSIS — R351 Nocturia: Secondary | ICD-10-CM

## 2024-02-15 DIAGNOSIS — E1122 Type 2 diabetes mellitus with diabetic chronic kidney disease: Secondary | ICD-10-CM

## 2024-02-15 DIAGNOSIS — N184 Chronic kidney disease, stage 4 (severe): Secondary | ICD-10-CM

## 2024-02-15 MED ORDER — EMPAGLIFLOZIN 10 MG TABLET
10.0000 mg | ORAL_TABLET | Freq: Every day | ORAL | 2 refills | Status: AC
Start: 2024-02-15 — End: ?

## 2024-02-15 MED ORDER — CARVEDILOL 25 MG TABLET
25.0000 mg | ORAL_TABLET | Freq: Two times a day (BID) | ORAL | 4 refills | Status: AC
Start: 2024-02-15 — End: ?

## 2024-02-15 MED ORDER — ATENOLOL 50 MG TABLET
50.0000 mg | ORAL_TABLET | Freq: Two times a day (BID) | ORAL | 4 refills | Status: AC
Start: 2024-02-15 — End: ?

## 2024-02-15 MED ORDER — ALLOPURINOL 100 MG TABLET
100.0000 mg | ORAL_TABLET | Freq: Every day | ORAL | 2 refills | Status: AC
Start: 2024-02-15 — End: ?

## 2024-02-15 MED ORDER — GABAPENTIN 800 MG TABLET
800.0000 mg | ORAL_TABLET | Freq: Three times a day (TID) | ORAL | 2 refills | Status: AC
Start: 2024-02-15 — End: ?

## 2024-02-15 MED ORDER — AMLODIPINE 5 MG TABLET
5.0000 mg | ORAL_TABLET | Freq: Two times a day (BID) | ORAL | 4 refills | Status: AC
Start: 2024-02-15 — End: ?

## 2024-02-15 MED ORDER — ERGOCALCIFEROL (VITAMIN D2) 1,250 MCG (50,000 UNIT) CAPSULE
50000.0000 [IU] | ORAL_CAPSULE | ORAL | 3 refills | Status: AC
Start: 2024-02-15 — End: ?

## 2024-02-15 MED ORDER — FENOFIBRATE 160 MG TABLET
160.0000 mg | ORAL_TABLET | Freq: Every day | ORAL | 4 refills | Status: AC
Start: 2024-02-15 — End: ?

## 2024-02-15 MED ORDER — FAMOTIDINE 40 MG TABLET
40.0000 mg | ORAL_TABLET | Freq: Two times a day (BID) | ORAL | 4 refills | Status: AC
Start: 2024-02-15 — End: ?

## 2024-02-15 MED ORDER — FUROSEMIDE 40 MG TABLET
40.0000 mg | ORAL_TABLET | Freq: Two times a day (BID) | ORAL | 2 refills | Status: DC
Start: 2024-02-15 — End: 2024-02-23

## 2024-02-15 MED ORDER — HYDROCHLOROTHIAZIDE 25 MG TABLET
25.0000 mg | ORAL_TABLET | Freq: Every day | ORAL | 3 refills | Status: AC
Start: 2024-02-15 — End: ?

## 2024-02-15 MED ORDER — PRAVASTATIN 20 MG TABLET: 20.0000 mg | ORAL_TABLET | Freq: Every evening | ORAL | 1 refills | Status: DC

## 2024-02-15 MED ORDER — PANTOPRAZOLE 40 MG TABLET,DELAYED RELEASE
40.0000 mg | DELAYED_RELEASE_TABLET | Freq: Two times a day (BID) | ORAL | 4 refills | Status: AC
Start: 2024-02-15 — End: ?

## 2024-02-15 NOTE — Nursing Note (Signed)
 Medicare annual wellness

## 2024-02-15 NOTE — Patient Instructions (Signed)
 Medicare Preventive Services  Medicare coverage information Recommendation for YOU   Heart Disease and Diabetes   Lipid profile every 5 years or more often if at risk for cardiovascular disease  Lab Results   Component Value Date    CHOLESTEROL 162 06/11/2023    HDLCHOL 38 (L) 06/11/2023    LDLCHOL 75 06/11/2023    TRIG 246 (H) 06/11/2023       Diabetes Screening with Blood Glucose test or Glucose Tolerance Test Yearly for those at risk for diabetes, up to two tests per year for those with prediabetes  Last Glucose: 76     Diabetes Self-Management Training   Initial training ten hours per year, and follow-up training two hours per subsequent year. Optional for those with diabetes    Medical Nutrition Therapy   Three hours of one-on-one counseling in first year, two hours in subsequent years. Optional for those with diabetes, kidney disease   Intensive Behavioral Therapy for Obesity  Face-to-face counseling, first month every week, month 2-6 every other week, month 7-12 every month if continued progress is documented Optional for those with Body Mass Index 30 or higher  Your There is no height or weight on file to calculate BMI.   Tobacco Cessation (Quitting) Counseling   Two attempts per year, max 4 sessions per attempt, up to 8 sessions per year Optional for those who use tobacco    Cancer Screening Last Completion Date   Colorectal screening   For anyone age 77 to 5 or any age if high risk:  Screening Colonoscopy every 10 yrs if low risk,  more frequent if higher risk  OR  Cologuard Stool DNA test once every 3 years OR  Fecal Occult Blood Testing yearly OR  Flexible  Sigmoidoscopy  every 5 yr OR  CT Colonography every 5 yrs    See below for due date if applicable.   Prostate Cancer Screening  Prostate Specific Antigen blood test based on joint decision making with your provider for ages 66-69  A joint decision between you and your primary care provider   Lung Cancer Screening  Annual low dose computed tomography  (LDCT scan) is recommended for those age 73-80 who smoked 20 pack-years and are current smokers or quit smoking within past 15 years, after counseling by your doctor or nurse clinician about the possible benefits or harms.   See below for due date if applicable.   Vaccinations   Respiratory syncytial virus (RSV)  Age 70 years or older: Based on shared clinical decision-making with your provider.  Pneumococcal Vaccine  Recommended routinely age 53+ with one or two separate vaccines based on your risk. Recommended before age 15 if medical conditions with increased risk  Seasonal Influenza Vaccine  Once every flu season   Hepatitis B Vaccine  3 doses if risk (including anyone with diabetes or liver disease)  Shingles Vaccine  Two doses at age 5 or older  Diphtheria Tetanus Pertussis Vaccine  ONCE as adult, booster every 10 years     Immunization History   Administered Date(s) Administered   . COVID-19 VACCINE,MODERNA(SPIKEVAX),50MCG/0.5ML,12 YR+,SEASONAL 07/31/2023   . Covid-19 Vaccine,Pfizer Bivalent,58mcg/0.3ml,12 yrs+ 07/10/2021   . Covid-19 Vaccine,Pfizer-BioNTech,Gray Top,56yrs+ 07/04/2020   . Covid-19 Vaccine,Pfizer-BioNTech,Purple Top,43yrs+ 05/16/2019, 06/06/2019, 01/20/2020   . HEPATITIS A VACCINE -ADULT 07/21/2017, 11/04/2017   . Hep B, Unspecified Formulation 11/04/2017, 12/08/2017   . Influenza Vaccine, 6 month-adult 05/30/2021, 01/29/2022, 02/10/2023   . Pneumococcal, Unspecified Formulation 05/05/2012   . Shingrix - Zoster Vaccine  08/13/2022   . Tetanus Toxoid/Diphtheria Toxoid/Acellular Pertussis Vaccine, Adsorbed 06/04/2016     Shingles vaccine and Diphtheria Tetanus Pertussis vaccines are available at pharmacies or local health department without a prescription.   Other Preventative Screening  Last Completion Date   Glaucoma Screening   Yearly if in high risk group such as diabetes, family history, African American age 76+ or Hispanic American age 2+ See your Eye Care Provider   Hepatitis C Screening    Recommended  for those born between ages 18-79 years. --08/28/2022  See below for due date if applicable.     HIV Testing  Recommended routinely at least ONCE, covered every year for age 11 to 75 regardless of risk, and every year for age over 44 who ask for the test or higher risk. Yearly or up to 3 times in pregnancy    See below for due date if applicable.  Bone Densitometry   Screening is recommended for Men ages 70 and above with one or more risk factor: androgen deprivation therapy for prostate cancer, hypogonadism, frailty, primary hyperparathyroidism, hyperthyroidism  For men diagnosed with osteoporosis, follow up is recommended every years or a frequency recommended by your provider   --09/05/2022  See below for due date if applicable.   Abdominal Aortic Aneurysm Screening Ultrasound   Once with a family history of abdominal aortic aneurysms OR a male between65-75 and have smoked at least 100 cigarettes in your lifetime.     See below for due date if applicable.       Your Personalized Schedule for Preventive Tests   Health Maintenance: Pending and Last Completed       Date Due Completion Date    Colonoscopy Never done ---    Influenza Vaccine (1) 03/16/2024 (Originally 01/04/2024) 02/10/2023    Covid-19 Vaccine (7 - 2025-26 season) 05/15/2024 (Originally 01/04/2024) 07/31/2023    Shingles Vaccine (2 of 2) 02/14/2025 (Originally 10/08/2022) 08/13/2022    Pneumococcal Vaccination, Age 57+ (1 of 2 - PCV) 02/14/2025 (Originally 08/18/1983) 05/05/2012    Diabetic A1C 03/01/2024 08/31/2023    Prostate Cancer Screening 08/27/2024 08/28/2022    Diabetic Kidney Health Microalb/Cr Ratio 08/30/2024 08/31/2023    Diabetic Kidney Health eGFR 10/01/2024 10/02/2023    Diabetic Retinal Exam 11/09/2024 11/10/2023    Depression Screening 02/14/2025 02/15/2024    Medicare Annual Wellness Visit 02/14/2025 02/15/2024    Adult Tdap-Td (2 - Td or Tdap) 06/04/2026 06/04/2016    Osteoporosis screening 09/04/2032 09/05/2022              For  Information on Advanced Directives for Health Care:  [place]:  [email or web address]  [place], [place], [place], [place] General Information: [email or web address]

## 2024-02-15 NOTE — Progress Notes (Signed)
 INTERNAL MEDICINE, [facility]ING A  [address]  [place] [address]  Operated by [facility]  Medicare Annual Wellness Visit    Name: Martin Porter MRN:  [id number]   Date: 02/15/2024 Age: 58 y.o.       SUBJECTIVE:   Martin Porter is a 58 y.o. male for presenting for Medicare Wellness exam.   I have reviewed and reconciled the medication list with the patient today.        02/15/2024     8:51 AM 02/10/2023    11:30 AM 01/29/2022     2:35 PM   Comprehensive Health Assessment-Adult   Do you wish to complete this form? Yes Yes Yes   During the past 4 weeks, how would you rate your health in general? Good Good Very Good   During the past 4 weeks, how much difficulty have you had doing your usual activities inside and outside your home because of medical or emotional problems? No difficulty at all Some difficulty No difficulty at all   During the past 4 weeks, was someone available to help you if you needed and wanted help? Yes, some Yes, some Yes, as much as I wanted   In the past year, how many times have you gone to the emergency department or been admitted to a hospital for a health problem? 2-4 times None 1 time   Are you generally satisfied with your sleep? No No Yes   Do you have enough money to buy things you need in everyday life, such as food, clothing, medicines, and housing? Yes, always Sometimes Yes, always   Can you get to places beyond walking distance without help?  (For example, can you drive your own car or travel alone on buses)? Yes Yes Yes   Do you fasten your seatbelt when you are in a car? Yes, usually Yes, usually Yes, usually   Do you exercise 20 minutes 3 or more days per week (such as walking, dancing, biking, mowing grass, swimming)? Yes, some of the time Yes, some of the time Yes, some of the time   How often do you eat food that is healthy (fruits, vegetables, lean meats) instead of unhealthy (sweets, fast food, junk food, fatty foods)? Some of the time Some of  the time Most of the time   Have your parents, brothers or sisters had any of the following problems before the age of 72? (check all that apply)   Heart problems, or hardening of the arteries;Diabetes (sugar);Alcohol or drug addiction (or abuse);Cancer;High cholesterol   How often do you have trouble taking medicines the eay you are told to take them? I always take them as prescribed I always take them as prescribed I always take them as prescribed   Do you need any help communicating with your doctors and nurses because of vision or hearing problems? No No No   During the past 12 months, have you experienced confusion or memory loss that is happening more often or is getting worse? No No No   Do you have one person you think of as your personal doctor (primary care provider or family doctor)? Yes Yes Yes   If you are seeing a Primary Care Provider (PCP) or family doctor. please list their name suzen Chaze Hruska Hosea [place] Mliss Wedin   Are you now also seeing any specialist physician(s) (such as eye doctor, foot doctor, skin doctor)? Yes Yes    If you are seeing a specialist for anything  such as foot, eye, skin, etc.  please list their name(s) dr elray dr sheralyn dr sclotta dr ginnie ong dr dwaine    dr cultrone     dr jacques    How confident are you that you can control or manage most of your health problems? Very confident Very confident Very confident       I have reviewed and updated as appropriate the past medical, family and social history. 02/15/2024 as summarized below:  Past Medical History:   Diagnosis Date    Abnormal prostate specific antigen     Anemia, unspecified     Atrial fibrillation     Candida infection     CKD (chronic kidney disease) stage 3, GFR 30-59 ml/min     Cyst of kidney, acquired     Diabetes mellitus, type 2     Diabetic peripheral neuropathy     GERD (gastroesophageal reflux disease)     Herpes simplex     Hiatal hernia     Hypogonadism in male     Hypomagnesemia     Impotence      Legal blindness     Mixed hyperlipidemia     Obesity, unspecified     12/18 pt would like referral for bariatric surgery, would like to be seen by [facility] because of his hx of transplant    Obstructive sleep apnea syndrome     4/18 compliant with CPAP, using nasal PILLOW MILD OSA. 12/17 MILD, WILL FOLLOW UP WITH SLEEP MED FOR CPAP MAY  BE BECAUSE FATIGUE    Peptic ulcer     9/18 seen by gen surg/Reese, EGD and sono pending gastric ulcer, no neoplasm, H pylori negative 3/18 will need to decide on f/u EGD with Dr. ANASTASIA    Polyp of colon     Renal transplant recipient     Testicular hypofunction     5/18 followed by urology, they will follow PSA and testosterone  levels prescribed gel 5/18 PSA 1.4 (slightly lower than last) followed by urology 5/18 PSA velocity increased form 2016-2017, testosterone  held, was to have repeat PSA/VitD and T levels in our clinic and CC to Dr. Elmer Right testicular hypotrophy    Tobacco dependence syndrome     Type 2 diabetes mellitus without complications     Unspecified glaucoma(365.9)     Vitamin D  deficiency      Past Surgical History:   Procedure Laterality Date    Colonoscopy      Debridement  foot Right     Eye surgery      Foot surgery      Hx hernia repair      Hx renal transplant      Hx upper endoscopy      Kidney surgery      Other surgical history      Shoulder surgery      Surgery of lip       Current Outpatient Medications   Medication Sig    acetaminophen  (TYLENOL ) 500 mg Oral Tablet Take 1 Tablet (500 mg total) by mouth Every 4 hours as needed for Pain    allopurinoL  (ZYLOPRIM ) 100 mg Oral Tablet Take 1 Tablet (100 mg total) by mouth Daily    amLODIPine  (NORVASC ) 5 mg Oral Tablet Take 1 Tablet (5 mg total) by mouth Twice daily    aspirin  (ECOTRIN) 81 mg Oral Tablet, Delayed Release (E.C.) Take 1 Tablet (81 mg total) by mouth    aspirin  (ECOTRIN) 81 mg Oral Tablet,  Delayed Release (E.C.) Take 1 Tablet (81 mg total) by mouth Daily    atenoloL  (TENORMIN ) 50 mg Oral Tablet  Take 1 Tablet (50 mg total) by mouth Twice daily    atropine  (ISOPTO ATROPINE ) 1 % Ophthalmic Drops 1 Drop Four times a day    carvediloL  (COREG ) 25 mg Oral Tablet Take 1 Tablet (25 mg total) by mouth Twice daily Take with food.    cholecalciferol , vitamin D3, 1,250 mcg (50,000 unit) Oral Capsule Take 1 Capsule (50,000 Units total) by mouth Every 7 days    cloNIDine  HCL (CATAPRES ) 0.1 mg Oral Tablet Take 1 Tablet (0.1 mg total) by mouth Twice daily    CREON  24,000-76,000 -120,000 unit Oral Capsule, Delayed Release(E.C.) Take 2 Capsules by mouth Three times daily with meals    CREON  36,000-114,000- 180,000 unit Oral Capsule, Delayed Release(E.C.) TAKE TWO CAPSULES BY MOUTH THREE TIMES A DAY WITH MEALS    docusate sodium (COLACE) 100 mg Oral Capsule Take 1 Capsule (100 mg total) by mouth    dorzolamide -timoloL  (COSOPT ) 22.3-6.8 mg/mL Ophthalmic Drops Administer 1 Drop into the left eye Twice daily    empagliflozin  (JARDIANCE ) 10 mg Oral Tablet Take 1 Tablet (10 mg total) by mouth Daily    ergocalciferol , vitamin D2, (DRISDOL ) 1,250 mcg (50,000 unit) Oral Capsule Take 1 Capsule (50,000 Units total) by mouth Every 7 days    famotidine  (PEPCID ) 40 mg Oral Tablet Take 1 Tablet (40 mg total) by mouth Twice daily    fenofibrate  (LOFIBRA) 160 mg Oral Tablet Take 1 Tablet (160 mg total) by mouth Daily    furosemide  (LASIX ) 40 mg Oral Tablet Take 1 Tablet (40 mg total) by mouth Twice daily    gabapentin  (NEURONTIN ) 800 mg Oral Tablet Take 1 Tablet (800 mg total) by mouth Three times a day    hydroCHLOROthiazide  (HYDRODIURIL ) 25 mg Oral Tablet Take 1 Tablet (25 mg total) by mouth Daily    insulin  aspart U-100 (NOVOLOG  FLEXPEN U-100 INSULIN ) 100 unit/mL (3 mL) Subcutaneous Insulin  Pen Inject 120 Units under the skin Daily    insulin  glargine (LANTUS  SOLOSTAR U-100 INSULIN ) 100 unit/mL Subcutaneous Insulin  Pen Inject 110 Units under the skin Daily INJECT UNDER THE SKIN 110 units daily    iron  ps complex-B12-folic acid  (POLY-IRON   150 FORTE) 150-25-1 mg-mcg-mg Oral Capsule TAKE TWO CAPSULES BY MOUTH EVERY DAY    magnesium  oxide (MAG-OX) 400 mg Oral Tablet Take 1 Tablet (400 mg total) by mouth Twice daily    multivitamin with iron  Oral Tablet Take 1 Tablet by mouth Daily    mycophenolate  sodium (MYFORTIC ) 180 mg Oral Tablet, Delayed Release (E.C.) Take 3 Tablets (540 mg total) by mouth Twice daily    nystatin  (MYCOSTATIN ) 100,000 unit/gram Cream Apply topically Twice daily    pantoprazole  (PROTONIX ) 40 mg Oral Tablet, Delayed Release (E.C.) Take 1 Tablet (40 mg total) by mouth Twice daily    Pen Needle, Disposable, (BD UF MINI PEN NEEDLE) 31 gauge x 3/16 Needle USE 8TIMES A DAY    pravastatin  (PRAVACHOL ) 20 mg Oral Tablet Take 1 Tablet (20 mg total) by mouth Every night    prednisoLONE  acetate (PRED FORTE ) 1 % Ophthalmic Drops, Suspension Instill 1 Drop into both eyes Twice daily    tacrolimus  (PROGRAF ) 1 mg Oral Capsule Take 3 Capsules (3 mg total) by mouth Twice daily    testosterone  (ANDROGEL ) 20.25 mg/1.25 gram (1.62 %) Transdermal Gel in Metered-dose Pump Place 20.25 mg on the skin Once a day    traZODone  (  DESYREL ) 50 mg Oral Tablet TAKE ONE TABLET BY MOUTH EVERY NIGHT    vitamin E 400 unit Oral Capsule Take 1 Capsule (400 Units total) by mouth Once a day     Family Medical History:       Problem Relation (Age of Onset)    COPD Father    Diabetes type II Mother, Father    Other Maternal Grandfather            Social History     Socioeconomic History    Marital status: Single   Tobacco Use    Smoking status: Never    Smokeless tobacco: Current     Types: Snuff   Vaping Use    Vaping status: Never Used   Substance and Sexual Activity    Alcohol use: Not Currently     Comment: rarely    Drug use: Never    Sexual activity: Not Currently     Social Determinants of Health     Financial Resource Strain: Low Risk (11/04/2021)    Financial Resource Strain     SDOH Financial: No   Transportation Needs: No Transportation Needs (07/31/2023)    Received  from [facility]    Transportation     In the past 12 months, has lack of reliable transportation kept you from medical appointments, meetings, work or from getting things needed for daily living? : No   Social Connections: Medium Risk (11/04/2021)    Social Connections     SDOH Social Isolation: 3 to 5 times a week   Intimate Partner Violence: Low Risk (11/04/2021)    Intimate Partner Violence     SDOH Domestic Violence: No   Housing Stability: Low Risk  (07/31/2023)    Received from [employer] Stability Vital Sign     What is your living situation today?: I have a steady place to live     Think about the place you live. Do you have problems with any of the following? Choose all that apply:: None/None on this list   Health Literacy: Medium Risk (12/22/2022)    Health Literacy     SDOH Health Literacy: Occasionally   Employment Status: High Risk (11/04/2021)    Employment Status     SDOH Employment: Unemployed         List of Current Health Care Providers   Care Team       PCP       Name Type Specialty Phone Number    [place], Suzen DELENA RIGGERS Physician Assistant PHYSICIAN ASSISTANT [phone]              Care Team       No care team found                      Health Maintenance   Topic Date Due    Colonoscopy  Never done    Influenza Vaccine (1) 03/16/2024 (Originally 01/04/2024)    Covid-19 Vaccine (7 - 2025-26 season) 05/15/2024 (Originally 01/04/2024)    Shingles Vaccine (2 of 2) 02/14/2025 (Originally 10/08/2022)    Pneumococcal Vaccination, Age 53+ (1 of 2 - PCV) 02/14/2025 (Originally 08/18/1983)    Diabetic A1C  03/01/2024    Prostate Cancer Screening  08/27/2024    Diabetic Kidney Health Microalb/Cr Ratio  08/30/2024    Diabetic Kidney Health eGFR  10/01/2024    Diabetic Retinal Exam  11/09/2024    Depression Screening  02/14/2025  Medicare Annual Wellness Visit  02/14/2025    Adult Tdap-Td (2 - Td or Tdap) 06/04/2026    Osteoporosis screening  09/04/2032    Hepatitis B Vaccine  Discontinued     Hepatitis C screening  Discontinued    HIV Screening  Discontinued     Medicare Wellness Assessment   Medicare initial or wellness physical in the last year?: No  Advance Directives   Does patient have a living will or MPOA: Yes   Has patient provided [employer] with a copy?: No   Patient advised to bring in copy.: Yes          Activities of Daily Living   Do you need help with dressing, bathing, or walking?: No   Do you need help with shopping, housekeeping, medications, or finances?: No   Do you have rugs in hallways, broken steps, or poor lighting?: No   Do you have grab bars in your bathroom, non-slip strips in your tub, and hand rails on your stairs?: Yes   Cognitive Function Screen (1=Yes, 0=No)   What is you age?: Correct   What is the time to the nearest hour?: Correct   What is the year?: Correct   What is the name of this clinic?: Correct   Can the patient recognize two persons (the doctor, the nurse, home help, etc.)?: Correct   What is the date of your birth? (day and month sufficient) : Correct   In what year did World War II end?: Correct   Who is the current president of the [place] ?: Correct   Count from 20 down to 1?: Correct   What address did I give you earlier?: Correct   Total Score: 10   Interpretation of Total Score: Greater than 6 Normal   Fall Risk Screen   Do you feel unsteady when standing or walking?: No  Do you worry about falling?: No  Have you fallen in the past year?: No   Depression Screen     Little interest or pleasure in doing things.: Not at all  Feeling down, depressed, or hopeless: Not at all  PHQ 2 Total: 0     Pain Score   Pain Score:   0 - No pain    Substance Use-Abuse Screening     Tobacco Use     In Past 12 MONTHS, how often have you used any tobacco product (for example, cigarettes, e-cigarettes, cigars, pipes, or smokeless tobacco)?: Never     Alcohol use     In the PAST 12 MONTHS, how often have you had 5 (men)/4 (women) or more drinks containing alcohol in  one day?: Never     Prescription Drug Use     In the PAST 12 months, how often have you used any prescription medications just for the feeling, more than prescribed, or that were not prescribed for you? Prescriptions may include: opioids, benzodiazepines, medications for ADHD: Never           Illicit Drug Use   In the PAST 12 MONTHS, how often have you used any drugs, including marijuana, cocaine or crack, heroin, methamphetamine, hallucinogens, ecstasy/MDMA?: Never                  OBJECTIVE:   Exam  Const  General: cooperative, no acute distress and alert  Resp  Effort & Inspection: able to speak in complete sentences  Psych  Mental Status: mental status grossly normal  Mood: congruent mood  Affect: normal affect  Attitude: cooperative  Insight: insight good  Judgment: judgment good              REVIEW OF SYSTEMS:   Review of Systems  General: No fever.  No chills.  No weight changes.  HEENT: No vision changes.  Cardiac: No chest pain. No palpitations.  No dizziness.  No light-headedness.  No near syncope.  Resp: No dyspnea at rest, no dyspnea on exertion; no cough or hemoptysis; no orthopnea or PND.  GI: No N/V. No melena.  No bright red blood per rectum.  Ext: No edema.  No claudication.  Neuro: No focal weakness.  No numbness.  All other ROS negative.      Objective: There were no vitals taken for this visit.                 Lab Results   Component Value Date    CHOLESTEROL 162 06/11/2023    HDLCHOL 38 (L) 06/11/2023    LDLCHOL 75 06/11/2023    TRIG 246 (H) 06/11/2023       COMPLETE BLOOD COUNT   Lab Results   Component Value Date    WBC 8.1 08/26/2023    HGB 10.0 (L) 08/26/2023    HCT 31.6 (L) 08/26/2023    PLTCNT 234 08/26/2023    BANDS 3 (L) 09/09/2021       DIFFERENTIAL  Lab Results   Component Value Date    PMNS 66 06/11/2023    LYMPHOCYTES 21 06/11/2023    MYELOCYTES 3 09/08/2021    MONOCYTES 9 06/11/2023    EOSINOPHIL 3 06/11/2023    BASOPHILS 1 06/11/2023    BASOPHILS 0.00 06/11/2023    PMNABS 3.30  06/11/2023    LYMPHSABS 1.10 06/11/2023    EOSABS 0.20 06/11/2023    MONOSABS 0.50 06/11/2023        COMPREHENSIVE METABOLIC PANEL  Lab Results   Component Value Date    SODIUM 139 10/02/2023    POTASSIUM 3.9 10/02/2023    CHLORIDE 98 10/02/2023    CO2 31 10/02/2023    ANIONGAP 10 10/02/2023    BUN 76 (H) 10/02/2023    CREATININE 3.87 (H) 10/02/2023    GLUCOSENF 76 10/02/2023    GLUCOSE Negative 08/26/2023    CALCIUM 9.5 10/02/2023    PHOSPHORUS 3.0 (L) 02/09/2023    ALBUMIN 4.2 10/02/2023    TOTALPROTEIN 6.9 10/02/2023    ALKPHOS 35 10/02/2023    AST 19 10/02/2023    ALT 20 10/02/2023    GFR 17 (L) 10/02/2023          THYROID  STIMULATING HORMONE  Lab Results   Component Value Date    TSH 2.750 06/11/2023        Lab Results   Component Value Date    HA1C 6.0 08/31/2023     Lab Results   Component Value Date    VITD 46 05/09/2022          Assessment & Plan  Medicare annual wellness visit, subsequent    Type 2 diabetes mellitus without complication, with long-term current use of insulin     CKD (chronic kidney disease) stage 4, GFR 15-29 ml/min (CMS HCC)    Gastroesophageal reflux disease without esophagitis    BPH associated with nocturia    Renal transplant recipient    Mixed hyperlipidemia       Tobacco cessation counseling performed.  Occ snuff  discussed cessation      Depression screening is negative. PHQ 2 Total: 0  Health Maintenance Due   Topic Date Due    Colonoscopy  Never done      ASSESSMENT & PLAN:   Assessment/Plan   1. Medicare annual wellness visit, subsequent       Identified Risk Factors/ Recommended Actions   Tobacco cessation counseling performed.     The PHQ 2 Total: 0 depression screen is interpreted as negative.    Patient declined Advanced Directives information.  Pt has one at [facility]   will get copy        The patient has been educated about risk factors and recommended preventive care. Written Prevention Plan completed/ updated and given to patient (see  After Visit Summary).    Meds reviewed as well as labs.  Chart reviewed and updated.   Continue current treatment.  Keep follow-up appointment.   BS 180 - 210   10 shot for  Vaccine hx reviewed.  Flu shot needed   38 units lantus    30 Novolog    am   Mid day  Novolog   sliding scale     54 units lantus   night  and   40 units Novolog   pm    This am   BS 126      co  increase in numbness in  feet     Pt cooks and  has been trying to eat   better    Pt will come for labs than appt fu on  Nov  3 rd       TELEMEDICINE DOCUMENTATION:    Patient Location:  Home    Patient/family aware of provider location:  yes  Patient/family consent for telemedicine:  yes  Examination observed and performed by:  Suzen DELENA Asa, PA-C     Time spent on phone with pt 28 min   Suzen DELENA Asa, PA-C

## 2024-02-18 ENCOUNTER — Other Ambulatory Visit (INDEPENDENT_AMBULATORY_CARE_PROVIDER_SITE_OTHER): Payer: Self-pay | Admitting: Internal Medicine

## 2024-02-18 MED ORDER — INSULIN ASPART (U-100) 100 UNIT/ML (3 ML) SUBCUTANEOUS PEN: 120.0000 [IU] | PEN_INJECTOR | Freq: Every day | SUBCUTANEOUS | 3 refills | Status: DC

## 2024-02-22 ENCOUNTER — Ambulatory Visit: Attending: Internal Medicine | Admitting: Internal Medicine

## 2024-02-22 ENCOUNTER — Telehealth (INDEPENDENT_AMBULATORY_CARE_PROVIDER_SITE_OTHER): Payer: Self-pay | Admitting: Internal Medicine

## 2024-02-22 DIAGNOSIS — J069 Acute upper respiratory infection, unspecified: Secondary | ICD-10-CM | POA: Insufficient documentation

## 2024-02-22 DIAGNOSIS — J029 Acute pharyngitis, unspecified: Secondary | ICD-10-CM | POA: Insufficient documentation

## 2024-02-22 MED ORDER — AMOXICILLIN 875 MG-POTASSIUM CLAVULANATE 125 MG TABLET
1.0000 | ORAL_TABLET | Freq: Two times a day (BID) | ORAL | 0 refills | Status: AC
Start: 2024-02-22 — End: 2024-03-03

## 2024-02-22 MED ORDER — PROMETHAZINE-DM 6.25 MG-15 MG/5 ML ORAL SYRUP
5.0000 mL | ORAL_SOLUTION | Freq: Four times a day (QID) | ORAL | 1 refills | Status: AC | PRN
Start: 2024-02-22 — End: ?

## 2024-02-22 NOTE — Progress Notes (Signed)
 [facility]ICINE[facility]  [address]  [place] [address]  Operated by [facility]  Telephone Visit    Name:  Martin Porter MRN: [id number]   Date:  02/22/2024 DOB: Apr 01, 1966 (58 y.o.)          The patient/family initiated a request for telephone service.  Verbal consent for this service was obtained from the patient/family.  Reason for audio only:Patient preference    Last office visit in this department: 12/07/2023      Reason for call:Sore Throat (Cough congestion    Pt not feeling well )       HPI  Acute  cough congestion  productive of  green sputum with sore throat      No fever or chills  No nvd     Pt says he does not feel well   Pt  travels by bus  and can not come in       Allergies[1]     acetaminophen  (TYLENOL ) 500 mg Oral Tablet, Take 1 Tablet (500 mg total) by mouth Every 4 hours as needed for Pain  allopurinoL  (ZYLOPRIM ) 100 mg Oral Tablet, Take 1 Tablet (100 mg total) by mouth Daily  amLODIPine  (NORVASC ) 5 mg Oral Tablet, Take 1 Tablet (5 mg total) by mouth Twice daily  aspirin  (ECOTRIN) 81 mg Oral Tablet, Delayed Release (E.C.), Take 1 Tablet (81 mg total) by mouth  aspirin  (ECOTRIN) 81 mg Oral Tablet, Delayed Release (E.C.), Take 1 Tablet (81 mg total) by mouth Daily  atenoloL  (TENORMIN ) 50 mg Oral Tablet, Take 1 Tablet (50 mg total) by mouth Twice daily  atropine  (ISOPTO ATROPINE ) 1 % Ophthalmic Drops, 1 Drop Four times a day  carvediloL  (COREG ) 25 mg Oral Tablet, Take 1 Tablet (25 mg total) by mouth Twice daily Take with food.  cholecalciferol , vitamin D3, 1,250 mcg (50,000 unit) Oral Capsule, Take 1 Capsule (50,000 Units total) by mouth Every 7 days  cloNIDine  HCL (CATAPRES ) 0.1 mg Oral Tablet, Take 1 Tablet (0.1 mg total) by mouth Twice daily  CREON  24,000-76,000 -120,000 unit Oral Capsule, Delayed Release(E.C.), Take 2 Capsules by mouth Three times daily with meals  CREON  36,000-114,000- 180,000 unit Oral Capsule, Delayed Release(E.C.), TAKE TWO CAPSULES BY  MOUTH THREE TIMES A DAY WITH MEALS  docusate sodium (COLACE) 100 mg Oral Capsule, Take 1 Capsule (100 mg total) by mouth  dorzolamide -timoloL  (COSOPT ) 22.3-6.8 mg/mL Ophthalmic Drops, Administer 1 Drop into the left eye Twice daily  empagliflozin  (JARDIANCE ) 10 mg Oral Tablet, Take 1 Tablet (10 mg total) by mouth Daily  ergocalciferol , vitamin D2, (DRISDOL ) 1,250 mcg (50,000 unit) Oral Capsule, Take 1 Capsule (50,000 Units total) by mouth Every 7 days  famotidine  (PEPCID ) 40 mg Oral Tablet, Take 1 Tablet (40 mg total) by mouth Twice daily  fenofibrate  (LOFIBRA) 160 mg Oral Tablet, Take 1 Tablet (160 mg total) by mouth Daily  furosemide  (LASIX ) 40 mg Oral Tablet, Take 1 Tablet (40 mg total) by mouth Twice daily  gabapentin  (NEURONTIN ) 800 mg Oral Tablet, Take 1 Tablet (800 mg total) by mouth Three times a day  hydroCHLOROthiazide  (HYDRODIURIL ) 25 mg Oral Tablet, Take 1 Tablet (25 mg total) by mouth Daily  insulin  aspart U-100 (NOVOLOG  FLEXPEN U-100 INSULIN ) 100 unit/mL (3 mL) Subcutaneous Insulin  Pen, Inject 120 Units under the skin Daily  insulin  glargine (LANTUS  SOLOSTAR U-100 INSULIN ) 100 unit/mL Subcutaneous Insulin  Pen, Inject 110 Units under the skin Daily INJECT UNDER THE SKIN 110 units daily  iron  ps complex-B12-folic acid  (  POLY-IRON  150 FORTE) 150-25-1 mg-mcg-mg Oral Capsule, TAKE TWO CAPSULES BY MOUTH EVERY DAY  magnesium  oxide (MAG-OX) 400 mg Oral Tablet, Take 1 Tablet (400 mg total) by mouth Twice daily  multivitamin with iron  Oral Tablet, Take 1 Tablet by mouth Daily  mycophenolate  sodium (MYFORTIC ) 180 mg Oral Tablet, Delayed Release (E.C.), Take 3 Tablets (540 mg total) by mouth Twice daily  nystatin  (MYCOSTATIN ) 100,000 unit/gram Cream, Apply topically Twice daily  pantoprazole  (PROTONIX ) 40 mg Oral Tablet, Delayed Release (E.C.), Take 1 Tablet (40 mg total) by mouth Twice daily  Pen Needle, Disposable, (BD UF MINI PEN NEEDLE) 31 gauge x 3/16 Needle, USE 8TIMES A DAY  pravastatin  (PRAVACHOL ) 20 mg  Oral Tablet, Take 1 Tablet (20 mg total) by mouth Every night  prednisoLONE  acetate (PRED FORTE ) 1 % Ophthalmic Drops, Suspension, Instill 1 Drop into both eyes Twice daily  tacrolimus  (PROGRAF ) 1 mg Oral Capsule, Take 3 Capsules (3 mg total) by mouth Twice daily  testosterone  (ANDROGEL ) 20.25 mg/1.25 gram (1.62 %) Transdermal Gel in Metered-dose Pump, Place 20.25 mg on the skin Once a day  traZODone  (DESYREL ) 50 mg Oral Tablet, TAKE ONE TABLET BY MOUTH EVERY NIGHT  vitamin E 400 unit Oral Capsule, Take 1 Capsule (400 Units total) by mouth Once a day    No facility-administered medications prior to visit.       ROS     OBJECTIVE:    Exam  Const  General: cooperative, no acute distress and alert  Resp  Effort & Inspection: able to speak in complete sentences  Psych  Mental Status: mental status grossly normal  Mood: congruent mood  Affect: normal affect  Attitude: cooperative  Insight: insight good  Judgment: judgment good      ICD-10-CM    1. Sorethroat  J02.9       2. Upper respiratory tract infection, unspecified type  J06.9           PLAN: Treatment per orders . Call or return to clinic prn if these symptoms worsen or fail to improve as anticipated.    LOS Determination: Medical Decision Making- Direct audio communication with patient was 4 minutes    Suzen DELENA Asa, PA-C       [1]   Allergies  Allergen Reactions    Strawberry

## 2024-02-23 ENCOUNTER — Other Ambulatory Visit (INDEPENDENT_AMBULATORY_CARE_PROVIDER_SITE_OTHER): Payer: Self-pay | Admitting: Internal Medicine

## 2024-02-23 ENCOUNTER — Other Ambulatory Visit: Payer: Self-pay

## 2024-02-23 MED ORDER — FUROSEMIDE 40 MG TABLET
40.0000 mg | ORAL_TABLET | Freq: Two times a day (BID) | ORAL | 2 refills | Status: AC
Start: 2024-02-23 — End: ?

## 2024-03-07 ENCOUNTER — Other Ambulatory Visit: Payer: Self-pay

## 2024-03-07 ENCOUNTER — Ambulatory Visit: Payer: Self-pay | Attending: Internal Medicine | Admitting: Internal Medicine

## 2024-03-07 ENCOUNTER — Encounter (INDEPENDENT_AMBULATORY_CARE_PROVIDER_SITE_OTHER): Payer: Self-pay | Admitting: Internal Medicine

## 2024-03-07 VITALS — BP 160/80 | HR 72 | Ht 72.0 in | Wt 312.8 lb

## 2024-03-07 DIAGNOSIS — K219 Gastro-esophageal reflux disease without esophagitis: Secondary | ICD-10-CM | POA: Insufficient documentation

## 2024-03-07 DIAGNOSIS — Z0000 Encounter for general adult medical examination without abnormal findings: Secondary | ICD-10-CM | POA: Insufficient documentation

## 2024-03-07 DIAGNOSIS — B379 Candidiasis, unspecified: Secondary | ICD-10-CM | POA: Insufficient documentation

## 2024-03-07 DIAGNOSIS — Z7984 Long term (current) use of oral hypoglycemic drugs: Secondary | ICD-10-CM

## 2024-03-07 DIAGNOSIS — E1142 Type 2 diabetes mellitus with diabetic polyneuropathy: Secondary | ICD-10-CM | POA: Insufficient documentation

## 2024-03-07 DIAGNOSIS — E782 Mixed hyperlipidemia: Secondary | ICD-10-CM | POA: Insufficient documentation

## 2024-03-07 DIAGNOSIS — Z1211 Encounter for screening for malignant neoplasm of colon: Secondary | ICD-10-CM | POA: Insufficient documentation

## 2024-03-07 DIAGNOSIS — N184 Chronic kidney disease, stage 4 (severe): Secondary | ICD-10-CM | POA: Insufficient documentation

## 2024-03-07 DIAGNOSIS — Z794 Long term (current) use of insulin: Secondary | ICD-10-CM | POA: Insufficient documentation

## 2024-03-07 DIAGNOSIS — N401 Enlarged prostate with lower urinary tract symptoms: Secondary | ICD-10-CM | POA: Insufficient documentation

## 2024-03-07 DIAGNOSIS — E119 Type 2 diabetes mellitus without complications: Secondary | ICD-10-CM | POA: Insufficient documentation

## 2024-03-07 DIAGNOSIS — Z940 Kidney transplant status: Secondary | ICD-10-CM | POA: Insufficient documentation

## 2024-03-07 DIAGNOSIS — I480 Paroxysmal atrial fibrillation: Secondary | ICD-10-CM | POA: Insufficient documentation

## 2024-03-07 DIAGNOSIS — R351 Nocturia: Secondary | ICD-10-CM | POA: Insufficient documentation

## 2024-03-07 LAB — COMPREHENSIVE METABOLIC PNL, FASTING
ALBUMIN/GLOBULIN RATIO: 1.5 — ABNORMAL HIGH (ref 0.8–1.4)
ALBUMIN: 4.3 g/dL (ref 3.5–5.7)
ALKALINE PHOSPHATASE: 46 U/L (ref 34–104)
ALT (SGPT): 28 U/L (ref 7–52)
ANION GAP: 8 mmol/L (ref 4–13)
AST (SGOT): 23 U/L (ref 13–39)
BILIRUBIN TOTAL: 0.5 mg/dL (ref 0.3–1.0)
BUN/CREA RATIO: 17 (ref 6–22)
BUN: 52 mg/dL — ABNORMAL HIGH (ref 7–25)
CALCIUM, CORRECTED: 9.7 mg/dL (ref 8.9–10.8)
CALCIUM: 9.9 mg/dL (ref 8.6–10.3)
CHLORIDE: 101 mmol/L (ref 98–107)
CO2 TOTAL: 29 mmol/L (ref 21–31)
CREATININE: 3.07 mg/dL — ABNORMAL HIGH (ref 0.60–1.30)
ESTIMATED GFR: 23 mL/min/1.73mˆ2 — ABNORMAL LOW (ref >59)
GLOBULIN: 2.9 (ref 2.0–3.5)
GLUCOSE: 63 mg/dL — ABNORMAL LOW (ref 74–109)
OSMOLALITY, CALCULATED: 288 mosm/kg (ref 270–290)
POTASSIUM: 3.7 mmol/L (ref 3.5–5.1)
PROTEIN TOTAL: 7.2 g/dL (ref 6.4–8.9)
SODIUM: 138 mmol/L (ref 136–145)

## 2024-03-07 LAB — CBC WITH DIFF
BASOPHIL #: 0 x10ˆ3/uL (ref 0.00–0.10)
BASOPHIL %: 0 % (ref 0–1)
EOSINOPHIL #: 0.2 x10ˆ3/uL (ref 0.00–0.60)
EOSINOPHIL %: 2 % (ref 1–8)
HCT: 32.2 % — ABNORMAL LOW (ref 36.7–47.1)
HGB: 11 g/dL — ABNORMAL LOW (ref 12.5–16.3)
LYMPHOCYTE #: 1.1 x10ˆ3/uL (ref 1.00–3.00)
LYMPHOCYTE %: 16 % (ref 15–43)
MCH: 31.6 pg (ref 23.8–33.4)
MCHC: 34 g/dL (ref 32.5–36.3)
MCV: 92.8 fL (ref 73.0–96.2)
MONOCYTE #: 0.5 x10ˆ3/uL (ref 0.30–1.10)
MONOCYTE %: 8 % (ref 6–14)
MPV: 10.1 fL (ref 7.4–11.4)
NEUTROPHIL #: 5.2 x10ˆ3/uL (ref 1.85–7.84)
NEUTROPHIL %: 74 % (ref 44–74)
PLATELETS: 291 x10ˆ3/uL (ref 140–440)
RBC: 3.47 x10ˆ6/uL — ABNORMAL LOW (ref 4.06–5.63)
RDW: 13.5 % (ref 12.1–16.2)
WBC: 7.1 x10ˆ3/uL (ref 3.6–10.2)

## 2024-03-07 LAB — PARATHYROID HORMONE (PTH): PTH: 88.1 pg/mL — ABNORMAL HIGH (ref 12.0–88.0)

## 2024-03-07 LAB — LIPID PANEL
CHOL/HDL RATIO: 4.3
CHOLESTEROL: 170 mg/dL (ref <200)
HDL CHOL: 40 mg/dL (ref >=40)
LDL CALC: 91 mg/dL (ref 0–100)
TRIGLYCERIDES: 197 mg/dL — ABNORMAL HIGH (ref <=150)
VLDL CALC: 39 mg/dL (ref 0–50)

## 2024-03-07 LAB — MICROALBUMIN/CREATININE RATIO, URINE, RANDOM
CREATININE RANDOM URINE: 59 mg/dL (ref 30–125)
MICROALBUMIN RANDOM URINE: 3.7 mg/dL
MICROALBUMIN/CREATININE RATIO RANDOM URINE: 62.7 mg/g

## 2024-03-07 LAB — URIC ACID: URIC ACID: 11.1 mg/dL — ABNORMAL HIGH (ref 2.3–7.6)

## 2024-03-07 LAB — VITAMIN D 25 TOTAL: VITAMIN D 25, TOTAL: 94.03 ng/mL (ref 30.00–100.00)

## 2024-03-07 LAB — MAGNESIUM: MAGNESIUM: 2.1 mg/dL (ref 1.9–2.7)

## 2024-03-07 LAB — THYROID STIMULATING HORMONE (SENSITIVE TSH): TSH: 3.02 u[IU]/mL (ref 0.450–5.330)

## 2024-03-07 LAB — HGA1C (HEMOGLOBIN A1C WITH EST AVG GLUCOSE): HEMOGLOBIN A1C: 6.1 % — ABNORMAL HIGH (ref 4.0–6.0)

## 2024-03-07 MED ORDER — PEN NEEDLE, DIABETIC 31 GAUGE X 3/16"
2 refills | Status: AC
Start: 2024-03-07 — End: ?

## 2024-03-07 MED ORDER — ALLOPURINOL 100 MG TABLET: 100.0000 mg | ORAL_TABLET | Freq: Every day | ORAL | 2 refills | Status: DC

## 2024-03-07 MED ORDER — HYDROCHLOROTHIAZIDE 25 MG TABLET
25.0000 mg | ORAL_TABLET | Freq: Every day | ORAL | 3 refills | Status: AC
Start: 2024-03-07 — End: ?

## 2024-03-07 MED ORDER — NYSTATIN 100,000 UNIT/GRAM TOPICAL POWDER
Freq: Two times a day (BID) | CUTANEOUS | 3 refills | Status: AC
Start: 2024-03-07 — End: ?

## 2024-03-07 MED ORDER — EMPAGLIFLOZIN 10 MG TABLET
10.0000 mg | ORAL_TABLET | Freq: Every day | ORAL | 2 refills | Status: AC
Start: 2024-03-07 — End: ?

## 2024-03-07 MED ORDER — FENOFIBRATE 160 MG TABLET
160.0000 mg | ORAL_TABLET | Freq: Every day | ORAL | 4 refills | Status: AC
Start: 2024-03-07 — End: ?

## 2024-03-07 MED ORDER — ATENOLOL 50 MG TABLET
50.0000 mg | ORAL_TABLET | Freq: Two times a day (BID) | ORAL | 4 refills | Status: AC
Start: 2024-03-07 — End: ?

## 2024-03-07 MED ORDER — POTASSIUM CHLORIDE ER 10 MEQ TABLET,EXTENDED RELEASE(PART/CRYST)
10.0000 meq | ORAL_TABLET | Freq: Two times a day (BID) | ORAL | 4 refills | Status: AC
Start: 2024-03-07 — End: ?

## 2024-03-07 MED ORDER — MAGNESIUM OXIDE 400 MG (241.3 MG MAGNESIUM) TABLET
400.0000 mg | ORAL_TABLET | Freq: Two times a day (BID) | ORAL | 3 refills | Status: AC
Start: 2024-03-07 — End: ?

## 2024-03-07 MED ORDER — FUROSEMIDE 40 MG TABLET
40.0000 mg | ORAL_TABLET | Freq: Two times a day (BID) | ORAL | 2 refills | Status: AC
Start: 2024-03-07 — End: ?

## 2024-03-07 MED ORDER — FAMOTIDINE 40 MG TABLET
40.0000 mg | ORAL_TABLET | Freq: Two times a day (BID) | ORAL | 4 refills | Status: AC
Start: 2024-03-07 — End: ?

## 2024-03-07 MED ORDER — CLONIDINE HCL 0.1 MG TABLET
0.1000 mg | ORAL_TABLET | Freq: Two times a day (BID) | ORAL | 3 refills | Status: AC
Start: 2024-03-07 — End: ?

## 2024-03-07 MED ORDER — AMLODIPINE 5 MG TABLET
5.0000 mg | ORAL_TABLET | Freq: Two times a day (BID) | ORAL | 4 refills | Status: AC
Start: 2024-03-07 — End: ?

## 2024-03-07 MED ORDER — POLY-IRON 150 FORTE 150 MG-25 MCG-1 MG CAPSULE
2.0000 | ORAL_CAPSULE | Freq: Every day | ORAL | 5 refills | Status: AC
Start: 2024-03-07 — End: ?

## 2024-03-07 MED ORDER — PANTOPRAZOLE 40 MG TABLET,DELAYED RELEASE
40.0000 mg | DELAYED_RELEASE_TABLET | Freq: Two times a day (BID) | ORAL | 4 refills | Status: AC
Start: 2024-03-07 — End: ?

## 2024-03-07 MED ORDER — LANTUS SOLOSTAR U-100 INSULIN 100 UNIT/ML (3 ML) SUBCUTANEOUS PEN: 120.0000 [IU] | PEN_INJECTOR | Freq: Every day | SUBCUTANEOUS | 3 refills | Status: DC

## 2024-03-07 NOTE — Progress Notes (Signed)
 [facility]  [address]  [place] [address]  Operated by [facility]      Name: JAHREL BORTHWICK                       Date of Birth: 07-02-1965   MRN:  [id number]                         Date of visit: 03/07/2024     PCP: Suzen DELENA Asa, PA-C     Subjective  Martin Porter is a 58 y.o. year old male who presents for Follow Up 3 Months (3 month follow up)   to clinic.  No specialty comments available.   Patient Active Problem List    Diagnosis Date Noted    CKD (chronic kidney disease) stage 4, GFR 15-29 ml/min (CMS HCC) 10/02/2023    Gout 06/05/2023    History of pancreas transplant (CMS HCC) 05/09/2022    Immunosuppression (CMS HCC) 05/09/2022    Class 3 severe obesity due to excess calories with serious comorbidity and body mass index (BMI) of 45.0 to 49.9 in adult 05/09/2022    Stable proliferative diabetic retinopathy of both eyes associated with type 2 diabetes mellitus (CMS HCC) 05/09/2022    Cellulitis 09/05/2021    Hypomagnesemia 07/15/2021    Testicular hypofunction 07/15/2021    Type 2 diabetes mellitus without complications 07/15/2021    CKD (chronic kidney disease) stage 3, GFR 30-59 ml/min 07/15/2021    Anemia, unspecified 07/15/2021    Mixed hyperlipidemia 07/15/2021    Atrial fibrillation 07/15/2021    Diabetic peripheral neuropathy 07/15/2021    GERD (gastroesophageal reflux disease) 07/15/2021    Renal transplant recipient 07/15/2021    Impotence 07/15/2021    Hypogonadism in male 07/15/2021    Obstructive sleep apnea syndrome 07/15/2021     4/18 compliant with CPAP, using nasal PILLOW MILD OSA. 12/17 MILD, WILL FOLLOW UP WITH SLEEP MED FOR CPAP MAY  BE BECAUSE FATIGUE      Candida infection 07/15/2021      Current Outpatient Medications   Medication Sig    acetaminophen  (TYLENOL ) 500 mg Oral Tablet Take 1 Tablet (500 mg total) by mouth Every 4 hours as needed for Pain    allopurinoL  (ZYLOPRIM ) 100 mg Oral Tablet Take 1 Tablet (100 mg  total) by mouth Daily    amLODIPine  (NORVASC ) 5 mg Oral Tablet Take 1 Tablet (5 mg total) by mouth Twice daily    aspirin  (ECOTRIN) 81 mg Oral Tablet, Delayed Release (E.C.) Take 1 Tablet (81 mg total) by mouth (Patient not taking: Reported on 03/07/2024)    aspirin  (ECOTRIN) 81 mg Oral Tablet, Delayed Release (E.C.) Take 1 Tablet (81 mg total) by mouth Daily    atenoloL  (TENORMIN ) 50 mg Oral Tablet Take 1 Tablet (50 mg total) by mouth Twice daily    atropine  (ISOPTO ATROPINE ) 1 % Ophthalmic Drops 1 Drop Four times a day    carvediloL  (COREG ) 25 mg Oral Tablet Take 1 Tablet (25 mg total) by mouth Twice daily Take with food.    cholecalciferol , vitamin D3, 1,250 mcg (50,000 unit) Oral Capsule Take 1 Capsule (50,000 Units total) by mouth Every 7 days    cloNIDine  HCL (CATAPRES ) 0.1 mg Oral Tablet Take 1 Tablet (0.1 mg total) by mouth Twice daily    CREON  24,000-76,000 -120,000 unit Oral Capsule, Delayed Release(E.C.) Take 2 Capsules by mouth Three times daily  with meals    CREON  36,000-114,000- 180,000 unit Oral Capsule, Delayed Release(E.C.) TAKE TWO CAPSULES BY MOUTH THREE TIMES A DAY WITH MEALS    docusate sodium (COLACE) 100 mg Oral Capsule Take 1 Capsule (100 mg total) by mouth    dorzolamide -timoloL  (COSOPT ) 22.3-6.8 mg/mL Ophthalmic Drops Administer 1 Drop into the left eye Twice daily    empagliflozin  (JARDIANCE ) 10 mg Oral Tablet Take 1 Tablet (10 mg total) by mouth Daily    ergocalciferol , vitamin D2, (DRISDOL ) 1,250 mcg (50,000 unit) Oral Capsule Take 1 Capsule (50,000 Units total) by mouth Every 7 days    famotidine  (PEPCID ) 40 mg Oral Tablet Take 1 Tablet (40 mg total) by mouth Twice daily    fenofibrate  (LOFIBRA) 160 mg Oral Tablet Take 1 Tablet (160 mg total) by mouth Daily    furosemide  (LASIX ) 40 mg Oral Tablet Take 1 Tablet (40 mg total) by mouth Twice daily    gabapentin  (NEURONTIN ) 800 mg Oral Tablet Take 1 Tablet (800 mg total) by mouth Three times a day    hydroCHLOROthiazide  (HYDRODIURIL ) 25 mg  Oral Tablet Take 1 Tablet (25 mg total) by mouth Daily    insulin  aspart U-100 (NOVOLOG  FLEXPEN U-100 INSULIN ) 100 unit/mL (3 mL) Subcutaneous Insulin  Pen Inject 120 Units under the skin Daily    insulin  glargine (LANTUS  SOLOSTAR U-100 INSULIN ) 100 unit/mL Subcutaneous Insulin  Pen Inject 110 Units under the skin Daily INJECT UNDER THE SKIN 110 units daily    iron  ps complex-B12-folic acid  (POLY-IRON  150 FORTE) 150-25-1 mg-mcg-mg Oral Capsule TAKE TWO CAPSULES BY MOUTH EVERY DAY    magnesium  oxide (MAG-OX) 400 mg Oral Tablet Take 1 Tablet (400 mg total) by mouth Twice daily    multivitamin with iron  Oral Tablet Take 1 Tablet by mouth Daily    mycophenolate  sodium (MYFORTIC ) 180 mg Oral Tablet, Delayed Release (E.C.) Take 3 Tablets (540 mg total) by mouth Twice daily    nystatin  (MYCOSTATIN ) 100,000 unit/gram Cream Apply topically Twice daily    pantoprazole  (PROTONIX ) 40 mg Oral Tablet, Delayed Release (E.C.) Take 1 Tablet (40 mg total) by mouth Twice daily    Pen Needle, Disposable, (BD UF MINI PEN NEEDLE) 31 gauge x 3/16 Needle USE 8TIMES A DAY    polysaccharide iron  complex (FERREX 150) 150 mg iron  Oral Capsule Take 1 Capsule (150 mg total) by mouth Daily    potassium chloride (K-DUR) 10 mEq Oral Tab Sust.Rel. Particle/Crystal Take 1 Tablet (10 mEq total) by mouth Twice daily    pravastatin  (PRAVACHOL ) 20 mg Oral Tablet Take 1 Tablet (20 mg total) by mouth Every night    prednisoLONE  acetate (PRED FORTE ) 1 % Ophthalmic Drops, Suspension Instill 1 Drop into both eyes Twice daily    promethazine -dextromethorphan (PHENERGAN -DM) 6.25-15 mg/5 mL Oral Syrup Take 5 mL by mouth Four times a day as needed for Cough (Patient not taking: Reported on 03/07/2024)    tacrolimus  (PROGRAF ) 1 mg Oral Capsule Take 3 Capsules (3 mg total) by mouth Twice daily    testosterone  (ANDROGEL ) 20.25 mg/1.25 gram (1.62 %) Transdermal Gel in Metered-dose Pump Place 20.25 mg on the skin Once a day    traZODone  (DESYREL ) 50 mg Oral Tablet TAKE  ONE TABLET BY MOUTH EVERY NIGHT    vitamin E 400 unit Oral Capsule Take 1 Capsule (400 Units total) by mouth Once a day        Chronic Disease Management-AMB  Fu visit    Co stinky sweat on and off  Co feet need checked           This pt  is for  fu today  of chronic problems  fu BS     Patient says he feels good today less low blood sugars  Pt still on frestyle libre  link  and will try to up grade    to [place]   3   will call his company that he gets it from             FU basal cell back  and   s/p upper lip  doing radiation   16 total  treatments        Better but still with dehydration     Co insomnia  pt  says  he can't sleep well  pt  says nothing is bothering him   he takes Neurontin  at  8 pm  we discussed  moving the time    Somewhat improved with  trazodone      fu ckd  3  sees dr estevan  fu  1 year fu    hx of  transplant  dec   2005     and  [place]    Appts  utd         needs fu with  dr estevan      abdominal pain  9/18 EGD Dr. Ilah Ezzard Sages, antral ulcer 2016 nl HIDA 2017 nl abd ultrasound x for FLD    Anemia   fu  labs   h/h stable      atrial fibrillation  pt reports that took coumadin after transplant and recalls 2 yrs of heparin  before hand when on dialysis. Pt thinks took coumadin for 9-12 mo after transplant but pt does not recal  Nothing  done  since        cyst of kidney  1/20 pt contacted transplat team, they are not concerned about the 8 cm cyst at the time, pt scheduled for fu for Feb 5th 1/20 8 cm seen on transplanted kidney, sending to transplant team, need to verify that this is not worrisome    diabetes mellitus type 2  Pt has been in  Great control but  Too much control  Pt's Aic 5.9%  Pt and I discussed how detrimental low BS can be   Insulin  needs to be adjusted   Pt brings up medication oral agents he has been on in the past and he would like to be off insulin  and oral agents only   However as he has had a renal transplant and can not take medications he was on in  the past       No recent low  BS      diabetic peripheral neuropathy    essential hypertension  bp mild increase but at radiation  it is high   will add  clonidine    0.1   bid  and monitor        gastroesophageal reflux disease currently  stable     glaucoma  2020 pt has fu at [facility] severe Right, followed at [facility], last visit 5/17 sees every 11 mo.    Hx of his Herpes simplex    history of renal transplant left   dec   2005  [facility]    1/20 pt has fu Feb 5th 12/19 Cr 1.69 GFR 45 6/19 Cr 1.41 GFR 56 3/19 Cr 1.67 GFR 46 11/18 seen by Dr. Estevan,  renal fx worsening, no comment on cause 11/18 Cr 1.65 GFR 47    history of transplantation of pancreas  12/19 [place] nl 8.4 amylase  and lipse nl PK txp 2005, pt reports had renal failure and was on HD, qualified for ki tp. 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    hyperlipidemia  Stable on fenofibrate  and statin     legal blindness    obesity  12/18 pt would like referral for bariatric surgery, would like to be seen by [facility] because of his hx of transplant    obstructive sleep apnea syndrome  4/18 compliant with CPAP, using nasal pillow, MILD OSA 12/17 mild, will f/u with sleep med for CPAP, may be cause of fatigue (rather than low T)    referred to dr rondal   and following  cpap    peptic ulcer  9/18 seen by gen surg/[facility], EGD and sono pending 11/17 pathology report from EGD and bx shows gastric ulcer, no neoplasm, H pylori negative 3/18 will need to decide on f/u EGD with Dr. Ilah hx of Hiatal hernia    polyp of colon  had cscope 5/17, bx showed melanosis coli only, no colitis    prostate specific antigen abnormal  3/19 PSA 1.5 PSA rise 2016 to 2017 on testosterone , followed by urology/[facility], last seen 3/18    testicular hypofunction  5/18 followed by urology, they will follow PSA and testosterone  levels prescribed gel 5/18 PSA 1.4 (slightly lower than last) followed by urology 5/18 PSA velocity increase  Pt needs  refill on  medication   lab  needed     Vitamin D  deficiency  On supplement          REVIEW OF SYSTEMS:   Review of Systems  General: No fever.  No chills.  No weight changes.  HEENT: No vision changes.  Cardiac: No chest pain. No palpitations.  No dizziness.  No light-headedness.  No near syncope.  Resp: No dyspnea at rest, no dyspnea on exertion; no cough or hemoptysis; no orthopnea or PND.  GI: No N/V. No melena.  No bright red blood per rectum.  Ext: No edema.  No claudication.  Neuro: No focal weakness.  No numbness.  All other ROS negative.      Objective: BP (!) 160/80 (Site: Left Arm, Patient Position: Sitting)   Pulse 72   Ht 1.829 m (6')   Wt (!) 142 kg (312 lb 12.8 oz)   SpO2 95%   BMI 42.42 kg/m                PHYSICAL EXAM  Physical Exam  Gen: NAD. Alert.   HEENT: PERRL; conjunctivae clear. No JVD or carotid bruit.  Cardiac: RRR with normal S1, S2.   Lungs: Clear to auscultation bilaterally. No rales. No wheezing. No rhonchi.  Abdomen: Soft, non-tender.non-distended  nl bowel sounds    Extremities: No edema. No cyanosis. No clubbing.  Neurologic:  Grossly intact  Foot exam  + pulses bilateral   no skin break down     Lab Results   Component Value Date    CHOLESTEROL 162 06/11/2023    HDLCHOL 38 (L) 06/11/2023    LDLCHOL 75 06/11/2023    TRIG 246 (H) 06/11/2023       COMPLETE BLOOD COUNT   Lab Results   Component Value Date    WBC 8.1 08/26/2023    HGB 10.0 (L) 08/26/2023    HCT 31.6 (L) 08/26/2023  PLTCNT 234 08/26/2023    BANDS 3 (L) 09/09/2021       DIFFERENTIAL  Lab Results   Component Value Date    PMNS 66 06/11/2023    LYMPHOCYTES 21 06/11/2023    MYELOCYTES 3 09/08/2021    MONOCYTES 9 06/11/2023    EOSINOPHIL 3 06/11/2023    BASOPHILS 1 06/11/2023    BASOPHILS 0.00 06/11/2023    PMNABS 3.30 06/11/2023    LYMPHSABS 1.10 06/11/2023    EOSABS 0.20 06/11/2023    MONOSABS 0.50 06/11/2023        COMPREHENSIVE METABOLIC PANEL  Lab Results   Component Value Date    SODIUM 139 10/02/2023    POTASSIUM 3.9  10/02/2023    CHLORIDE 98 10/02/2023    CO2 31 10/02/2023    ANIONGAP 10 10/02/2023    BUN 76 (H) 10/02/2023    CREATININE 3.87 (H) 10/02/2023    GLUCOSENF 76 10/02/2023    GLUCOSE Negative 08/26/2023    CALCIUM 9.5 10/02/2023    PHOSPHORUS 3.0 (L) 02/09/2023    ALBUMIN 4.2 10/02/2023    TOTALPROTEIN 6.9 10/02/2023    ALKPHOS 35 10/02/2023    AST 19 10/02/2023    ALT 20 10/02/2023    GFR 17 (L) 10/02/2023                THYROID  STIMULATING HORMONE  Lab Results   Component Value Date    TSH 2.750 06/11/2023        Lab Results   Component Value Date    HA1C 6.0 08/31/2023     Lab Results   Component Value Date    VITD 46 05/09/2022         No orders of the defined types were placed in this encounter.     Assessment & Plan  Colon cancer screening    Paroxysmal atrial fibrillation (CMS HCC)    Mixed hyperlipidemia    Diabetic peripheral neuropathy    CKD (chronic kidney disease) stage 4, GFR 15-29 ml/min (CMS [facility])    Renal transplant recipient    Candidiasis           Diabetic class discussed  Nov 13th      He really wants  to  loose  wt   We have not started Ozempic  or Mounjaro due to his renal / pancrease  transplant    If he talks to his  transplant team and they give approval for the above  than we can consider  them       Meds reviewed as well as labs.  Chart reviewed and updated.   Continue current treatment.  Keep follow-up appointment.   Vaccine hx reviewed.

## 2024-03-08 ENCOUNTER — Telehealth (INDEPENDENT_AMBULATORY_CARE_PROVIDER_SITE_OTHER): Payer: Self-pay | Admitting: Internal Medicine

## 2024-03-08 ENCOUNTER — Ambulatory Visit (INDEPENDENT_AMBULATORY_CARE_PROVIDER_SITE_OTHER): Payer: Self-pay | Admitting: Internal Medicine

## 2024-03-08 DIAGNOSIS — Z9483 Pancreas transplant status: Secondary | ICD-10-CM

## 2024-03-08 DIAGNOSIS — N1831 Chronic kidney disease, stage 3a: Secondary | ICD-10-CM

## 2024-03-08 DIAGNOSIS — M109 Gout, unspecified: Secondary | ICD-10-CM

## 2024-03-08 DIAGNOSIS — R7989 Other specified abnormal findings of blood chemistry: Secondary | ICD-10-CM

## 2024-03-08 LAB — PSA DIAGNOSTIC WITH FREE PSA REFLEX: PSA: 2.13 ng/mL (ref <=4.00)

## 2024-03-08 MED ORDER — PRAVASTATIN 20 MG TABLET
20.0000 mg | ORAL_TABLET | Freq: Two times a day (BID) | ORAL | 3 refills | Status: AC
Start: 2024-03-08 — End: ?

## 2024-03-08 MED ORDER — ALLOPURINOL 100 MG TABLET
100.0000 mg | ORAL_TABLET | Freq: Two times a day (BID) | ORAL | 3 refills | Status: AC
Start: 2024-03-08 — End: ?

## 2024-03-10 ENCOUNTER — Telehealth (INDEPENDENT_AMBULATORY_CARE_PROVIDER_SITE_OTHER): Payer: Self-pay | Admitting: Internal Medicine

## 2024-03-11 NOTE — Telephone Encounter (Signed)
 Left voicemail of what is to take on voicemail

## 2024-03-14 ENCOUNTER — Encounter (INDEPENDENT_AMBULATORY_CARE_PROVIDER_SITE_OTHER): Payer: Self-pay | Admitting: Internal Medicine

## 2024-03-17 ENCOUNTER — Ambulatory Visit (HOSPITAL_COMMUNITY): Payer: Self-pay

## 2024-03-20 ENCOUNTER — Other Ambulatory Visit (INDEPENDENT_AMBULATORY_CARE_PROVIDER_SITE_OTHER): Payer: Self-pay

## 2024-04-06 ENCOUNTER — Encounter (INDEPENDENT_AMBULATORY_CARE_PROVIDER_SITE_OTHER): Payer: Self-pay | Admitting: Surgery

## 2024-04-13 ENCOUNTER — Ambulatory Visit (HOSPITAL_COMMUNITY): Payer: Self-pay

## 2024-04-17 NOTE — H&P (Unsigned)
 GENERAL SURGERY, [facility]  [address]  [place] [address]       Name: Martin Porter MRN:  [id number]   Date: 04/18/2024 Age: 58 y.o. 04/24/1966      PCP: Suzen DELENA Asa, PA-C     Subjective  Martin Porter is a 58 y.o. year old male who presents for screening colonoscopy.  No current GI complaints.  No constipation.  No abdominal pain.  No rectal bleeding.  No unexplained weight loss.      Family history of colon cancer:  ***    Last colonoscopy:  ***    Patient's referral to this office included a recent assessment by the referring provider.  This was reviewed by me for this unique office visit for the indication and intent of the referral as well as any pertinent medical or surgical history relevant to the patient's independent evaluation by me today.     Allergies[1]   Current Outpatient Medications   Medication Sig    acetaminophen  (TYLENOL ) 500 mg Oral Tablet Take 1 Tablet (500 mg total) by mouth Every 4 hours as needed for Pain    allopurinoL  (ZYLOPRIM ) 100 mg Oral Tablet Take 1 Tablet (100 mg total) by mouth Daily    allopurinoL  (ZYLOPRIM ) 100 mg Oral Tablet Take 1 Tablet (100 mg total) by mouth Twice daily    amLODIPine  (NORVASC ) 5 mg Oral Tablet Take 1 Tablet (5 mg total) by mouth Twice daily    aspirin  (ECOTRIN) 81 mg Oral Tablet, Delayed Release (E.C.) Take 1 Tablet (81 mg total) by mouth (Patient not taking: Reported on 03/07/2024)    aspirin  (ECOTRIN) 81 mg Oral Tablet, Delayed Release (E.C.) Take 1 Tablet (81 mg total) by mouth Daily    atenoloL  (TENORMIN ) 50 mg Oral Tablet Take 1 Tablet (50 mg total) by mouth Twice daily    atropine  (ISOPTO ATROPINE ) 1 % Ophthalmic Drops 1 Drop Four times a day    carvediloL  (COREG ) 25 mg Oral Tablet Take 1 Tablet (25 mg total) by mouth Twice daily Take with food.    cholecalciferol , vitamin D3, 1,250 mcg (50,000 unit) Oral Capsule Take 1 Capsule (50,000 Units total) by mouth Every 7 days    cloNIDine  HCL (CATAPRES )  0.1 mg Oral Tablet Take 1 Tablet (0.1 mg total) by mouth Twice daily    CREON  24,000-76,000 -120,000 unit Oral Capsule, Delayed Release(E.C.) Take 2 Capsules by mouth Three times daily with meals    CREON  36,000-114,000- 180,000 unit Oral Capsule, Delayed Release(E.C.) TAKE TWO CAPSULES BY MOUTH THREE TIMES A DAY WITH MEALS    docusate sodium (COLACE) 100 mg Oral Capsule Take 1 Capsule (100 mg total) by mouth    dorzolamide -timoloL  (COSOPT ) 22.3-6.8 mg/mL Ophthalmic Drops Administer 1 Drop into the left eye Twice daily    empagliflozin  (JARDIANCE ) 10 mg Oral Tablet Take 1 Tablet (10 mg total) by mouth Daily    ergocalciferol , vitamin D2, (DRISDOL ) 1,250 mcg (50,000 unit) Oral Capsule Take 1 Capsule (50,000 Units total) by mouth Every 7 days    famotidine  (PEPCID ) 40 mg Oral Tablet Take 1 Tablet (40 mg total) by mouth Twice daily    fenofibrate  (LOFIBRA) 160 mg Oral Tablet Take 1 Tablet (160 mg total) by mouth Daily    furosemide  (LASIX ) 40 mg Oral Tablet Take 1 Tablet (40 mg total) by mouth Twice daily    gabapentin  (NEURONTIN ) 800 mg Oral Tablet Take 1 Tablet (800 mg total) by mouth Three times a  day    hydroCHLOROthiazide  (HYDRODIURIL ) 25 mg Oral Tablet Take 1 Tablet (25 mg total) by mouth Daily    insulin  aspart U-100 (NOVOLOG  FLEXPEN U-100 INSULIN ) 100 unit/mL (3 mL) Subcutaneous Insulin  Pen Inject 120 Units under the skin Daily    insulin  glargine (LANTUS  SOLOSTAR U-100 INSULIN ) 100 unit/mL Subcutaneous Insulin  Pen Inject 120 Units under the skin Daily INJECT UNDER THE SKIN 110 units daily    iron  ps complex-B12-folic acid  (POLY-IRON  150 FORTE) 150-25-1 mg-mcg-mg Oral Capsule Take 2 Capsules by mouth Daily    magnesium  oxide (MAG-OX) 400 mg Oral Tablet Take 1 Tablet (400 mg total) by mouth Twice daily    multivitamin with iron  Oral Tablet Take 1 Tablet by mouth Daily    mycophenolate  sodium (MYFORTIC ) 180 mg Oral Tablet, Delayed Release (E.C.) Take 3 Tablets (540 mg total) by mouth Twice daily    nystatin   (MYCOSTATIN ) 100,000 unit/gram Cream Apply topically Twice daily    nystatin  (NYSTOP ) 100,000 unit/gram Powder Apply topically Twice daily    pantoprazole  (PROTONIX ) 40 mg Oral Tablet, Delayed Release (E.C.) Take 1 Tablet (40 mg total) by mouth Twice daily    Pen Needle, Disposable, (BD UF MINI PEN NEEDLE) 31 gauge x 3/16 Needle USE 8TIMES A DAY    polysaccharide iron  complex (FERREX 150) 150 mg iron  Oral Capsule Take 1 Capsule (150 mg total) by mouth Daily    potassium chloride  (K-DUR) 10 mEq Oral Tab Sust.Rel. Particle/Crystal Take 1 Tablet (10 mEq total) by mouth Twice daily    pravastatin  (PRAVACHOL ) 20 mg Oral Tablet Take 1 Tablet (20 mg total) by mouth Every night    pravastatin  (PRAVACHOL ) 20 mg Oral Tablet Take 1 Tablet (20 mg total) by mouth Twice daily    prednisoLONE  acetate (PRED FORTE ) 1 % Ophthalmic Drops, Suspension Instill 1 Drop into both eyes Twice daily    tacrolimus  (PROGRAF ) 1 mg Oral Capsule Take 3 Capsules (3 mg total) by mouth Twice daily    testosterone  (ANDROGEL ) 20.25 mg/1.25 gram (1.62 %) Transdermal Gel in Metered-dose Pump Place 20.25 mg on the skin Once a day    traZODone  (DESYREL ) 50 mg Oral Tablet TAKE ONE TABLET BY MOUTH EVERY NIGHT    vitamin E 400 unit Oral Capsule Take 1 Capsule (400 Units total) by mouth Once a day          Objective:     There were no vitals filed for this visit.     General: appropriate for age. in no acute distress.    Vital signs are present above and have been reviewed by me     HEENT: Atraumatic, Normocephalic.    Lungs: Nonlabored breathing with symmetric expansion    Heart:Regular wth respect to rate and rythmn.    Abdomen:Soft. Nontender. Nondistended     Psychiatric: Alert and oriented to person, place, and time. affect appropriate    Assessment/Plan  No diagnosis found.     Discussed indications, risks, and benefits of colonoscopy with the patient.  Discussed the possibility of polypectomy, biopsies, and repeat possible examinations.  Risks discussed  include bleeding, sedation risks, possibility of missed diagnosis of polyp malignancy, and remote possibility of perforation and/or death.  All questions answered and informed consent clearly obtained.    Office Visit was used for detailed explanation procedure and its indications, review of the patient's medications relative to the time before and after the procedure, and the effects of the associated medical conditions that affect the procedure preparation and procedure itself.  Alm DELENA Nam MD MBA CPE FACS          [1]   Allergies  Allergen Reactions    Strawberry

## 2024-04-18 ENCOUNTER — Ambulatory Visit (HOSPITAL_COMMUNITY): Payer: Self-pay

## 2024-04-18 ENCOUNTER — Encounter (INDEPENDENT_AMBULATORY_CARE_PROVIDER_SITE_OTHER): Admitting: Surgery

## 2024-04-18 ENCOUNTER — Ambulatory Visit (HOSPITAL_COMMUNITY)

## 2024-04-22 ENCOUNTER — Telehealth (INDEPENDENT_AMBULATORY_CARE_PROVIDER_SITE_OTHER): Payer: Self-pay | Admitting: Internal Medicine

## 2024-04-22 ENCOUNTER — Encounter (INDEPENDENT_AMBULATORY_CARE_PROVIDER_SITE_OTHER): Payer: Self-pay | Admitting: Internal Medicine

## 2024-04-22 MED ORDER — AMOXICILLIN 500 MG CAPSULE: 500.0000 mg | ORAL_CAPSULE | Freq: Three times a day (TID) | ORAL | 0 refills | Status: DC

## 2024-04-25 ENCOUNTER — Other Ambulatory Visit (INDEPENDENT_AMBULATORY_CARE_PROVIDER_SITE_OTHER): Payer: Self-pay | Admitting: Internal Medicine

## 2024-04-26 ENCOUNTER — Other Ambulatory Visit (INDEPENDENT_AMBULATORY_CARE_PROVIDER_SITE_OTHER): Payer: Self-pay | Admitting: Internal Medicine

## 2024-05-09 ENCOUNTER — Ambulatory Visit (INDEPENDENT_AMBULATORY_CARE_PROVIDER_SITE_OTHER): Payer: Self-pay | Admitting: Surgery

## 2024-05-09 ENCOUNTER — Other Ambulatory Visit: Payer: Self-pay

## 2024-05-09 ENCOUNTER — Encounter (INDEPENDENT_AMBULATORY_CARE_PROVIDER_SITE_OTHER): Payer: Self-pay | Admitting: Surgery

## 2024-05-09 ENCOUNTER — Ambulatory Visit (HOSPITAL_COMMUNITY)
Admission: RE | Admit: 2024-05-09 | Discharge: 2024-05-09 | Disposition: A | Discharge status: Home / Self Care | Source: Ambulatory Visit | Attending: Internal Medicine | Admitting: Internal Medicine

## 2024-05-09 ENCOUNTER — Ambulatory Visit
Admission: RE | Admit: 2024-05-09 | Discharge: 2024-05-09 | Disposition: A | Discharge status: Home / Self Care | Source: Ambulatory Visit | Attending: Internal Medicine | Admitting: Internal Medicine

## 2024-05-09 DIAGNOSIS — R7989 Other specified abnormal findings of blood chemistry: Secondary | ICD-10-CM | POA: Insufficient documentation

## 2024-05-09 DIAGNOSIS — Z1211 Encounter for screening for malignant neoplasm of colon: Secondary | ICD-10-CM

## 2024-05-09 DIAGNOSIS — Z01818 Encounter for other preprocedural examination: Secondary | ICD-10-CM

## 2024-05-09 MED ORDER — PEG 3350-ELECTROLYTES 236 GRAM-22.74 GRAM-6.74 GRAM-5.86 GRAM SOLUTION: 4.0000 L | Freq: Once | ORAL | 0 refills | Status: DC

## 2024-05-09 NOTE — H&P (Signed)
 GENERAL SURGERY, [facility] GENERAL SURGERY  [address]  [place] [address]       Name: Martin Porter MRN:  [id number]   Date: 05/09/2024 Age: 59 y.o. 21-Mar-1966      PCP: Suzen DELENA Asa, PA-C     Subjective  Martin Porter is a 59 y.o. year old male who presents for screening colonoscopy.  No current GI complaints.  No constipation.  No abdominal pain.  No rectal bleeding.  Patient states he has lost 60-70 lb within the past few years was effort at weight loss.    Denies any gastroesophageal reflux disease that is active at the current time.  He  does take daily proton pump inhibitors secondary to that.    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  This was reviewed by me for this unique office visit for the indication and intent of the referral as well as any pertinent medical or surgical history relevant to the patient's independent evaluation by me today.     Allergies[1]   Current Outpatient Medications   Medication Sig    acetaminophen  (TYLENOL ) 500 mg Oral Tablet Take 1 Tablet (500 mg total) by mouth Every 4 hours as needed for Pain    allopurinoL  (ZYLOPRIM ) 100 mg Oral Tablet Take 1 Tablet (100 mg total) by mouth Twice daily    amLODIPine  (NORVASC ) 5 mg Oral Tablet Take 1 Tablet (5 mg total) by mouth Twice daily    aspirin  (ECOTRIN) 81 mg Oral Tablet, Delayed Release (E.C.) Take 1 Tablet (81 mg total) by mouth    aspirin  (ECOTRIN) 81 mg Oral Tablet, Delayed Release (E.C.) Take 1 Tablet (81 mg total) by mouth Daily    atenoloL  (TENORMIN ) 50 mg Oral Tablet Take 1 Tablet (50 mg total) by mouth Twice daily    atropine  (ISOPTO ATROPINE ) 1 % Ophthalmic Drops 1 Drop Four times a day    carvediloL  (COREG ) 25 mg Oral Tablet Take 1 Tablet (25 mg total) by  mouth Twice daily Take with food.    cholecalciferol , vitamin D3, 1,250 mcg (50,000 unit) Oral Capsule Take 1 Capsule (50,000 Units total) by mouth Every 7 days    cloNIDine  HCL (CATAPRES ) 0.1 mg Oral Tablet Take 1 Tablet (0.1 mg total) by mouth Twice daily    CREON  36,000-114,000- 180,000 unit Oral Capsule, Delayed Release(E.C.) TAKE TWO CAPSULES BY MOUTH THREE TIMES A DAY WITH MEALS    docusate sodium (COLACE) 100 mg Oral Capsule Take 1 Capsule (100 mg total) by mouth    dorzolamide -timoloL  (COSOPT ) 22.3-6.8 mg/mL Ophthalmic Drops Administer 1 Drop into the left eye Twice daily    empagliflozin  (JARDIANCE ) 10 mg Oral Tablet Take 1 Tablet (10 mg total) by mouth Daily    ergocalciferol , vitamin D2, (DRISDOL ) 1,250 mcg (50,000 unit) Oral Capsule Take 1 Capsule (50,000 Units total) by mouth Every 7 days    famotidine  (PEPCID ) 40 mg Oral Tablet Take 1 Tablet (40 mg total) by mouth Twice daily    fenofibrate  (LOFIBRA) 160 mg Oral Tablet Take 1 Tablet (160 mg total) by mouth Daily    furosemide  (LASIX ) 40 mg Oral Tablet Take 1 Tablet (40 mg total) by mouth Twice daily    gabapentin  (NEURONTIN ) 800 mg Oral Tablet Take 1 Tablet (800  mg total) by mouth Three times a day    hydroCHLOROthiazide  (HYDRODIURIL ) 25 mg Oral Tablet Take 1 Tablet (25 mg total) by mouth Daily    insulin  aspart U-100 (NOVOLOG  FLEXPEN U-100 INSULIN ) 100 unit/mL (3 mL) Subcutaneous Insulin  Pen Inject 120 Units under the skin Daily    insulin  glargine (LANTUS  SOLOSTAR U-100 INSULIN ) 100 unit/mL Subcutaneous Insulin  Pen Inject 120 Units under the skin Daily INJECT UNDER THE SKIN 110 units daily    iron  ps complex-B12-folic acid  (POLY-IRON  150 FORTE) 150-25-1 mg-mcg-mg Oral Capsule Take 2 Capsules by mouth Daily    magnesium  oxide (MAG-OX) 400 mg Oral Tablet Take 1 Tablet (400 mg total) by mouth Twice daily    multivitamin with iron  Oral Tablet Take 1 Tablet by mouth Daily    mycophenolate  sodium (MYFORTIC ) 180 mg Oral Tablet, Delayed Release (E.C.) Take 3  Tablets (540 mg total) by mouth Twice daily    nystatin  (NYSTOP ) 100,000 unit/gram Powder Apply topically Twice daily    pantoprazole  (PROTONIX ) 40 mg Oral Tablet, Delayed Release (E.C.) Take 1 Tablet (40 mg total) by mouth Twice daily    Pen Needle, Disposable, (BD UF MINI PEN NEEDLE) 31 gauge x 3/16 Needle USE 8TIMES A DAY    polysaccharide iron  complex (FERREX 150) 150 mg iron  Oral Capsule Take 1 Capsule (150 mg total) by mouth Daily    potassium chloride  (K-DUR) 10 mEq Oral Tab Sust.Rel. Particle/Crystal Take 1 Tablet (10 mEq total) by mouth Twice daily    pravastatin  (PRAVACHOL ) 20 mg Oral Tablet Take 1 Tablet (20 mg total) by mouth Twice daily    prednisoLONE  acetate (PRED FORTE ) 1 % Ophthalmic Drops, Suspension Instill 1 Drop into both eyes Twice daily    tacrolimus  (PROGRAF ) 1 mg Oral Capsule Take 3 Capsules (3 mg total) by mouth Twice daily    testosterone  (ANDROGEL ) 20.25 mg/1.25 gram (1.62 %) Transdermal Gel in Metered-dose Pump Place 20.25 mg on the skin Once a day    traZODone  (DESYREL ) 50 mg Oral Tablet TAKE ONE TABLET BY MOUTH EVERY EVENING AT BEDTIME    vitamin E 400 unit Oral Capsule Take 1 Capsule (400 Units total) by mouth Once a day          Objective:       Vitals:    05/09/24 1058   BP: 138/62   Pulse: 85   Temp: 36.8 C (98.3 F)   SpO2: 95%   Weight: (!) 142 kg (312 lb)   Height: 1.829 m (6')   BMI: 42.31        General: appropriate for age. in no acute distress.  Obese    Vital signs are present above and have been reviewed by me     HEENT: Atraumatic, Normocephalic.    Lungs: Nonlabored breathing with symmetric expansion    Heart:Regular wth respect to rate and rythmn.    Abdomen:Soft. Nontender. Nondistended     Psychiatric: Alert and oriented to person, place, and time. affect appropriate    Assessment/Plan    ICD-10-CM    1. Colon cancer screening  Z12.11            Discussed indications, risks, and benefits of colonoscopy and esophagogastroduodenoscopy with the patient.  Discussed the  possibility of polypectomy, biopsies, and repeat possible examinations.  Risks discussed include bleeding, sedation risks, possibility of missed diagnosis of polyp malignancy, and remote possibility of perforation and/or death.  All questions answered and informed consent clearly obtained.    Office Visit was used  for detailed explanation procedure and its indications, review of the patient's medications relative to the time before and after the procedure, and the effects of the associated medical conditions that affect the procedure preparation and procedure itself.    Alm DELENA Nam MD MBA CPE FACS          [1]   Allergies  Allergen Reactions    Strawberry

## 2024-05-09 NOTE — Addendum Note (Signed)
 Addended by: WADDELL NORRIS R on: 05/09/2024 01:25 PM     Modules accepted: Orders

## 2024-05-09 NOTE — H&P (View-Only) (Signed)
 GENERAL SURGERY, [facility] GENERAL SURGERY  [address]  [place] [address]       Name: Martin Porter MRN:  [id number]   Date: 05/09/2024 Age: 59 y.o. 21-Mar-1966      PCP: Suzen DELENA Asa, PA-C     Subjective  Martin Porter is a 59 y.o. year old male who presents for screening colonoscopy.  No current GI complaints.  No constipation.  No abdominal pain.  No rectal bleeding.  Patient states he has lost 60-70 lb within the past few years was effort at weight loss.    Denies any gastroesophageal reflux disease that is active at the current time.  He  does take daily proton pump inhibitors secondary to that.    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  This was reviewed by me for this unique office visit for the indication and intent of the referral as well as any pertinent medical or surgical history relevant to the patient's independent evaluation by me today.     Allergies[1]   Current Outpatient Medications   Medication Sig    acetaminophen  (TYLENOL ) 500 mg Oral Tablet Take 1 Tablet (500 mg total) by mouth Every 4 hours as needed for Pain    allopurinoL  (ZYLOPRIM ) 100 mg Oral Tablet Take 1 Tablet (100 mg total) by mouth Twice daily    amLODIPine  (NORVASC ) 5 mg Oral Tablet Take 1 Tablet (5 mg total) by mouth Twice daily    aspirin  (ECOTRIN) 81 mg Oral Tablet, Delayed Release (E.C.) Take 1 Tablet (81 mg total) by mouth    aspirin  (ECOTRIN) 81 mg Oral Tablet, Delayed Release (E.C.) Take 1 Tablet (81 mg total) by mouth Daily    atenoloL  (TENORMIN ) 50 mg Oral Tablet Take 1 Tablet (50 mg total) by mouth Twice daily    atropine  (ISOPTO ATROPINE ) 1 % Ophthalmic Drops 1 Drop Four times a day    carvediloL  (COREG ) 25 mg Oral Tablet Take 1 Tablet (25 mg total) by  mouth Twice daily Take with food.    cholecalciferol , vitamin D3, 1,250 mcg (50,000 unit) Oral Capsule Take 1 Capsule (50,000 Units total) by mouth Every 7 days    cloNIDine  HCL (CATAPRES ) 0.1 mg Oral Tablet Take 1 Tablet (0.1 mg total) by mouth Twice daily    CREON  36,000-114,000- 180,000 unit Oral Capsule, Delayed Release(E.C.) TAKE TWO CAPSULES BY MOUTH THREE TIMES A DAY WITH MEALS    docusate sodium (COLACE) 100 mg Oral Capsule Take 1 Capsule (100 mg total) by mouth    dorzolamide -timoloL  (COSOPT ) 22.3-6.8 mg/mL Ophthalmic Drops Administer 1 Drop into the left eye Twice daily    empagliflozin  (JARDIANCE ) 10 mg Oral Tablet Take 1 Tablet (10 mg total) by mouth Daily    ergocalciferol , vitamin D2, (DRISDOL ) 1,250 mcg (50,000 unit) Oral Capsule Take 1 Capsule (50,000 Units total) by mouth Every 7 days    famotidine  (PEPCID ) 40 mg Oral Tablet Take 1 Tablet (40 mg total) by mouth Twice daily    fenofibrate  (LOFIBRA) 160 mg Oral Tablet Take 1 Tablet (160 mg total) by mouth Daily    furosemide  (LASIX ) 40 mg Oral Tablet Take 1 Tablet (40 mg total) by mouth Twice daily    gabapentin  (NEURONTIN ) 800 mg Oral Tablet Take 1 Tablet (800  mg total) by mouth Three times a day    hydroCHLOROthiazide  (HYDRODIURIL ) 25 mg Oral Tablet Take 1 Tablet (25 mg total) by mouth Daily    insulin  aspart U-100 (NOVOLOG  FLEXPEN U-100 INSULIN ) 100 unit/mL (3 mL) Subcutaneous Insulin  Pen Inject 120 Units under the skin Daily    insulin  glargine (LANTUS  SOLOSTAR U-100 INSULIN ) 100 unit/mL Subcutaneous Insulin  Pen Inject 120 Units under the skin Daily INJECT UNDER THE SKIN 110 units daily    iron  ps complex-B12-folic acid  (POLY-IRON  150 FORTE) 150-25-1 mg-mcg-mg Oral Capsule Take 2 Capsules by mouth Daily    magnesium  oxide (MAG-OX) 400 mg Oral Tablet Take 1 Tablet (400 mg total) by mouth Twice daily    multivitamin with iron  Oral Tablet Take 1 Tablet by mouth Daily    mycophenolate  sodium (MYFORTIC ) 180 mg Oral Tablet, Delayed Release (E.C.) Take 3  Tablets (540 mg total) by mouth Twice daily    nystatin  (NYSTOP ) 100,000 unit/gram Powder Apply topically Twice daily    pantoprazole  (PROTONIX ) 40 mg Oral Tablet, Delayed Release (E.C.) Take 1 Tablet (40 mg total) by mouth Twice daily    Pen Needle, Disposable, (BD UF MINI PEN NEEDLE) 31 gauge x 3/16 Needle USE 8TIMES A DAY    polysaccharide iron  complex (FERREX 150) 150 mg iron  Oral Capsule Take 1 Capsule (150 mg total) by mouth Daily    potassium chloride  (K-DUR) 10 mEq Oral Tab Sust.Rel. Particle/Crystal Take 1 Tablet (10 mEq total) by mouth Twice daily    pravastatin  (PRAVACHOL ) 20 mg Oral Tablet Take 1 Tablet (20 mg total) by mouth Twice daily    prednisoLONE  acetate (PRED FORTE ) 1 % Ophthalmic Drops, Suspension Instill 1 Drop into both eyes Twice daily    tacrolimus  (PROGRAF ) 1 mg Oral Capsule Take 3 Capsules (3 mg total) by mouth Twice daily    testosterone  (ANDROGEL ) 20.25 mg/1.25 gram (1.62 %) Transdermal Gel in Metered-dose Pump Place 20.25 mg on the skin Once a day    traZODone  (DESYREL ) 50 mg Oral Tablet TAKE ONE TABLET BY MOUTH EVERY EVENING AT BEDTIME    vitamin E 400 unit Oral Capsule Take 1 Capsule (400 Units total) by mouth Once a day          Objective:       Vitals:    05/09/24 1058   BP: 138/62   Pulse: 85   Temp: 36.8 C (98.3 F)   SpO2: 95%   Weight: (!) 142 kg (312 lb)   Height: 1.829 m (6')   BMI: 42.31        General: appropriate for age. in no acute distress.  Obese    Vital signs are present above and have been reviewed by me     HEENT: Atraumatic, Normocephalic.    Lungs: Nonlabored breathing with symmetric expansion    Heart:Regular wth respect to rate and rythmn.    Abdomen:Soft. Nontender. Nondistended     Psychiatric: Alert and oriented to person, place, and time. affect appropriate    Assessment/Plan    ICD-10-CM    1. Colon cancer screening  Z12.11            Discussed indications, risks, and benefits of colonoscopy and esophagogastroduodenoscopy with the patient.  Discussed the  possibility of polypectomy, biopsies, and repeat possible examinations.  Risks discussed include bleeding, sedation risks, possibility of missed diagnosis of polyp malignancy, and remote possibility of perforation and/or death.  All questions answered and informed consent clearly obtained.    Office Visit was used  for detailed explanation procedure and its indications, review of the patient's medications relative to the time before and after the procedure, and the effects of the associated medical conditions that affect the procedure preparation and procedure itself.    Alm DELENA Nam MD MBA CPE FACS          [1]   Allergies  Allergen Reactions    Strawberry

## 2024-05-10 DIAGNOSIS — R7989 Other specified abnormal findings of blood chemistry: Secondary | ICD-10-CM

## 2024-05-12 ENCOUNTER — Ambulatory Visit (HOSPITAL_COMMUNITY): Admitting: Surgery

## 2024-05-12 ENCOUNTER — Ambulatory Visit
Admission: RE | Admit: 2024-05-12 | Discharge: 2024-05-12 | Disposition: A | Discharge status: Home / Self Care | Source: Ambulatory Visit | Attending: Surgery | Admitting: Surgery

## 2024-05-12 ENCOUNTER — Ambulatory Visit (HOSPITAL_COMMUNITY): Admitting: Certified Registered"

## 2024-05-12 ENCOUNTER — Encounter (HOSPITAL_COMMUNITY)
Admission: RE | Disposition: A | Discharge status: Home / Self Care | Payer: Self-pay | Source: Ambulatory Visit | Attending: Surgery

## 2024-05-12 ENCOUNTER — Other Ambulatory Visit: Payer: Self-pay

## 2024-05-12 ENCOUNTER — Encounter (HOSPITAL_COMMUNITY): Payer: Self-pay | Admitting: Surgery

## 2024-05-12 DIAGNOSIS — K449 Diaphragmatic hernia without obstruction or gangrene: Secondary | ICD-10-CM | POA: Insufficient documentation

## 2024-05-12 DIAGNOSIS — K319 Disease of stomach and duodenum, unspecified: Secondary | ICD-10-CM | POA: Insufficient documentation

## 2024-05-12 DIAGNOSIS — K2950 Unspecified chronic gastritis without bleeding: Secondary | ICD-10-CM | POA: Insufficient documentation

## 2024-05-12 DIAGNOSIS — K5730 Diverticulosis of large intestine without perforation or abscess without bleeding: Secondary | ICD-10-CM | POA: Insufficient documentation

## 2024-05-12 DIAGNOSIS — K219 Gastro-esophageal reflux disease without esophagitis: Secondary | ICD-10-CM | POA: Insufficient documentation

## 2024-05-12 DIAGNOSIS — K6389 Other specified diseases of intestine: Secondary | ICD-10-CM | POA: Insufficient documentation

## 2024-05-12 DIAGNOSIS — Z1211 Encounter for screening for malignant neoplasm of colon: Secondary | ICD-10-CM | POA: Insufficient documentation

## 2024-05-12 MED ORDER — PROPOFOL 10 MG/ML IV BOLUS
INJECTION | Freq: Once | INTRAVENOUS | Status: DC | PRN
  Administered 2024-05-12 (×3): 100 mg via INTRAVENOUS

## 2024-05-12 MED ORDER — LIDOCAINE (PF) 100 MG/5 ML (2 %) INTRAVENOUS SYRINGE
INJECTION | Freq: Once | INTRAVENOUS | Status: DC | PRN
  Administered 2024-05-12: 100 mg via INTRAVENOUS

## 2024-05-12 MED ORDER — LACTATED RINGERS INTRAVENOUS SOLUTION
INTRAVENOUS | Status: DC | PRN
  Administered 2024-05-12: 0 via INTRAVENOUS

## 2024-05-12 MED ORDER — PROPOFOL 10 MG/ML INTRAVENOUS EMULSION
INTRAVENOUS | Status: AC
  Filled 2024-05-12: qty 20

## 2024-05-12 MED ORDER — LIDOCAINE (PF) 20 MG/ML (2 %) INJECTION SOLUTION
INTRAMUSCULAR | Status: AC
  Filled 2024-05-12: qty 5

## 2024-05-12 NOTE — Anesthesia Postprocedure Evaluation (Signed)
 Anesthesia Post Op Evaluation    Patient: Martin Porter  Procedure(s):  EGD with biopsy  COLONOSCOPY    Last Vitals:Temperature: 36.8 C (98.3 F) (05/12/24 1227)  Heart Rate: 72 (05/12/24 1227)  BP (Non-Invasive): (!) 143/73 (05/12/24 1227)  Respiratory Rate: 18 (05/12/24 1227)  SpO2: 93 % (05/12/24 1227)    No notable events documented.    Patient is sufficiently recovered from the effects of anesthesia to participate in the evaluation and has returned to their pre-procedure level.  Patient location during evaluation: PACU       Patient participation: complete - patient participated  Level of consciousness: awake and alert and responsive to verbal stimuli    Pain management: adequate  Airway patency: patent    Anesthetic complications: no  Cardiovascular status: acceptable  Respiratory status: acceptable  Hydration status: acceptable  Patient post-procedure temperature: Pt Normothermic   PONV Status: Absent

## 2024-05-12 NOTE — Discharge Instructions (Addendum)
 SURGICAL DISCHARGE INSTRUCTIONS     Dr. Steen Lenis, MD  performed your EGD with biopsy, COLONOSCOPY today at the [facility]    [place]  [facility]:  Monday through Friday from 8 a.m. - 4 p.m.: [phone]    For T&D: [phone]  Between 4 p.m. - 8 a.m., weekends and holidays:  Call ER [phone]    PLEASE SEE WRITTEN HANDOUTS AS DISCUSSED BY YOUR NURSE:  Pansey Pinheiro RN    SIGNS AND SYMPTOMS OF A WOUND / INCISION INFECTION   Be sure to watch for the following:  Increase in redness or red streaks near or around the wound or incision.  Increase in pain that is intense or severe and cannot be relieved by the pain medication that your doctor has given you.  Increase in swelling that cannot be relieved by elevation of a body part, or by applying ice, if permitted.  Increase in drainage, or if yellow / green in color and smells bad. This could be on a dressing or a cast.  Increase in fever for longer than 24 hours, or an increase that is higher than 101 degrees Fahrenheit (normal body temperature is 98 degrees Fahrenheit). The incision may feel warm to the touch.    **CALL YOUR DOCTOR IF ONE OR MORE OF THESE SIGNS / SYMPTOMS SHOULD OCCUR.    ANESTHESIA INFORMATION   ANESTHESIA -- ADULT PATIENTS:  You have received intravenous sedation / general anesthesia, and you may feel drowsy and light-headed for several hours. You may even experience some forgetfulness of the procedure. DO NOT DRIVE A MOTOR VEHICLE or perform any activity requiring complete alertness or coordination until you feel fully awake in about 24-48 hours. Do not drink alcoholic beverages for at least 24 hours. Do not stay alone, you must have a responsible adult available to be with you. You may also experience a dry mouth or nausea for 24 hours. This is a normal side effect and will disappear as the effects of the medication wear off.    REMEMBER   If you experience any difficulty breathing, chest pain, bleeding  that you feel is excessive, persistent nausea or vomiting or for any other concerns:  Call your physician Dr.  Steen Lenis, MD   at [phone] . You may also ask to have the general doctor on call paged. They are available to you 24 hours a day.      SPECIAL INSTRUCTIONS / COMMENTS   POST-OP DIAGNOSIS--3 CM HIATAL HERNIA, GASTRITIS, SUB OPTIMAL PREP, MELANOSIS COLI  MILD SIGMOID DIVERTICULOSIS  REST TODAY--DO NOT DRIVE OR OPERATE ELECTRICAL EQUIPMENT OR SIGN LEGAL DOCUMENTS FOR 24 HOURS  RETURN TO SEE DR MULLINS AS NEEDED    FOLLOW-UP APPOINTMENTS     Please call your surgeon's office at the number listed to schedule a date / time of return for follow-up.     Dr Lenis Steen [phone]

## 2024-05-12 NOTE — Anesthesia Preprocedure Evaluation (Signed)
 ANESTHESIA PRE-OP EVALUATION  Planned Procedure: EGD  COLONOSCOPY  Review of Systems     anesthesia history negative     patient summary reviewed  nursing notes reviewed        Pulmonary   sleep apnea and CPAP,   Cardiovascular    Hypertension, well controlled, ECG reviewed, atrial fibrillation and hyperlipidemia , Exercise Tolerance: > or = 4 METS        GI/Hepatic/Renal    Kidney transplant 2005; [facility], hiatal hernia, GERD and PUD        Endo/Other    drug induced coagulopathy and morbid obesity,   type 2 diabetes    Neuro/Psych/MS   negative neuro/psych ROS,      Cancer    negative hematology/oncology ROS,                   Physical Assessment      Airway       Mallampati: III    TM distance: <3 FB    Mouth Opening: good.            Dental       Dentition intact             Pulmonary    Breath sounds clear to auscultation       Cardiovascular    Rhythm: regular  Rate: Normal       Other findings            Plan  ASA 3     Planned anesthesia type: general     total intravenous anesthesia            SLEEP APNEA  Patient is at risk of obstructive sleep apnea and Education provided regarding risk of obstructive sleep apnea        Intravenous induction       Anesthetic plan and risks discussed with patient  signed consent obtained          Patient's NPO status is appropriate for Anesthesia.

## 2024-05-12 NOTE — Interval H&P Note (Signed)
 [facility]      H&P UPDATE FORM                                                                                  Kalyb, Pemble, 59 y.o. male  Date of Admission:  05/12/2024  Date of Birth:  1965/06/09    05/12/2024    STOP: IF H&P IS GREATER THAN 30 DAYS FROM SURGICAL DAY COMPLETE NEW H&P IS REQUIRED.     H & P updated the day of the procedure.  1.  H&P completed within 30 days of surgical procedure and has been reviewed within 24 hours of admission but prior to surgery or a procedure requiring anesthesia services, the patient has been examined, and no change has occured in the patients condition since the H&P was completed.       Change in medications: No              Comments:     2.  Patient continues to be appropriate candidate for planned surgical procedure. YES    Alm Nam, MD

## 2024-05-12 NOTE — OR Surgeon (Signed)
 [facility]      Patient Name: Heather, Streeper Number: [id number]  Date of Service: 05/12/2024   Date of Birth: 08-Sep-1965      Pre-Operative Diagnosis: Gastroesophageal reflux disease, unspecified whether esophagitis present [K21.9]  Encounter for screening colonoscopy [Z12.11]     Post-Operative Diagnosis: gastritis  3cm hiatal hernia  suboptimal prep  melanosis coli  mild sigmoid diverticulosis    Procedure(s)/Description:  EGD with biopsy: 43239 (CPT)    COLONOSCOPY: 54621 (CPT)       Attending Surgeon: Alm Nam, MD     Anesthesia:  CRNA: Gino Fulling, CRNA    Anesthesia Type: .General     Specimen:  Antrum    Patient was taken to the endoscopy suite.  Given appropriate intravenous sedation.  Video gastroscope inserted into the posterior pharynx and directed distally into the esophagus.  Esophagus was transversed and the stomach entered without difficulty.  The stomach was insufflated with air.  Scope was advanced to the level of the pylorus.  Pylorus was cannulated.  The first and second portion of the duodenum visualized without evidence of any abnormality.  Scope was withdrawn back into the distal stomach.  Single biopsy was taken for H. pylori at this level.  There were mild changes of superficial gastritis noted within the antrum.  The scope was withdrawn back.  The body and the fundus showed no specific abnormality.  The scope was retroflexed.  The gastroesophageal junction visualized from below.  There was a 3 cm hiatal hernia and no other specific abnormality.  Scope was then withdrawn back to the main course of the esophageal lumen.  There is no significant irregularity at the level of the squamocolumnar junction.  No signs of esophagitis.  There is no luminal constriction, or any other abnormality noted upon withdrawing back through the esophageal lumen.  Scope was then withdrawn, and this concluded the procedure patient tolerated well.  Patient was turned.   Videocolonoscope was inserted into the rectum and advanced sequentially to the level of the cecum.  Cecum was confirmed by external palpation, presence of the ileocecal valve and transillumination of the light.  Picture was taken that documents this level.  Patient's preparation was suboptimal with liquid stool at various locations throughout the colonic lumen.  This was irrigated clear to the best of the ability, but limitations of the prep could preclude visualization of small mucosal abnormalities and/or polyps.   Colonoscope was then subsequently withdrawn back inspecting all mucosal surfaces.  The ascending colon, transverse colon, descending colon, and sigmoid colon were all visualized with mild changes of diverticulosis noted within the sigmoid colon.  In addition to that there was submucosal staining throughout the entire length of the colon consistent with melanosis coli.    The scope was withdrawn back to the level of the rectum and retroflexed.  The rectal anal junction visualized with internal hemorrhoids and no other specific abnormality.  Scope was then subsequently straightened and withdrawn.  This concluded the procedure which patient tolerated well.    Alm DELENA Nam MD MBA CPE FACS

## 2024-05-13 ENCOUNTER — Telehealth (INDEPENDENT_AMBULATORY_CARE_PROVIDER_SITE_OTHER): Payer: Self-pay | Admitting: Surgery

## 2024-05-13 DIAGNOSIS — K2950 Unspecified chronic gastritis without bleeding: Secondary | ICD-10-CM

## 2024-05-13 LAB — SURGICAL PATHOLOGY SPECIMEN

## 2024-05-20 ENCOUNTER — Ambulatory Visit (INDEPENDENT_AMBULATORY_CARE_PROVIDER_SITE_OTHER): Payer: Self-pay | Admitting: Internal Medicine

## 2024-05-25 ENCOUNTER — Ambulatory Visit (HOSPITAL_COMMUNITY)

## 2024-05-27 ENCOUNTER — Ambulatory Visit (HOSPITAL_BASED_OUTPATIENT_CLINIC_OR_DEPARTMENT_OTHER): Admitting: RADIATION ONCOLOGY

## 2024-06-01 ENCOUNTER — Other Ambulatory Visit (INDEPENDENT_AMBULATORY_CARE_PROVIDER_SITE_OTHER): Payer: Self-pay | Admitting: Internal Medicine

## 2024-06-01 MED ORDER — MYCOPHENOLATE SODIUM 180 MG TABLET,DELAYED RELEASE: 540.0000 mg | DELAYED_RELEASE_TABLET | Freq: Two times a day (BID) | ORAL | 3 refills | Status: DC

## 2024-06-01 MED ORDER — LANTUS SOLOSTAR U-100 INSULIN 100 UNIT/ML (3 ML) SUBCUTANEOUS PEN: 120.0000 [IU] | PEN_INJECTOR | Freq: Every evening | SUBCUTANEOUS | 0 refills | Status: DC

## 2024-06-03 ENCOUNTER — Other Ambulatory Visit (INDEPENDENT_AMBULATORY_CARE_PROVIDER_SITE_OTHER): Payer: Self-pay | Admitting: Internal Medicine

## 2024-06-03 ENCOUNTER — Ambulatory Visit (HOSPITAL_BASED_OUTPATIENT_CLINIC_OR_DEPARTMENT_OTHER): Admitting: RADIATION ONCOLOGY

## 2024-06-03 MED ORDER — LANTUS SOLOSTAR U-100 INSULIN 100 UNIT/ML (3 ML) SUBCUTANEOUS PEN: 120.0000 [IU] | PEN_INJECTOR | Freq: Every evening | SUBCUTANEOUS | 3 refills | Status: AC

## 2024-06-03 MED ORDER — INSULIN ASPART (U-100) 100 UNIT/ML (3 ML) SUBCUTANEOUS PEN: 120.0000 [IU] | PEN_INJECTOR | Freq: Every day | SUBCUTANEOUS | 3 refills | Status: AC

## 2024-06-03 MED ORDER — MYCOPHENOLATE SODIUM 180 MG TABLET,DELAYED RELEASE: 540.0000 mg | DELAYED_RELEASE_TABLET | Freq: Two times a day (BID) | ORAL | 3 refills | Status: AC

## 2024-06-21 ENCOUNTER — Ambulatory Visit: Attending: Internal Medicine | Admitting: RADIATION ONCOLOGY

## 2025-02-21 ENCOUNTER — Ambulatory Visit (INDEPENDENT_AMBULATORY_CARE_PROVIDER_SITE_OTHER): Payer: Self-pay | Admitting: Internal Medicine
